# Patient Record
Sex: Female | Born: 1960 | Race: Asian | Hispanic: No | Smoking: Never smoker
Health system: Southern US, Community
[De-identification: ages and names within clinical notes are randomized; demographics above are authoritative.]

## PROBLEM LIST (undated history)

## (undated) DIAGNOSIS — IMO0002 Reserved for concepts with insufficient information to code with codable children: Secondary | ICD-10-CM

## (undated) DIAGNOSIS — I10 Essential (primary) hypertension: Secondary | ICD-10-CM

## (undated) DIAGNOSIS — D649 Anemia, unspecified: Secondary | ICD-10-CM

## (undated) DIAGNOSIS — G43909 Migraine, unspecified, not intractable, without status migrainosus: Secondary | ICD-10-CM

## (undated) DIAGNOSIS — Z973 Presence of spectacles and contact lenses: Secondary | ICD-10-CM

## (undated) DIAGNOSIS — M797 Fibromyalgia: Secondary | ICD-10-CM

## (undated) HISTORY — DX: Reserved for concepts with insufficient information to code with codable children: IMO0002

## (undated) HISTORY — PX: OTHER SURGICAL HISTORY: SHX169

## (undated) HISTORY — PX: COLONOSCOPY W/ POLYPECTOMY: SHX1380

## (undated) HISTORY — DX: Anemia, unspecified: D64.9

## (undated) HISTORY — DX: Presence of spectacles and contact lenses: Z97.3

## (undated) HISTORY — PX: APPENDECTOMY: SHX54

## (undated) HISTORY — DX: Migraine, unspecified, not intractable, without status migrainosus: G43.909

## (undated) HISTORY — PX: TUBAL LIGATION: SHX77

## (undated) HISTORY — DX: Fibromyalgia: M79.7

---

## 1999-04-08 ENCOUNTER — Ambulatory Visit (HOSPITAL_COMMUNITY): Admission: RE | Admit: 1999-04-08 | Discharge: 1999-04-08 | Payer: Self-pay | Admitting: Gastroenterology

## 2000-03-24 ENCOUNTER — Encounter: Admission: RE | Admit: 2000-03-24 | Discharge: 2000-03-24 | Payer: Self-pay | Admitting: Family Medicine

## 2000-03-24 ENCOUNTER — Encounter: Payer: Self-pay | Admitting: Family Medicine

## 2000-03-31 ENCOUNTER — Encounter: Payer: Self-pay | Admitting: Family Medicine

## 2000-03-31 ENCOUNTER — Encounter (INDEPENDENT_AMBULATORY_CARE_PROVIDER_SITE_OTHER): Payer: Self-pay | Admitting: Specialist

## 2000-03-31 ENCOUNTER — Encounter: Admission: RE | Admit: 2000-03-31 | Discharge: 2000-03-31 | Payer: Self-pay | Admitting: Family Medicine

## 2000-03-31 ENCOUNTER — Other Ambulatory Visit: Admission: RE | Admit: 2000-03-31 | Discharge: 2000-03-31 | Payer: Self-pay | Admitting: Family Medicine

## 2000-04-19 ENCOUNTER — Encounter: Payer: Self-pay | Admitting: Family Medicine

## 2000-04-19 ENCOUNTER — Encounter: Admission: RE | Admit: 2000-04-19 | Discharge: 2000-04-19 | Payer: Self-pay | Admitting: Family Medicine

## 2000-08-10 ENCOUNTER — Encounter: Payer: Self-pay | Admitting: Family Medicine

## 2000-08-10 ENCOUNTER — Encounter: Admission: RE | Admit: 2000-08-10 | Discharge: 2000-08-10 | Payer: Self-pay | Admitting: Family Medicine

## 2001-09-06 ENCOUNTER — Ambulatory Visit (HOSPITAL_COMMUNITY): Admission: RE | Admit: 2001-09-06 | Discharge: 2001-09-06 | Payer: Self-pay | Admitting: Gastroenterology

## 2002-03-22 ENCOUNTER — Encounter: Admission: RE | Admit: 2002-03-22 | Discharge: 2002-03-22 | Payer: Self-pay | Admitting: Family Medicine

## 2002-03-22 ENCOUNTER — Encounter: Payer: Self-pay | Admitting: Family Medicine

## 2004-02-23 ENCOUNTER — Ambulatory Visit (HOSPITAL_COMMUNITY): Admission: RE | Admit: 2004-02-23 | Discharge: 2004-02-23 | Payer: Self-pay | Admitting: Family Medicine

## 2004-03-19 ENCOUNTER — Ambulatory Visit (HOSPITAL_COMMUNITY): Admission: RE | Admit: 2004-03-19 | Discharge: 2004-03-19 | Payer: Self-pay | Admitting: Neurology

## 2004-04-07 ENCOUNTER — Encounter: Admission: RE | Admit: 2004-04-07 | Discharge: 2004-07-05 | Payer: Self-pay | Admitting: Neurology

## 2006-04-12 ENCOUNTER — Ambulatory Visit (HOSPITAL_COMMUNITY): Admission: RE | Admit: 2006-04-12 | Discharge: 2006-04-12 | Payer: Self-pay | Admitting: Family Medicine

## 2006-04-28 ENCOUNTER — Encounter: Admission: RE | Admit: 2006-04-28 | Discharge: 2006-04-28 | Payer: Self-pay | Admitting: Neurology

## 2007-11-08 ENCOUNTER — Encounter: Admission: RE | Admit: 2007-11-08 | Discharge: 2007-11-08 | Payer: Self-pay | Admitting: Family Medicine

## 2007-11-09 ENCOUNTER — Encounter: Admission: RE | Admit: 2007-11-09 | Discharge: 2007-11-09 | Payer: Self-pay | Admitting: Family Medicine

## 2007-11-09 ENCOUNTER — Encounter (INDEPENDENT_AMBULATORY_CARE_PROVIDER_SITE_OTHER): Payer: Self-pay | Admitting: Diagnostic Radiology

## 2007-11-14 ENCOUNTER — Other Ambulatory Visit: Admission: RE | Admit: 2007-11-14 | Discharge: 2007-11-14 | Payer: Self-pay | Admitting: Family Medicine

## 2009-12-01 ENCOUNTER — Ambulatory Visit: Payer: Self-pay | Admitting: Family Medicine

## 2009-12-01 DIAGNOSIS — R51 Headache: Secondary | ICD-10-CM

## 2009-12-01 DIAGNOSIS — E119 Type 2 diabetes mellitus without complications: Secondary | ICD-10-CM | POA: Insufficient documentation

## 2009-12-01 DIAGNOSIS — G43909 Migraine, unspecified, not intractable, without status migrainosus: Secondary | ICD-10-CM | POA: Insufficient documentation

## 2009-12-01 DIAGNOSIS — R519 Headache, unspecified: Secondary | ICD-10-CM | POA: Insufficient documentation

## 2010-07-27 NOTE — Assessment & Plan Note (Signed)
Summary: L eye pain, red, irritated x 1 dy rm 3   Vital Signs:  Patient Profile:   50 Years Old Female CC:      Eyes - red, irritated, painful Height:     62 inches Weight:      182 pounds O2 Sat:      100 % O2 treatment:    Room Air Temp:     97.3 degrees F oral Pulse rate:   87 / minute Pulse rhythm:   regular Resp:     16 per minute BP sitting:   135 / 87  (right arm) Cuff size:   regular  Vitals Entered By: Areta Haber CMA (December 01, 2009 11:53 AM)                  Current Allergies: No known allergies History of Present Illness Chief Complaint: Eyes - red, irritated, painful History of Present Illness: 50yo female c/o red and mild-mod irritated left eye, started yesterday.  She doesn't recall what could have caused the problem.  No h/o flying debris or other trauma.  She wears glass, but not contacts.  No exposure to children or other sick individuals.  The left eye was crusty, red, and irritated this morning and now the right eye is starting to feel the same way.  No other URI symptoms or other illnesses at this point.  No OTC treatment.  Current Problems: CONJUNCTIVITIS (ICD-372.30) FAMILY HISTORY DIABETES 1ST DEGREE RELATIVE (ICD-V18.0) HEADACHE (ICD-784.0) DIABETES MELLITUS, TYPE II (ICD-250.00)   Current Meds TOPAMAX 25 MG TABS (TOPIRAMATE) 1 tab by mouth two times a day * LODINE MG as needed CILOXAN 0.3 % SOLN (CIPROFLOXACIN HCL) 1-2 gtt every 3-6 hours for 10 days in both eyes  REVIEW OF SYSTEMS Constitutional Symptoms      Denies fever, chills, night sweats, weight loss, weight gain, and fatigue.  Eyes       Complains of eye pain and eye drainage.      Denies change in vision, glasses, contact lenses, and eye surgery.      Comments: L eye x 1 dy Ear/Nose/Throat/Mouth       Denies hearing loss/aids, change in hearing, ear pain, ear discharge, dizziness, frequent runny nose, frequent nose bleeds, sinus problems, sore throat, hoarseness, and tooth pain  or bleeding.  Respiratory       Denies dry cough, productive cough, wheezing, shortness of breath, asthma, bronchitis, and emphysema/COPD.  Cardiovascular       Denies murmurs, chest pain, and tires easily with exhertion.    Gastrointestinal       Denies stomach pain, nausea/vomiting, diarrhea, constipation, blood in bowel movements, and indigestion. Genitourniary       Denies painful urination, kidney stones, and loss of urinary control. Neurological       Denies paralysis, seizures, and fainting/blackouts. Musculoskeletal       Denies muscle pain, joint pain, joint stiffness, decreased range of motion, redness, swelling, muscle weakness, and gout.  Skin       Denies bruising, unusual mles/lumps or sores, and hair/skin or nail changes.  Psych       Denies mood changes, temper/anger issues, anxiety/stress, speech problems, depression, and sleep problems.  Past History:  Past Medical History: Diabetes mellitus, type II Headache Fibromyalgia  Past Surgical History: Denies surgical history  Family History: Family History Diabetes 1st degree relative Family History Other cancer Family History of Cardiovascular disorder  Social History: Widow/Widower Never Smoked Alcohol use-no Drug use-no Regular  exercise-yes Smoking Status:  never Drug Use:  no Does Patient Exercise:  yes Physical Exam General appearance: well developed, well nourished, no acute distress Head: normocephalic, atraumatic Eyes: Right eye normal.  Left eye with injected conjunctiva and partial scleral erythema, PERRLA, EOMI, no foreign bodies Chest/Lungs: no rales, wheezes, or rhonchi bilateral, breath sounds equal without effort Heart: regular rate and  rhythm, no murmur Assessment New Problems: CONJUNCTIVITIS (ICD-372.30) FAMILY HISTORY DIABETES 1ST DEGREE RELATIVE (ICD-V18.0) HEADACHE (ICD-784.0) DIABETES MELLITUS, TYPE II (ICD-250.00)   Plan New Orders: New Patient Level III [99203] Planning  Comments:   Avoid rubbing eyes, throw away eye makeup, wash hands frequently as it is very contagious, warm compresses  Follow Up: Follow up in 2-3 days if no improvement Work Excuse: no work today  The patient and/or caregiver has been counseled thoroughly with regard to medications prescribed including dosage, schedule, interactions, rationale for use, and possible side effects and they verbalize understanding.  Diagnoses and expected course of recovery discussed and will return if not improved as expected or if the condition worsens. Patient and/or caregiver verbalized understanding.   Orders Added: 1)  New Patient Level III [91478]

## 2010-11-12 NOTE — Procedures (Signed)
[place]  Patient:    Samantha Price, Samantha Price Visit Number: [id number] MRN: [id number]          Service Type: END Location: ENDO Attending Physician:  Orland Mustard Dictated by:   Llana Aliment. Randa Evens, M.D. Proc. Date: 09/06/01 Admit Date:  09/06/2001   CC:         Consuella Lose C. Valentina Lucks, M.D.   Procedure Report  DATE OF BIRTH:  [birth year]  PROCEDURE:  Colonoscopy.  SURGEON:  James L. Edwards, M.D.  MEDICATIONS:  Fentanyl 75 mcg and Versed 7 mg IV.  SCOPE:  Pediatric Olympus colonoscope.  INDICATIONS:  Previous history of multiple colon polyps while living in [place].  DESCRIPTION OF PROCEDURE:  The procedure had been explained to the patient and consent obtained.  With the patient in the left lateral decubitus position, the Olympus colonoscope was inserted and advanced under direct visualization. The scope was withdrawn and the cecum, ascending colon, hepatic flexure, transverse colon, splenic flexure, descending, and sigmoid colon were seen well upon removal.  No polyps were seen.  The patient was tolerated the procedure well and was maintained on low flow oxygen and pulse oximeter throughout the procedure.  ASSESSMENT:  No evidence of further polyps.  PLAN:  Recommend repeating in three years given her previous history of polyps. Dictated by:   Llana Aliment. Randa Evens, M.D. Attending Physician:  Orland Mustard DD:  09/06/01 TD:  09/07/01 Job: [id number]

## 2010-11-15 ENCOUNTER — Other Ambulatory Visit: Payer: Self-pay | Admitting: Emergency Medicine

## 2010-11-15 ENCOUNTER — Encounter: Payer: Self-pay | Admitting: Emergency Medicine

## 2010-11-15 ENCOUNTER — Telehealth (INDEPENDENT_AMBULATORY_CARE_PROVIDER_SITE_OTHER): Payer: Self-pay | Admitting: *Deleted

## 2010-11-15 ENCOUNTER — Inpatient Hospital Stay (INDEPENDENT_AMBULATORY_CARE_PROVIDER_SITE_OTHER)
Admission: RE | Admit: 2010-11-15 | Discharge: 2010-11-15 | Disposition: A | Discharge status: Home / Self Care | Payer: BC Managed Care – PPO | Source: Ambulatory Visit | Attending: Emergency Medicine | Admitting: Emergency Medicine

## 2010-11-15 ENCOUNTER — Ambulatory Visit
Admission: RE | Admit: 2010-11-15 | Discharge: 2010-11-15 | Disposition: A | Discharge status: Home / Self Care | Payer: BC Managed Care – PPO | Source: Ambulatory Visit | Attending: Emergency Medicine | Admitting: Emergency Medicine

## 2010-11-15 DIAGNOSIS — D492 Neoplasm of unspecified behavior of bone, soft tissue, and skin: Secondary | ICD-10-CM | POA: Insufficient documentation

## 2010-11-19 ENCOUNTER — Other Ambulatory Visit (INDEPENDENT_AMBULATORY_CARE_PROVIDER_SITE_OTHER): Payer: Self-pay | Admitting: Surgery

## 2010-11-19 DIAGNOSIS — IMO0002 Reserved for concepts with insufficient information to code with codable children: Secondary | ICD-10-CM

## 2010-11-20 ENCOUNTER — Ambulatory Visit
Admission: RE | Admit: 2010-11-20 | Discharge: 2010-11-20 | Disposition: A | Discharge status: Home / Self Care | Payer: BC Managed Care – PPO | Source: Ambulatory Visit | Attending: Surgery | Admitting: Surgery

## 2010-11-20 DIAGNOSIS — IMO0002 Reserved for concepts with insufficient information to code with codable children: Secondary | ICD-10-CM

## 2010-11-22 ENCOUNTER — Encounter: Payer: Self-pay | Admitting: Family Medicine

## 2010-11-24 ENCOUNTER — Other Ambulatory Visit (INDEPENDENT_AMBULATORY_CARE_PROVIDER_SITE_OTHER): Payer: Self-pay | Admitting: Surgery

## 2010-11-24 DIAGNOSIS — IMO0002 Reserved for concepts with insufficient information to code with codable children: Secondary | ICD-10-CM

## 2010-11-26 ENCOUNTER — Ambulatory Visit (INDEPENDENT_AMBULATORY_CARE_PROVIDER_SITE_OTHER): Payer: BC Managed Care – PPO | Admitting: Family Medicine

## 2010-11-26 ENCOUNTER — Encounter: Payer: Self-pay | Admitting: Family Medicine

## 2010-11-26 DIAGNOSIS — Z1231 Encounter for screening mammogram for malignant neoplasm of breast: Secondary | ICD-10-CM

## 2010-11-26 DIAGNOSIS — G43909 Migraine, unspecified, not intractable, without status migrainosus: Secondary | ICD-10-CM

## 2010-11-26 DIAGNOSIS — I10 Essential (primary) hypertension: Secondary | ICD-10-CM

## 2010-11-26 DIAGNOSIS — E119 Type 2 diabetes mellitus without complications: Secondary | ICD-10-CM

## 2010-11-26 DIAGNOSIS — Z23 Encounter for immunization: Secondary | ICD-10-CM

## 2010-11-26 DIAGNOSIS — Z1211 Encounter for screening for malignant neoplasm of colon: Secondary | ICD-10-CM

## 2010-11-26 MED ORDER — TOPIRAMATE 25 MG PO CPSP: 25.0000 mg | ORAL_CAPSULE | Freq: Two times a day (BID) | ORAL | Status: DC

## 2010-11-26 MED ORDER — LISINOPRIL-HYDROCHLOROTHIAZIDE 10-12.5 MG PO TABS: 1.0000 | ORAL_TABLET | Freq: Every day | ORAL | Status: DC

## 2010-11-26 NOTE — Assessment & Plan Note (Addendum)
New dx. Will start medication.Discussed potential SE of the medication. Start lisinopril HCT. Can start with half tab for the first week if nervous about it.  Follow up in 1 months. Discussed the DASH diet. Work on weight loss and regular exercise. Due for screening labs and check TSH to make sure no hypothyroid.

## 2010-11-26 NOTE — Progress Notes (Signed)
Subjective:    Patient ID: Samantha Price, female    DOB: 07/19/1960, 50 y.o.   MRN: [id number]  HPI  Samantha Price to UC for some pain and for a lump on her right upper thigh, that has been getting larger. She is getting an MRI per the surgeon for this but they noted that her BP was high at the UC.  Has been under a lot stress for the last 3 years.  Father died of cancer and mom died in accident.  Husband died in 11/16/2008 from suicide. Did do some counseling for awhile but recently started back to work.  She has gained about 40 -50 lbs in the labs in the last few years.   Hx of diet controlled diabetes.  She is getting labs drawn tomorrow to have her MRI done.  Her last mammo was in Nov 17, 2007, last colonoscopy was in Nov 16, 2005 and has hx of polyps so was supposed to repeat in 5 years. Used to see Dr. Randa Evens but would like to find someone locally.    She needs a refill on her topomax for he rmigraines.   Review of Systems  Constitutional: Negative for fever, diaphoresis and unexpected weight change.  HENT: Negative for hearing loss, rhinorrhea and tinnitus.   Eyes: Negative for visual disturbance.  Respiratory: Negative for cough and wheezing.   Cardiovascular: Negative for chest pain and palpitations.  Gastrointestinal: Negative for nausea, vomiting, diarrhea and blood in stool.  Genitourinary: Negative for vaginal bleeding, vaginal discharge and difficulty urinating.  Musculoskeletal: Negative for myalgias and arthralgias.  Skin: Negative for rash.  Neurological: Positive for headaches.  Hematological: Negative for adenopathy. Bruises/bleeds easily.       [Unexplained lump on the right upper thigh Psychiatric/Behavioral: Negative for sleep disturbance and dysphoric mood. The patient is not nervous/anxious.    BP 156/88  Pulse 74  Ht 5' 1.5" (1.562 m)  Wt 188 lb (85.276 kg)  BMI 34.95 kg/m2    Not on File  Past Medical History  Diagnosis Date  . Diabetes mellitus     type 2  . Migraines     . Fibromyalgia   . Cyst     drained from Rt breast    Past Surgical History  Procedure Date  . Appendectomy   . Breast cyst excison     Left,   . Tubal ligation   . Colonoscopy w/ polypectomy     2005/11/16, Dr. Randa Evens.      History   Social History  . Marital Status: Widowed    Spouse Name: Jeannett Senior     Number of Children: 3  . 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 Smoking status: Never Smoker   . Smokeless tobacco: Not on file  . Alcohol Use: Yes  . Drug Use: Yes  . Sexually Active: Not on file     widow, regularly exercises   Other Topics Concern  . Not on file   Social History Narrative   Her youngest daughter lives at home. Has 3 daughters.  Her husband committed suicide in 2008/11/16 from PTSD, and mother died 3 months prior to that.  Mom died in a tragik accident.     Family History  Problem Relation Age of Onset  . Cancer Father     stomach  . Diabetes Mother   . Colon cancer Other     aunt, cousin  .  Heart disease Other     family hisatory  . Leukemia      1st cousin  . Breast cancer      aunt  . Stroke      Grandparent    Current outpatient prescriptions:lisinopril-hydrochlorothiazide (PRINZIDE,ZESTORETIC) 10-12.5 MG per tablet, Take 1 tablet by mouth daily., Disp: 30 tablet, Rfl: 1;  topiramate (TOPAMAX) 25 MG capsule, Take 1 capsule (25 mg total) by mouth 2 (two) times daily., Disp: 60 capsule, Rfl: 6;  DISCONTD: topiramate (TOPAMAX) 25 MG capsule, Take 25 mg by mouth 2 (two) times daily.  , Disp: , Rfl:      Objective:   Physical Exam  Constitutional: She is oriented to person, place, and time. She appears well-developed and well-nourished.  HENT:  Head: Normocephalic and atraumatic.  Eyes: Pupils are equal, round, and reactive to light.  Neck: Neck supple. No thyromegaly present.  Cardiovascular: Normal rate, regular rhythm and normal heart sounds.         No carotid or abdominal bruits.   Pulmonary/Chest: Effort normal and breath sounds normal.  Musculoskeletal: She exhibits no edema.  Lymphadenopathy:    She has no cervical adenopathy.  Neurological: She is alert and oriented to person, place, and time.  Skin: Skin is warm and dry.  Psychiatric: She has a normal mood and affect.          Assessment & Plan:  She is  Very behind in her health maintenance so will refer for mammo, colonosocpy, and given Tdap today.  She also needs screening labs.

## 2010-11-26 NOTE — Assessment & Plan Note (Signed)
Will refill her topamax.

## 2010-11-26 NOTE — Assessment & Plan Note (Addendum)
Due to recheck. Hasn't been checked in a long time. I suspect it is worse since has gained almost 40 lbs.

## 2010-11-26 NOTE — Patient Instructions (Addendum)
Google DASH diet (.nih/gov) Work on regular exercise and low salt and low fat diet We will call you to schedule your mammogram and colonoscopy.

## 2010-11-27 ENCOUNTER — Other Ambulatory Visit: Payer: BC Managed Care – PPO

## 2010-11-27 ENCOUNTER — Ambulatory Visit
Admission: RE | Admit: 2010-11-27 | Discharge: 2010-11-27 | Disposition: A | Discharge status: Home / Self Care | Payer: BC Managed Care – PPO | Source: Ambulatory Visit | Attending: Surgery | Admitting: Surgery

## 2010-11-27 DIAGNOSIS — IMO0002 Reserved for concepts with insufficient information to code with codable children: Secondary | ICD-10-CM

## 2010-11-27 MED ORDER — GADOBENATE DIMEGLUMINE 529 MG/ML IV SOLN
17.0000 mL | Freq: Once | INTRAVENOUS | Status: AC | PRN
  Administered 2010-11-27: 17 mL via INTRAVENOUS

## 2010-11-29 ENCOUNTER — Telehealth: Payer: Self-pay | Admitting: Family Medicine

## 2010-11-29 DIAGNOSIS — Z1231 Encounter for screening mammogram for malignant neoplasm of breast: Secondary | ICD-10-CM

## 2010-11-29 DIAGNOSIS — N6310 Unspecified lump in the right breast, unspecified quadrant: Secondary | ICD-10-CM

## 2010-11-29 NOTE — Telephone Encounter (Signed)
Samantha Price with the [facility] called and pt order placed in "EPIC" entered as screen mammo needs to be changed to Diagnostic bilateral mammo since pt is to be follow up from 2009. Plan:  Routed this message to provider Dr. Marlyne Beards, LPN [facility]

## 2010-11-29 NOTE — Telephone Encounter (Signed)
Ok will change 

## 2010-11-29 NOTE — Telephone Encounter (Signed)
Noted that provider will change the order in the system to the correct test. Jarvis Newcomer, LPN [facility]

## 2010-11-30 ENCOUNTER — Other Ambulatory Visit (INDEPENDENT_AMBULATORY_CARE_PROVIDER_SITE_OTHER): Payer: Self-pay | Admitting: Surgery

## 2010-11-30 ENCOUNTER — Other Ambulatory Visit: Payer: Self-pay | Admitting: Family Medicine

## 2010-11-30 DIAGNOSIS — N6310 Unspecified lump in the right breast, unspecified quadrant: Secondary | ICD-10-CM

## 2010-12-03 ENCOUNTER — Encounter (INDEPENDENT_AMBULATORY_CARE_PROVIDER_SITE_OTHER): Payer: Self-pay | Admitting: Surgery

## 2010-12-08 ENCOUNTER — Ambulatory Visit
Admission: RE | Admit: 2010-12-08 | Discharge: 2010-12-08 | Disposition: A | Discharge status: Home / Self Care | Payer: BC Managed Care – PPO | Source: Ambulatory Visit | Attending: Family Medicine | Admitting: Family Medicine

## 2010-12-08 DIAGNOSIS — N6310 Unspecified lump in the right breast, unspecified quadrant: Secondary | ICD-10-CM

## 2010-12-24 ENCOUNTER — Ambulatory Visit (HOSPITAL_COMMUNITY)
Admission: RE | Admit: 2010-12-24 | Discharge: 2010-12-24 | Disposition: A | Discharge status: Home / Self Care | Payer: BC Managed Care – PPO | Source: Ambulatory Visit | Attending: Orthopaedic Surgery | Admitting: Orthopaedic Surgery

## 2010-12-24 ENCOUNTER — Encounter (HOSPITAL_COMMUNITY)
Admission: RE | Admit: 2010-12-24 | Discharge: 2010-12-24 | Disposition: A | Discharge status: Home / Self Care | Payer: BC Managed Care – PPO | Source: Ambulatory Visit | Attending: Orthopaedic Surgery | Admitting: Orthopaedic Surgery

## 2010-12-24 ENCOUNTER — Other Ambulatory Visit (HOSPITAL_COMMUNITY): Payer: Self-pay | Admitting: Orthopaedic Surgery

## 2010-12-24 DIAGNOSIS — Z01810 Encounter for preprocedural cardiovascular examination: Secondary | ICD-10-CM | POA: Insufficient documentation

## 2010-12-24 DIAGNOSIS — R2240 Localized swelling, mass and lump, unspecified lower limb: Secondary | ICD-10-CM

## 2010-12-24 DIAGNOSIS — Z01818 Encounter for other preprocedural examination: Secondary | ICD-10-CM | POA: Insufficient documentation

## 2010-12-24 DIAGNOSIS — Z01812 Encounter for preprocedural laboratory examination: Secondary | ICD-10-CM | POA: Insufficient documentation

## 2010-12-24 DIAGNOSIS — R229 Localized swelling, mass and lump, unspecified: Secondary | ICD-10-CM | POA: Insufficient documentation

## 2010-12-24 LAB — CBC
HCT: 39.6 % (ref 36.0–46.0)
Hemoglobin: 13.5 g/dL (ref 12.0–15.0)
MCH: 30.8 pg (ref 26.0–34.0)
MCHC: 34.1 g/dL (ref 30.0–36.0)
MCV: 90.4 fL (ref 78.0–100.0)

## 2010-12-24 LAB — BASIC METABOLIC PANEL
BUN: 7 mg/dL (ref 6–23)
Calcium: 9 mg/dL (ref 8.4–10.5)
Creatinine, Ser: 0.61 mg/dL (ref 0.50–1.10)
GFR calc non Af Amer: >60 mL/min (ref >60)
Glucose, Bld: 132 mg/dL — ABNORMAL HIGH (ref 70–99)
Sodium: 140 mEq/L (ref 135–145)

## 2010-12-27 ENCOUNTER — Ambulatory Visit: Payer: BC Managed Care – PPO | Admitting: Family Medicine

## 2010-12-28 ENCOUNTER — Other Ambulatory Visit: Payer: Self-pay | Admitting: Orthopaedic Surgery

## 2010-12-28 ENCOUNTER — Ambulatory Visit (HOSPITAL_COMMUNITY)
Admission: RE | Admit: 2010-12-28 | Discharge: 2010-12-29 | Disposition: A | Discharge status: Home / Self Care | Payer: BC Managed Care – PPO | Source: Ambulatory Visit | Attending: Orthopaedic Surgery | Admitting: Orthopaedic Surgery

## 2010-12-28 DIAGNOSIS — G4733 Obstructive sleep apnea (adult) (pediatric): Secondary | ICD-10-CM | POA: Insufficient documentation

## 2010-12-28 DIAGNOSIS — I10 Essential (primary) hypertension: Secondary | ICD-10-CM | POA: Insufficient documentation

## 2010-12-28 DIAGNOSIS — Z01812 Encounter for preprocedural laboratory examination: Secondary | ICD-10-CM | POA: Insufficient documentation

## 2010-12-28 DIAGNOSIS — D1779 Benign lipomatous neoplasm of other sites: Secondary | ICD-10-CM | POA: Insufficient documentation

## 2010-12-28 DIAGNOSIS — Z01818 Encounter for other preprocedural examination: Secondary | ICD-10-CM | POA: Insufficient documentation

## 2010-12-28 DIAGNOSIS — Z01810 Encounter for preprocedural cardiovascular examination: Secondary | ICD-10-CM | POA: Insufficient documentation

## 2010-12-28 DIAGNOSIS — E119 Type 2 diabetes mellitus without complications: Secondary | ICD-10-CM | POA: Insufficient documentation

## 2010-12-28 LAB — GLUCOSE, CAPILLARY

## 2010-12-29 LAB — GLUCOSE, CAPILLARY
Glucose-Capillary: 138 mg/dL — ABNORMAL HIGH (ref 70–99)
Glucose-Capillary: 120 mg/dL — ABNORMAL HIGH (ref 70–99)

## 2010-12-31 NOTE — Op Note (Signed)
  [name], CAST           ACCOUNT NO.:  [id number]  MEDICAL RECORD NO.:  [id number]  LOCATION:  5013                         FACILITY:  [facility]  PHYSICIAN:  Vanita Panda. Magnus Ivan, M.D.DATE OF BIRTH:  09/28/1960  DATE OF PROCEDURE:  12/28/2010 DATE OF DISCHARGE:                              OPERATIVE REPORT   PREOPERATIVE DIAGNOSIS:  Large right thigh intramuscular mass.  POSTOPERATIVE DIAGNOSIS:  Large right thigh intramuscular mass.  PROCEDURE:  Excision of large and deep right thigh intramuscular mass.  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  SURGEON:  Vanita Panda. Magnus Ivan, MD  ANESTHESIA: 1. General. 2. Local 0.25% plain Sensorcaine.  ANTIBIOTICS:  2 g IV Ancef.  ESTIMATED BLOOD LOSS:  50 mL.  COMPLICATIONS:  None.  INDICATIONS:  [name] is a very pleasant 50 year old female with a slowly growing mass deep in her right thigh.  She eventually had an MRI obtained, which showed findings consistent with a intramuscular lipoma that was rather large.  This was slowly growing and this was becoming painful as well.  She wished to have this removed.  The risks and benefits of surgery were explained to her in detail and she did wish to proceed with surgery.  PROCEDURE DESCRIPTION:  After informed consent was obtained, appropriate right thigh was marked.  She was brought to the operating room placed supine on the operating table.  General anesthesia was then obtained, we then prepped her right thigh from the lower abdomen groin area all the way down to the ankle with DuraPrep and sterile drapes.  A sterile stockinette was placed as well. A time-out was called to identify the correct patient and the correct right thigh.  I then made an incision. The anterior medial aspect of the thigh midway down centered over where I felt the lipoma resided.  I dissected down to the intramuscular plain. I was able to identify the  vastus medialis as well as the sartorius and as I got in to the subfascial plains.  I found a large lipomas appearing mass.  We were able to meticulously dissect around this mass in its entirety and remove it with leaving no gross lipomas appearing tissue remaining.  This did appear to be consistent with benign lipoma.  We did sent for frozen section and the pathologist stated that it was difficult to freeze and the gross findings consistent with lipoma.  The permanent specimen was then sent to follow.  I then copiously irrigated the tissue.  Hemostasis was obtained with electrocautery.  I closed the deep tissue with interrupted 0 Vicryl  suture followed by 2-0 Vicryl  and subcutaneous tissue and a 4-0 running Monocryl subcuticular stitch.  The incision was then infiltrated with 0.25% plain Sensorcaine.  I then placed a Steri- Strips and well-padded sterile dressing.  The patient was awakened, extubated, and taken to recovery room in stable condition.  All final counts were correct and there were no complications noted.     Vanita Panda. Magnus Ivan, M.D.     CYB/MEDQ  D:  12/28/2010  T:  12/29/2010  Job:  [id number]  Electronically Signed by Doneen Poisson M.D. on 12/31/2010 06:30:37 PM

## 2010-12-31 NOTE — H&P (Signed)
  [name], Samantha Price           ACCOUNT NO.:  [id number]  MEDICAL RECORD NO.:  [id number]  LOCATION:  5013                         FACILITY:  [facility]  PHYSICIAN:  Vanita Panda. Magnus Ivan, M.D.DATE OF BIRTH:  [birth year]  DATE OF ADMISSION:  12/28/2010 DATE OF DISCHARGE:  12/24/2010                             HISTORY & PHYSICAL   CHIEF COMPLAINT:  Swelling and expanding right thigh mass.  HISTORY AND PHYSICAL:  Ms. Samantha Price is a very pleasant 50 year old female with a right thigh mass.  She was referred from Dr. Manus Rudd of [facility] who evaluated this and MRI was consistent with what appear to be a benign large lipoma.  She states she feels like it is getting bigger.  She says it does hurt as well.  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  PAST MEDICAL HISTORY:  Diabetes that is well controlled and high blood pressure.  ALLERGIES:  No known drug allergies.  CURRENT MEDICATIONS:  Topamax, multivitamin, and aspirin.  SOCIAL HISTORY:  She is widowed as well.  She has a daughter who is a Holiday representative at [employer] and plays soccer.  REVIEW OF SYSTEMS:  Negative for chest pain, shortness of breath, fevers, chills, nausea, or vomiting.  PHYSICAL EXAMINATION:  VITAL SIGNS:  She is afebrile.  Stable vital signs. GENERAL:  She is alert and oriented x3, in no acute distress,  but in obvious discomfort. HEENT:  Normocephalic and atraumatic.  Pupils equal, round, and reactive to light.  Extraocular muscles intact. NECK:  Supple. LUNGS:  Clear to auscultation bilaterally. HEART:  Regular rate and rhythm. ABDOMEN:  Benign. EXTREMITIES:  Right leg does shows some differentiation in spasm comparing Samantha right and left side.  There is no warmth.  I can easily palpate this large mass in Samantha anterior compartment of Samantha thigh just medial to Samantha midline.  She is neurovascularly intact.  MRIs reviewed and  is reviewed thoroughly and it shows there is a large expansile mass of Samantha anterior compartment looks to be associated with vastus medialis muscle around Samantha vastus intermedius.  It is consistent with surrounding fat tissue and is homogenous signal throughout consistent with a benign process such as a lipoma.  It is close to Samantha neurovascular bundle as well.  ASSESSMENT:  Large right benign appearing thigh mass.  PLAN:  We are going to proceed with surgery today to excise this.  We will take our time doing this to hopefully get __________ good margins. We will send for frozen section and then a permanent section as well. She understands Samantha risks and benefits of this and does wish to proceed with surgery.     Vanita Panda. Magnus Ivan, M.D.     CYB/MEDQ  D:  12/28/2010  T:  12/28/2010  Job:  [id number]  Electronically Signed by Doneen Poisson M.D. on 12/31/2010 06:30:30 PM

## 2011-01-05 ENCOUNTER — Other Ambulatory Visit: Payer: Self-pay | Admitting: Orthopaedic Surgery

## 2011-01-05 ENCOUNTER — Ambulatory Visit
Admission: RE | Admit: 2011-01-05 | Discharge: 2011-01-05 | Disposition: A | Discharge status: Home / Self Care | Payer: BC Managed Care – PPO | Source: Ambulatory Visit | Attending: Orthopaedic Surgery | Admitting: Orthopaedic Surgery

## 2011-01-05 DIAGNOSIS — R609 Edema, unspecified: Secondary | ICD-10-CM

## 2011-02-08 ENCOUNTER — Ambulatory Visit: Payer: BC Managed Care – PPO | Attending: Orthopaedic Surgery | Admitting: Physical Therapy

## 2011-02-08 DIAGNOSIS — M2550 Pain in unspecified joint: Secondary | ICD-10-CM | POA: Insufficient documentation

## 2011-02-08 DIAGNOSIS — M6281 Muscle weakness (generalized): Secondary | ICD-10-CM | POA: Insufficient documentation

## 2011-02-08 DIAGNOSIS — IMO0001 Reserved for inherently not codable concepts without codable children: Secondary | ICD-10-CM | POA: Insufficient documentation

## 2011-02-08 DIAGNOSIS — M2560 Stiffness of unspecified joint, not elsewhere classified: Secondary | ICD-10-CM | POA: Insufficient documentation

## 2011-02-08 DIAGNOSIS — R262 Difficulty in walking, not elsewhere classified: Secondary | ICD-10-CM | POA: Insufficient documentation

## 2011-02-09 ENCOUNTER — Ambulatory Visit: Payer: BC Managed Care – PPO | Admitting: Physical Therapy

## 2011-02-10 ENCOUNTER — Ambulatory Visit: Payer: BC Managed Care – PPO | Admitting: Physical Therapy

## 2011-02-14 ENCOUNTER — Ambulatory Visit: Payer: BC Managed Care – PPO | Admitting: Physical Therapy

## 2011-02-16 ENCOUNTER — Ambulatory Visit: Payer: BC Managed Care – PPO | Admitting: Physical Therapy

## 2011-02-18 ENCOUNTER — Ambulatory Visit: Payer: BC Managed Care – PPO | Admitting: Physical Therapy

## 2011-02-21 ENCOUNTER — Ambulatory Visit: Payer: BC Managed Care – PPO | Admitting: Physical Therapy

## 2011-02-23 ENCOUNTER — Encounter: Payer: BC Managed Care – PPO | Admitting: Physical Therapy

## 2011-02-24 ENCOUNTER — Encounter: Payer: BC Managed Care – PPO | Admitting: Physical Therapy

## 2011-02-25 ENCOUNTER — Encounter: Payer: BC Managed Care – PPO | Admitting: Physical Therapy

## 2011-03-02 ENCOUNTER — Ambulatory Visit: Payer: BC Managed Care – PPO | Attending: Orthopaedic Surgery | Admitting: Physical Therapy

## 2011-03-02 DIAGNOSIS — R262 Difficulty in walking, not elsewhere classified: Secondary | ICD-10-CM | POA: Insufficient documentation

## 2011-03-02 DIAGNOSIS — IMO0001 Reserved for inherently not codable concepts without codable children: Secondary | ICD-10-CM | POA: Insufficient documentation

## 2011-03-02 DIAGNOSIS — M6281 Muscle weakness (generalized): Secondary | ICD-10-CM | POA: Insufficient documentation

## 2011-03-02 DIAGNOSIS — M2560 Stiffness of unspecified joint, not elsewhere classified: Secondary | ICD-10-CM | POA: Insufficient documentation

## 2011-03-02 DIAGNOSIS — M2550 Pain in unspecified joint: Secondary | ICD-10-CM | POA: Insufficient documentation

## 2011-03-03 ENCOUNTER — Encounter: Payer: BC Managed Care – PPO | Admitting: Physical Therapy

## 2011-03-09 ENCOUNTER — Encounter: Payer: BC Managed Care – PPO | Admitting: Physical Therapy

## 2011-03-10 ENCOUNTER — Ambulatory Visit: Payer: BC Managed Care – PPO | Admitting: Physical Therapy

## 2011-03-14 ENCOUNTER — Ambulatory Visit: Payer: BC Managed Care – PPO | Admitting: Physical Therapy

## 2011-03-17 ENCOUNTER — Encounter: Payer: BC Managed Care – PPO | Admitting: Physical Therapy

## 2011-03-24 ENCOUNTER — Encounter: Payer: BC Managed Care – PPO | Admitting: Physical Therapy

## 2011-04-08 ENCOUNTER — Ambulatory Visit: Payer: BC Managed Care – PPO | Attending: Orthopaedic Surgery | Admitting: Physical Therapy

## 2011-04-08 DIAGNOSIS — M2560 Stiffness of unspecified joint, not elsewhere classified: Secondary | ICD-10-CM | POA: Insufficient documentation

## 2011-04-08 DIAGNOSIS — R262 Difficulty in walking, not elsewhere classified: Secondary | ICD-10-CM | POA: Insufficient documentation

## 2011-04-08 DIAGNOSIS — IMO0001 Reserved for inherently not codable concepts without codable children: Secondary | ICD-10-CM | POA: Insufficient documentation

## 2011-04-08 DIAGNOSIS — M6281 Muscle weakness (generalized): Secondary | ICD-10-CM | POA: Insufficient documentation

## 2011-04-08 DIAGNOSIS — M2550 Pain in unspecified joint: Secondary | ICD-10-CM | POA: Insufficient documentation

## 2011-04-13 ENCOUNTER — Ambulatory Visit: Payer: BC Managed Care – PPO | Admitting: Physical Therapy

## 2011-04-15 ENCOUNTER — Ambulatory Visit: Payer: BC Managed Care – PPO | Admitting: Physical Therapy

## 2011-04-20 ENCOUNTER — Ambulatory Visit: Payer: BC Managed Care – PPO | Admitting: Physical Therapy

## 2011-04-25 ENCOUNTER — Ambulatory Visit: Payer: BC Managed Care – PPO | Admitting: Physical Therapy

## 2011-04-28 ENCOUNTER — Ambulatory Visit: Payer: BC Managed Care – PPO | Attending: Orthopaedic Surgery | Admitting: Physical Therapy

## 2011-04-28 DIAGNOSIS — M2560 Stiffness of unspecified joint, not elsewhere classified: Secondary | ICD-10-CM | POA: Insufficient documentation

## 2011-04-28 DIAGNOSIS — M6281 Muscle weakness (generalized): Secondary | ICD-10-CM | POA: Insufficient documentation

## 2011-04-28 DIAGNOSIS — R262 Difficulty in walking, not elsewhere classified: Secondary | ICD-10-CM | POA: Insufficient documentation

## 2011-04-28 DIAGNOSIS — IMO0001 Reserved for inherently not codable concepts without codable children: Secondary | ICD-10-CM | POA: Insufficient documentation

## 2011-04-28 DIAGNOSIS — M2550 Pain in unspecified joint: Secondary | ICD-10-CM | POA: Insufficient documentation

## 2011-05-30 NOTE — Telephone Encounter (Signed)
  Phone Note Outgoing Call   Call placed by: Lajean Saver RN,  Nov 15, 2010 11:23 AM Call placed to: [facility] Action Taken: Appt scheduled Summary of Call: Scheduled to see Dr. Corliss Skains with [facility] on 11/19/10 @ 10am in [facility] office. Patient notifed of appointment.

## 2011-05-30 NOTE — Progress Notes (Signed)
Summary: LUMP ON RIGHT THIGH?   Vital Signs:  Patient Profile:   50 Years Old Female CC:      Lump to right thigh x 3 months Height:     62 inches Weight:      185 pounds O2 Sat:      96 % O2 treatment:    Room Air Temp:     98.8 degrees F oral Pulse rate:   67 / minute Resp:     16 per minute BP sitting:   171 / 92  (left arm) Cuff size:   large  Pt. in pain?   yes    Location:   right thigh    Type:       sore  Vitals Entered By: Lajean Saver RN (Nov 15, 2010 10:02 AM)                   Updated Prior Medication List: TOPAMAX 25 MG TABS (TOPIRAMATE) 1 tab by mouth two times a day  Current Allergies: No known allergies History of Present Illness History from: patient Chief Complaint: Lump to right thigh x 3 months History of Present Illness: She has had a lump on her R thigh for 3-4 months.  Started small and has seemed to enlarge over time.  It wasn't bothering her, but now she is noticing it to be sore when standing a long time or with activity.  No F/C/N/V.  Not using any heat or cold.  She does have history of multiple breast masses that were biopsied, all were benign but one of which was "indeterminate".    REVIEW OF SYSTEMS Constitutional Symptoms      Denies fever, chills, night sweats, weight loss, weight gain, and fatigue.  Eyes       Denies change in vision, eye pain, eye discharge, glasses, contact lenses, and eye surgery. Ear/Nose/Throat/Mouth       Denies hearing loss/aids, change in hearing, ear pain, ear discharge, dizziness, frequent runny nose, frequent nose bleeds, sinus problems, sore throat, hoarseness, and tooth pain or bleeding.  Respiratory       Denies dry cough, productive cough, wheezing, shortness of breath, asthma, bronchitis, and emphysema/COPD.  Cardiovascular       Denies murmurs, chest pain, and tires easily with exhertion.    Gastrointestinal       Denies stomach pain, nausea/vomiting, diarrhea, constipation, blood in bowel  movements, and indigestion. Genitourniary       Denies painful urination, kidney stones, and loss of urinary control. Neurological       Denies paralysis, seizures, and fainting/blackouts. Musculoskeletal       Denies muscle pain, joint pain, joint stiffness, decreased range of motion, redness, swelling, muscle weakness, and gout.  Skin       Complains of unusual moles/lumps or sores.      Denies bruising and hair/skin or nail changes.      Comments: right upper thigh Psych       Denies mood changes, temper/anger issues, anxiety/stress, speech problems, depression, and sleep problems. Other Comments: Patient c/o lump on right thigh that started early in the year. It is growing in size and increasing in pain. There is no discoloration. She has no seen PCP, wants to find a new doctor. Patient given a card for [facility].   Past History:  Past Medical History: Diabetes mellitus, type II Migraines Fibromyalgia Cyst drained from right breast  Past Surgical History:  Appendectomy  Family History: Family History Diabetes  1st degree relative Family History Other cancer Family History of Cardiovascular disorder Father- stomach CA First cousin with Leukemia  Social History: Widow Never Smoked Alcohol use-no Drug use-no Regular exercise-yes Physical Exam General appearance: well developed, well nourished, no acute distress MSE: oriented to time, place, and person R leg/femur: FROM of hip and knee.  Mildly visible enlargement medially about mid-thigh.  Soft rubbery-feeling mass approx 4cm in diamter, mildly tender to palpation.  No pulsations, no warmth, no signs of infection. Assessment New Problems: NEOPLASMS UNSPEC NATURE BONE SOFT TISSUE&SKIN (ICD-239.2)   Plan New Orders: Est. Patient Level IV [16109] T-DG Femur*R* [73550] Planning Comments:   Obtained an Xray to see if we can see anything like phleboliths or bony involvement.  I believe this is likely a lipoma.   The Xray is read by radiology as normal other than mild knee effusion.  Nevertheless, with her history, should likely be worked up further by a Development worker, international aid (excision or biopsy) and perhaps ultrasound to see it better.  Will set the patient up with [facility] this week.   Pt to establish with [place] to discuss her HTN and to schedule a f/u for a mammogram.  ER precautions for CP, SOB, visual changes, dizziness, etc.   The patient and/or caregiver has been counseled thoroughly with regard to medications prescribed including dosage, schedule, interactions, rationale for use, and possible side effects and they verbalize understanding.  Diagnoses and expected course of recovery discussed and will return if not improved as expected or if the condition worsens. Patient and/or caregiver verbalized understanding.   Orders Added: 1)  Est. Patient Level IV [60454] 2)  T-DG Femur*R* [73550]

## 2011-08-29 ENCOUNTER — Other Ambulatory Visit: Payer: Self-pay | Admitting: *Deleted

## 2011-08-29 MED ORDER — LISINOPRIL-HYDROCHLOROTHIAZIDE 10-12.5 MG PO TABS: 1.0000 | ORAL_TABLET | Freq: Every day | ORAL | Status: DC

## 2011-10-10 ENCOUNTER — Ambulatory Visit (INDEPENDENT_AMBULATORY_CARE_PROVIDER_SITE_OTHER): Payer: BC Managed Care – PPO | Admitting: Physician Assistant

## 2011-10-10 ENCOUNTER — Encounter: Payer: Self-pay | Admitting: Physician Assistant

## 2011-10-10 ENCOUNTER — Telehealth: Payer: Self-pay | Admitting: *Deleted

## 2011-10-10 VITALS — BP 165/92 | HR 68 | Temp 98.5°F | Ht 61.5 in | Wt 192.0 lb

## 2011-10-10 DIAGNOSIS — H1010 Acute atopic conjunctivitis, unspecified eye: Secondary | ICD-10-CM

## 2011-10-10 DIAGNOSIS — I10 Essential (primary) hypertension: Secondary | ICD-10-CM

## 2011-10-10 MED ORDER — OLOPATADINE HCL 0.2 % OP SOLN: 1.0000 [drp] | Freq: Once | OPHTHALMIC | Status: DC

## 2011-10-10 MED ORDER — FLUTICASONE PROPIONATE 50 MCG/ACT NA SUSP: 2.0000 | Freq: Every day | NASAL | Status: DC

## 2011-10-10 MED ORDER — MOMETASONE FUROATE 50 MCG/ACT NA SUSP: 2.0000 | Freq: Every day | NASAL | Status: DC

## 2011-10-10 NOTE — Telephone Encounter (Signed)
Called stating pt wants to know if we can call in something else to replace the Nasonex that is cheaper. Please advise.

## 2011-10-10 NOTE — Telephone Encounter (Signed)
Can try flonase. Will send to pharmacy.

## 2011-10-10 NOTE — Progress Notes (Signed)
  Subjective:    Patient ID: Samantha Price, female    DOB: 12-22-1960, 51 y.o.   MRN: [id number]  HPI Patient presents with 1 week of itchy watery eyes and sneezing. She takes claritin every day. Has used Otc decongestants. Congestion is worse in the am. It is hard for her to breathe when her nose is so congested. She tries to take showers and get pollen off along with visine to keep eyes lubricated.   Review of Systems     Objective:   Physical Exam  Constitutional: She is oriented to person, place, and time. She appears well-developed and well-nourished.  HENT:  Head: Normocephalic and atraumatic.  Right Ear: External ear normal.  Left Ear: External ear normal.       TM's clear with mild retraction, cone of light visible. Bilateral nares edematous and erythematous with rhinorrhea present. Oropharynx erythematous with clear drainage.  Eyes:       Bilateral conjunctiva red and injected bilaterally with watery discharge.   Neck: Normal range of motion. Neck supple.  Cardiovascular: Normal rate, regular rhythm and normal heart sounds.   Pulmonary/Chest: Effort normal and breath sounds normal. She has no wheezes.  Lymphadenopathy:    She has no cervical adenopathy.  Neurological: She is alert and oriented to person, place, and time.  Skin: Skin is warm and dry.  Psychiatric: She has a normal mood and affect. Her behavior is normal.          Assessment & Plan:  Allergic conjunctivitis and rhinitis- Start using Pataday drops every day 1 drop each eye. Continue with claritin daily and start using nasonex 2 sprays each nose daily. Make sure to keep hands clean and away from eyes.   HTN- rechecked 155/89. Better but still need to increase bp meds. Do not use decongestants because they increase bp. When come in for yearly CPE if elevated bp then we will need to increase meds.

## 2011-10-10 NOTE — Patient Instructions (Addendum)
Start using Pataday drops every day 1 drop each eye. Continue with claritin daily and start using nasonex 2 sprays each nose daily. Make sure to keep hands clean and away from eyes. Discussed need to increase bp meds if not decreased by next visit need to change to get bp at goal. Needs pap smear/CPE. Do not take any decongestants due to increase of bp.

## 2011-10-11 NOTE — Telephone Encounter (Signed)
Pt informed

## 2012-05-21 ENCOUNTER — Other Ambulatory Visit: Payer: Self-pay | Admitting: *Deleted

## 2012-05-21 MED ORDER — LISINOPRIL-HYDROCHLOROTHIAZIDE 10-12.5 MG PO TABS: 1.0000 | ORAL_TABLET | Freq: Every day | ORAL | Status: DC

## 2012-07-14 ENCOUNTER — Other Ambulatory Visit: Payer: Self-pay | Admitting: Family Medicine

## 2012-08-17 ENCOUNTER — Other Ambulatory Visit: Payer: Self-pay | Admitting: Family Medicine

## 2012-09-27 ENCOUNTER — Encounter: Payer: BC Managed Care – PPO | Admitting: Family Medicine

## 2012-11-17 ENCOUNTER — Other Ambulatory Visit: Payer: Self-pay | Admitting: Family Medicine

## 2012-11-20 NOTE — Telephone Encounter (Signed)
Must make appointment 

## 2013-01-09 ENCOUNTER — Other Ambulatory Visit: Payer: Self-pay | Admitting: Family Medicine

## 2013-02-02 ENCOUNTER — Other Ambulatory Visit: Payer: Self-pay | Admitting: Family Medicine

## 2013-02-09 ENCOUNTER — Other Ambulatory Visit: Payer: Self-pay | Admitting: Family Medicine

## 2013-02-13 ENCOUNTER — Other Ambulatory Visit: Payer: Self-pay | Admitting: Family Medicine

## 2013-02-18 ENCOUNTER — Ambulatory Visit: Payer: BC Managed Care – PPO | Admitting: Family Medicine

## 2013-02-20 ENCOUNTER — Ambulatory Visit (INDEPENDENT_AMBULATORY_CARE_PROVIDER_SITE_OTHER): Payer: BC Managed Care – PPO | Admitting: Family Medicine

## 2013-02-20 ENCOUNTER — Ambulatory Visit: Payer: BC Managed Care – PPO | Admitting: Family Medicine

## 2013-02-20 ENCOUNTER — Encounter: Payer: Self-pay | Admitting: Family Medicine

## 2013-02-20 VITALS — BP 177/85 | HR 69 | Ht 62.0 in | Wt 185.0 lb

## 2013-02-20 DIAGNOSIS — Z1211 Encounter for screening for malignant neoplasm of colon: Secondary | ICD-10-CM

## 2013-02-20 DIAGNOSIS — Z1231 Encounter for screening mammogram for malignant neoplasm of breast: Secondary | ICD-10-CM

## 2013-02-20 DIAGNOSIS — I10 Essential (primary) hypertension: Secondary | ICD-10-CM

## 2013-02-20 DIAGNOSIS — E119 Type 2 diabetes mellitus without complications: Secondary | ICD-10-CM

## 2013-02-20 MED ORDER — LISINOPRIL-HYDROCHLOROTHIAZIDE 20-25 MG PO TABS: 1.0000 | ORAL_TABLET | Freq: Every day | ORAL | Status: DC

## 2013-02-20 MED ORDER — AMBULATORY NON FORMULARY MEDICATION: Status: AC

## 2013-02-20 MED ORDER — SITAGLIP PHOS-METFORMIN HCL ER 50-1000 MG PO TB24: 1.0000 | ORAL_TABLET | Freq: Every day | ORAL | Status: DC

## 2013-02-20 NOTE — Patient Instructions (Addendum)
2 Gram Low Sodium Diet A 2 gram sodium diet restricts the amount of sodium in the diet to no more than 2 g or 2000 mg daily. Limiting the amount of sodium is often used to help lower blood pressure. It is important if you have heart, liver, or kidney problems. Many foods contain sodium for flavor and sometimes as a preservative. When the amount of sodium in a diet needs to be low, it is important to know what to look for when choosing foods and drinks. The following includes some information and guidelines to help make it easier for you to adapt to a low sodium diet. QUICK TIPS  Do not add salt to food.  Avoid convenience items and fast food.  Choose unsalted snack foods.  Buy lower sodium products, often labeled as "lower sodium" or "no salt added."  Check food labels to learn how much sodium is in 1 serving.  When eating at a restaurant, ask that your food be prepared with less salt or none, if possible. READING FOOD LABELS FOR SODIUM INFORMATION The nutrition facts label is a good place to find how much sodium is in foods. Look for products with no more than 500 to 600 mg of sodium per meal and no more than 150 mg per serving. Remember that 2 g = 2000 mg. The food label may also list foods as:  Sodium-free: Less than 5 mg in a serving.  Very low sodium: 35 mg or less in a serving.  Low-sodium: 140 mg or less in a serving.  Light in sodium: 50% less sodium in a serving. For example, if a food that usually has 300 mg of sodium is changed to become light in sodium, it will have 150 mg of sodium.  Reduced sodium: 25% less sodium in a serving. For example, if a food that usually has 400 mg of sodium is changed to reduced sodium, it will have 300 mg of sodium. CHOOSING FOODS Grains  Avoid: Salted crackers and snack items. Some cereals, including instant hot cereals. Bread stuffing and biscuit mixes. Seasoned rice or pasta mixes.  Choose: Unsalted snack items. Low-sodium cereals, oats,  puffed wheat and rice, shredded wheat. English muffins and bread. Pasta. Meats  Avoid: Salted, canned, smoked, spiced, pickled meats, including fish and poultry. Bacon, ham, sausage, cold cuts, hot dogs, anchovies.  Choose: Low-sodium canned tuna and salmon. Fresh or frozen meat, poultry, and fish. Dairy  Avoid: Processed cheese and spreads. Cottage cheese. Buttermilk and condensed milk. Regular cheese.  Choose: Milk. Low-sodium cottage cheese. Yogurt. Sour cream. Low-sodium cheese. Fruits and Vegetables  Avoid: Regular canned vegetables. Regular canned tomato sauce and paste. Frozen vegetables in sauces. Olives. Pickles. Relishes. Sauerkraut.  Choose: Low-sodium canned vegetables. Low-sodium tomato sauce and paste. Frozen or fresh vegetables. Fresh and frozen fruit. Condiments  Avoid: Canned and packaged gravies. Worcestershire sauce. Tartar sauce. Barbecue sauce. Soy sauce. Steak sauce. Ketchup. Onion, garlic, and table salt. Meat flavorings and tenderizers.  Choose: Fresh and dried herbs and spices. Low-sodium varieties of mustard and ketchup. Lemon juice. Tabasco sauce. Horseradish. SAMPLE 2 GRAM SODIUM MEAL PLAN Breakfast / Sodium (mg)  1 cup low-fat milk / 143 mg  2 slices whole-wheat toast / 270 mg  1 tbs heart-healthy margarine / 153 mg  1 hard-boiled egg / 139 mg  1 small orange / 0 mg Lunch / Sodium (mg)  1 cup raw carrots / 76 mg   cup hummus / 298 mg  1 cup low-fat milk /   143 mg   cup red grapes / 2 mg  1 whole-wheat pita bread / 356 mg Dinner / Sodium (mg)  1 cup whole-wheat pasta / 2 mg  1 cup low-sodium tomato sauce / 73 mg  3 oz lean ground beef / 57 mg  1 small side salad (1 cup raw spinach leaves,  cup cucumber,  cup yellow bell pepper) with 1 tsp olive oil and 1 tsp red wine vinegar / 25 mg Snack / Sodium (mg)  1 container low-fat vanilla yogurt / 107 mg  3 graham cracker squares / 127 mg Nutrient Analysis  Calories: 2033  Protein:  77 g  Carbohydrate: 282 g  Fat: 72 g  Sodium: 1971 mg Document Released: 06/13/2005 Document Revised: 09/05/2011 Document Reviewed: 09/14/2009 [employer] Patient Information 2014 [employer].  

## 2013-02-20 NOTE — Addendum Note (Signed)
Addended by: Deno Etienne on: 02/20/2013 04:42 PM   Modules accepted: Orders

## 2013-02-20 NOTE — Progress Notes (Signed)
Subjective:    Patient ID: Samantha Price, female    DOB: Oct 30, 1960, 52 y.o.   MRN: [id number]  HPI HTN- out of BP meds x 3 days.   Pt denies chest pain, SOB, dizziness, or heart palpitations.  Taking meds as directed w/o problems.  Denies medication side effects.    Abnormal glucose at work. No inc thirst or urination. She says the last time this was checked was quite some time ago. I did have diabetes and her list but did not have any up-to-date blood work on her. She's not currently taking any medication for abnormal glucose. She does not have a glucometer at home.  Note, she is leaving for [place] tomorrow and will be gone for a week.  Review of Systems     BP 177/85  Pulse 69  Ht 5\' 2"  (1.575 m)  Wt 185 lb (83.915 kg)  BMI 33.83 kg/m2    No Known Allergies  Past Medical History  Diagnosis Date  . Diabetes mellitus     type 2  . Migraines   . Fibromyalgia   . Cyst     drained from Rt breast  . Herniated disc   . Wears glasses   . Anemia     Past Surgical History  Procedure Laterality Date  . Appendectomy    . Breast cyst excison      Left,   . Tubal ligation    . Colonoscopy w/ polypectomy      11-21-2005, Dr. Randa Evens.      History   Social History  . Marital Status: Widowed    Spouse Name: Jeannett Senior     Number of Children: 3  . 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 Smoking status: Never Smoker   . Smokeless tobacco: Not on file  . Alcohol Use: Yes  . Drug Use: Yes  . Sexual Activity: Not on file     Comment: widow, regularly exercises   Other Topics Concern  . Not on file   Social History Narrative   Her youngest daughter lives at home. Has 3 daughters.  Her husband committed suicide in 21-Nov-2008 from PTSD, and mother died 3 months prior to that.  Mom died in a tragik accident.     Family History  Problem Relation Age of Onset  . Cancer Father    stomach  . Diabetes Mother   . Colon cancer Other     aunt, cousin  . Heart disease Other     family hisatory  . Leukemia      1st cousin  . Breast cancer      aunt  . Stroke      Grandparent    Outpatient Encounter Prescriptions as of 02/20/2013  Medication Sig Dispense Refill  . aspirin 81 MG tablet Take 81 mg by mouth daily.        . Ginkgo Biloba 40 MG TABS Take 40 mg by mouth daily.        . Multiple Vitamin (MULTIVITAMIN PO) Take by mouth daily.        . Olopatadine HCl 0.2 % SOLN Apply 1 drop to eye once.  1 Bottle  0  . topiramate (TOPAMAX) 25 MG capsule Take 1 capsule (25 mg total) by mouth 2 (two) times daily.  60 capsule  6  . [DISCONTINUED] lisinopril-hydrochlorothiazide (PRINZIDE,ZESTORETIC) 10-12.5 MG per tablet TAKE 1  TABLET BY MOUTH DAILY.  15 tablet  0  . fluticasone (FLONASE) 50 MCG/ACT nasal spray Place 2 sprays into the nose daily.  1 g  6  . lisinopril-hydrochlorothiazide (PRINZIDE,ZESTORETIC) 20-25 MG per tablet Take 1 tablet by mouth daily.  30 tablet  2   No facility-administered encounter medications on file as of 02/20/2013.       Objective:   Physical Exam  Constitutional: She is oriented to person, place, and time. She appears well-developed and well-nourished.  HENT:  Head: Normocephalic and atraumatic.  Cardiovascular: Normal rate, regular rhythm and normal heart sounds.   Pulmonary/Chest: Effort normal and breath sounds normal.  Neurological: She is alert and oriented to person, place, and time.  Skin: Skin is warm and dry.  Psychiatric: She has a normal mood and affect. Her behavior is normal.          Assessment & Plan:  HTN - uncontrolle.d Will inc med. F/U in 6 weeks. Due for CMP and lipid panel. Reviewed the importance of low-salt diet, regular exercise, weight control, avoiding decongestants and NSAIDs. All of these things can increase blood pressures not well controlled.  DM- new diagnosis. Discussed the importance of getting  diabetic nutrition counseling. She says she'll call when she gets back in town so that we can arrange this. We'll also give her prescription for glucometer with strips and lancets so she can start to check her sugar to 3 times per week before breakfast. I will also start her on Janumet. Warned about potential for loose stools or diarrhea because of the metformin component of the medication. Call if she has any palms otherwise I will see her back in 6 weeks to make sure that she's tolerating it well and to adjust her medication at time. Anchors her to bring in her glucometer with her so that we can look to the levels and make adjustments as needed. Lab Results  Component Value Date   HGBA1C 8.4 02/20/2013   Overdue for mammogram. Orders placed.  Flu shot declined today. She says she's getting ready to travel to take her daughter to [place] for collagen she wants to wait she gets back to have this done.  Overdue for screen colonoscopy. Her last one was in 2007 she was supposed to have one 5 years later. [facility] She's okay with going back there so we will place a referral today. They should contact her soon to schedule.

## 2013-03-12 ENCOUNTER — Ambulatory Visit (INDEPENDENT_AMBULATORY_CARE_PROVIDER_SITE_OTHER): Payer: 59

## 2013-03-12 DIAGNOSIS — Z1231 Encounter for screening mammogram for malignant neoplasm of breast: Secondary | ICD-10-CM

## 2013-05-15 ENCOUNTER — Other Ambulatory Visit: Payer: Self-pay | Admitting: Family Medicine

## 2013-05-28 ENCOUNTER — Telehealth: Payer: Self-pay | Admitting: Family Medicine

## 2013-05-28 NOTE — Telephone Encounter (Signed)
Given to dr.[email or web address] for change.Samantha Price

## 2013-05-28 NOTE — Telephone Encounter (Signed)
Pt called.  She was informed by pharmcay that her Insurance no longer covers Janumet and will need a replacement.  Please call patient once this is done.  Thanks.

## 2013-05-29 ENCOUNTER — Other Ambulatory Visit: Payer: Self-pay | Admitting: Family Medicine

## 2013-05-29 MED ORDER — SAXAGLIPTIN-METFORMIN ER 2.5-1000 MG PO TB24: 1.0000 | ORAL_TABLET | Freq: Every day | ORAL | Status: DC

## 2013-05-29 NOTE — Telephone Encounter (Signed)
Pt called and informed of change via VM.Samantha Price [place]

## 2013-05-29 NOTE — Telephone Encounter (Signed)
Sent over rx for [employer]

## 2013-07-15 ENCOUNTER — Telehealth: Payer: Self-pay

## 2013-07-15 NOTE — Telephone Encounter (Signed)
Changed pharmacy

## 2013-07-16 ENCOUNTER — Other Ambulatory Visit: Payer: Self-pay | Admitting: *Deleted

## 2013-07-17 NOTE — Telephone Encounter (Signed)
Pt

## 2013-07-22 ENCOUNTER — Other Ambulatory Visit: Payer: Self-pay | Admitting: *Deleted

## 2013-07-22 MED ORDER — SAXAGLIPTIN-METFORMIN ER 2.5-1000 MG PO TB24
1.0000 | ORAL_TABLET | Freq: Every day | ORAL | Status: DC
Start: 2013-07-22 — End: 2013-07-24

## 2013-07-24 ENCOUNTER — Telehealth: Payer: Self-pay | Admitting: Family Medicine

## 2013-07-24 ENCOUNTER — Other Ambulatory Visit: Payer: Self-pay | Admitting: Family Medicine

## 2013-07-24 MED ORDER — LINAGLIPTIN-METFORMIN HCL 2.5-1000 MG PO TABS: 1.0000 | ORAL_TABLET | Freq: Two times a day (BID) | ORAL | Status: DC

## 2013-07-24 NOTE — Telephone Encounter (Signed)
Pt informed pt of change .Samantha Price

## 2013-07-24 NOTE — Telephone Encounter (Signed)
Call patient: Her insurance will no longer cover [facility]. I will switch her to [facility], which is in the same class of drugs and shouldn't be fairly equivalent. This one is considered preferred with her insurance.

## 2013-07-25 ENCOUNTER — Other Ambulatory Visit: Payer: Self-pay | Admitting: Family Medicine

## 2013-07-26 ENCOUNTER — Encounter: Payer: Self-pay | Admitting: Family Medicine

## 2013-07-26 ENCOUNTER — Ambulatory Visit (INDEPENDENT_AMBULATORY_CARE_PROVIDER_SITE_OTHER): Payer: BC Managed Care – PPO | Admitting: Family Medicine

## 2013-07-26 VITALS — BP 148/86 | HR 69 | Temp 97.9°F | Ht 62.0 in | Wt 187.0 lb

## 2013-07-26 DIAGNOSIS — Z23 Encounter for immunization: Secondary | ICD-10-CM

## 2013-07-26 DIAGNOSIS — Z1211 Encounter for screening for malignant neoplasm of colon: Secondary | ICD-10-CM

## 2013-07-26 DIAGNOSIS — E119 Type 2 diabetes mellitus without complications: Secondary | ICD-10-CM

## 2013-07-26 DIAGNOSIS — I10 Essential (primary) hypertension: Secondary | ICD-10-CM

## 2013-07-26 LAB — POCT GLYCOSYLATED HEMOGLOBIN (HGB A1C): Hemoglobin A1C: 8

## 2013-07-26 NOTE — Progress Notes (Signed)
Subjective:    Patient ID: Samantha Price, female    DOB: March 31, 1961, 53 y.o.   MRN: [id number]  HPI Diabetes - no hypoglycemic events. No wounds or sores that are not healing well. No increased thirst or urination. Checking glucose at home. Taking medications as prescribed without any side effects.   Hypertension- Pt denies chest pain, SOB, dizziness, or heart palpitations.  Taking meds as directed w/o problems.  Denies medication side effects.  Says was stressed trying to get her today so thinks that is why BP up today.    Review of Systems  BP 148/86  Pulse 69  Temp(Src) 97.9 F (36.6 C)  Ht 5\' 2"  (1.575 m)  Wt 187 lb (84.823 kg)  BMI 34.19 kg/m2  SpO2 97%    No Known Allergies  Past Medical History  Diagnosis Date  . Diabetes mellitus     type 2  . Migraines   . Fibromyalgia   . Cyst     drained from Rt breast  . Herniated disc   . Wears glasses   . Anemia     Past Surgical History  Procedure Laterality Date  . Appendectomy    . Breast cyst excison      Left,   . Tubal ligation    . Colonoscopy w/ polypectomy      10/26/05, Dr. Oletta Lamas.      History   Social History  . Marital Status: Widowed    Spouse Name: Annie Main     Number of Children: 3  . Years of Education: BA   Occupational History  . mortgage Production designer, theatre/television/film   Social History Main Topics  . Smoking status: Never Smoker   . Smokeless tobacco: Not on file  . Alcohol Use: Yes  . Drug Use: Yes  . Sexual Activity: Not on file     Comment: widow, regularly exercises   Other Topics Concern  . Not on file   Social History Narrative   Her youngest daughter lives at home. Has 3 daughters.  Her husband committed suicide in Oct 26, 2008 from PTSD, and mother died 9 months prior to that.  Mom died in a tragik accident.     Family History  Problem Relation Age of Onset  . Cancer Father     stomach  . Diabetes Mother   . Colon cancer Other     aunt, cousin  . Heart disease  Other     family hisatory  . Leukemia      1st cousin  . Breast cancer      aunt  . Stroke      Grandparent    Outpatient Encounter Prescriptions as of 07/26/2013  Medication Sig  . AMBULATORY NON FORMULARY MEDICATION Medication Name: Glucometer with strips and lancets to test 2-3 time per week.  Marland Kitchen aspirin 81 MG tablet Take 81 mg by mouth daily.    . Ginkgo Biloba 40 MG TABS Take 40 mg by mouth daily.    . Linagliptin-Metformin HCl (JENTADUETO) 2.10-998 MG TABS Take 1 tablet by mouth 2 (two) times daily.  Marland Kitchen lisinopril-hydrochlorothiazide (PRINZIDE,ZESTORETIC) 20-25 MG per tablet TAKE 1 TABLET BY MOUTH DAILY.  . Multiple Vitamin (MULTIVITAMIN PO) Take by mouth daily.    . Olopatadine HCl 0.2 % SOLN Apply 1 drop to eye once.  . topiramate (TOPAMAX) 25 MG capsule Take 1 capsule (25 mg total) by mouth 2 (two) times daily.  . fluticasone (FLONASE) 50 MCG/ACT nasal  spray Place 2 sprays into the nose daily.          Objective:   Physical Exam  Constitutional: She is oriented to person, place, and time. She appears well-developed and well-nourished.  HENT:  Head: Normocephalic and atraumatic.  Cardiovascular: Normal rate, regular rhythm and normal heart sounds.   Pulmonary/Chest: Effort normal and breath sounds normal.  Neurological: She is alert and oriented to person, place, and time.  Skin: Skin is warm and dry.  Psychiatric: She has a normal mood and affect. Her behavior is normal.          Assessment & Plan:  DM -  Uncontrolled.  A1C is 8.0. F/U in 3 months.  We can just changed medication to [facility] for cost/insurance. Discussed importance of working on diet and exercise. She does not have a regular exercise he retained but can make a very big difference in her toes. Her A1c is improved today but still elevated. Followup in 3 months. Discussed with her that if her A1c is not under 7 at her followup we will need to add a second medication to control her blood sugars.  Recommend yearly eye exam. I recommend Dr. Hoyle Sauer here and [facility]. She's also due for foot exam and urine microalbumin. We'll update her Prevnar today.  HTN- Uncontrolled. She felt she was reallly stressed today.  Repeat at f/u. Will need to adjust medication if not at goal at f/u in 3 mo.   Reminded her to please schedule a physical with Pap smear as she is overdue.  Also due for repeat colonoscopy. She was supposed to go in 2012 we actually made a referral. She did not show up for her appointment so they canceled her procedure. I reminded her again that she is due and will place another referral. Asked to be on the lookout for phone call and stressed her the importance of keeping the appointment.

## 2013-07-26 NOTE — Patient Instructions (Signed)
Recommend Dr. Hoyle Sauer for an eye exam.  [facility]).  Need year eye exam.  Please schedule a physical with pap smear at anytime.

## 2013-07-27 ENCOUNTER — Encounter: Payer: Self-pay | Admitting: Family Medicine

## 2013-07-31 ENCOUNTER — Encounter: Payer: Self-pay | Admitting: Physician Assistant

## 2013-07-31 ENCOUNTER — Ambulatory Visit (INDEPENDENT_AMBULATORY_CARE_PROVIDER_SITE_OTHER): Payer: BC Managed Care – PPO | Admitting: Physician Assistant

## 2013-07-31 VITALS — BP 167/79 | HR 73 | Wt 187.0 lb

## 2013-07-31 DIAGNOSIS — I10 Essential (primary) hypertension: Secondary | ICD-10-CM

## 2013-07-31 DIAGNOSIS — L259 Unspecified contact dermatitis, unspecified cause: Secondary | ICD-10-CM

## 2013-07-31 DIAGNOSIS — L239 Allergic contact dermatitis, unspecified cause: Secondary | ICD-10-CM

## 2013-07-31 DIAGNOSIS — L509 Urticaria, unspecified: Secondary | ICD-10-CM

## 2013-07-31 MED ORDER — METHYLPREDNISOLONE ACETATE 40 MG/ML IJ SUSP
40.0000 mg | Freq: Once | INTRAMUSCULAR | Status: AC
  Administered 2013-07-31: 40 mg via INTRAMUSCULAR

## 2013-07-31 MED ORDER — HYDROXYZINE HCL 25 MG PO TABS: 25.0000 mg | ORAL_TABLET | Freq: Three times a day (TID) | ORAL | Status: DC | PRN

## 2013-07-31 MED ORDER — PREDNISONE 20 MG PO TABS: ORAL_TABLET | ORAL | Status: DC

## 2013-07-31 NOTE — Progress Notes (Signed)
   Subjective:    Patient ID: Samantha Price, female    DOB: 1961-04-21, 53 y.o.   MRN: [id number]  HPI    .RASH  Location: All over her body. Mostly arms, legs, buttocks.  Onset: This morning approximately 20 minutes after finishing shower. She noticed large welts forming.  Course: Rash this point tenths to be stable with no new formations. Self-treated with: She has taken Benadryl          Improvement with treatment: With minimal relief  History Pruritis: yes  Tenderness: no  New medications/antibiotics: no  Tick/insect/pet exposure: no  Recent travel: no  New detergent, new clothing, or other topical exposure: yes, she washed with green of body wash instead of her regular white Dove body wash.    Red Flags Feeling ill: no  Fever: no  Mouth lesions: no  Facial/tongue swelling/difficulty breathing:  no  Diabetic or immunocompromised: yes     Review of Systems     Objective:   Physical Exam  Constitutional: She is oriented to person, place, and time. She appears well-developed and well-nourished.  HENT:  Head: Normocephalic and atraumatic.  Right Ear: External ear normal.  Left Ear: External ear normal.  Cardiovascular: Normal rate, regular rhythm and normal heart sounds.   Pulmonary/Chest: Effort normal and breath sounds normal.  Neurological: She is alert and oriented to person, place, and time.  Skin:     Psychiatric: She has a normal mood and affect. Her behavior is normal.          Assessment & Plan:  Rash/hives- most probable cause is her change of body wash that she use for the first time this morning. Patient hurt she stopped using body wash. Gave shot of 40 mg of Depo-Medrol as well as a prednisone taper. History was given for it which to use as needed. Call if symptoms worsening or not improving. Patient warned of dangerous red flag signs of allergic reaction with dysphagia and swelling of the tongue or mouth. If this happens take 50 mg of  Benadryl and call health care services.  Elevated blood pressure reading/hypertension-patient did not take medication this morning. Urged patient to take medication regularly recheck blood pressure in 4 weeks.

## 2013-07-31 NOTE — Patient Instructions (Signed)

## 2013-09-08 ENCOUNTER — Other Ambulatory Visit: Payer: Self-pay | Admitting: Family Medicine

## 2013-09-09 ENCOUNTER — Other Ambulatory Visit: Payer: Self-pay | Admitting: *Deleted

## 2013-09-09 MED ORDER — LISINOPRIL-HYDROCHLOROTHIAZIDE 20-25 MG PO TABS: ORAL_TABLET | ORAL | Status: DC

## 2014-01-15 ENCOUNTER — Other Ambulatory Visit: Payer: Self-pay | Admitting: Family Medicine

## 2014-01-20 ENCOUNTER — Ambulatory Visit (INDEPENDENT_AMBULATORY_CARE_PROVIDER_SITE_OTHER): Payer: BC Managed Care – PPO | Admitting: Family Medicine

## 2014-01-20 ENCOUNTER — Encounter: Payer: Self-pay | Admitting: Family Medicine

## 2014-01-20 VITALS — BP 146/78 | HR 69 | Ht 62.0 in | Wt 170.4 lb

## 2014-01-20 DIAGNOSIS — E119 Type 2 diabetes mellitus without complications: Secondary | ICD-10-CM

## 2014-01-20 DIAGNOSIS — E785 Hyperlipidemia, unspecified: Secondary | ICD-10-CM

## 2014-01-20 DIAGNOSIS — I10 Essential (primary) hypertension: Secondary | ICD-10-CM

## 2014-01-20 LAB — POCT GLYCOSYLATED HEMOGLOBIN (HGB A1C): HEMOGLOBIN A1C: 6

## 2014-01-20 MED ORDER — LISINOPRIL-HYDROCHLOROTHIAZIDE 10-12.5 MG PO TABS: 1.0000 | ORAL_TABLET | Freq: Every day | ORAL | Status: DC

## 2014-01-20 MED ORDER — LISINOPRIL-HYDROCHLOROTHIAZIDE 20-12.5 MG PO TABS: 1.0000 | ORAL_TABLET | Freq: Two times a day (BID) | ORAL | Status: DC

## 2014-01-20 NOTE — Progress Notes (Signed)
Subjective:    Patient ID: Samantha Price, female    DOB: 09-05-1960, 53 y.o.   MRN: [id number]  HPI Diabetes - no hypoglycemic events. No wounds or sores that are not healing well. No increased thirst or urination. Checking glucose at home. Taking medications as prescribed without any side effects. She cut out soda and rice. She has been eating a lot of veggies.    Hypertension- Pt denies chest pain, SOB, dizziness, or heart palpitations.  Taking meds as directed w/o problems.  Denies medication side effects.    Hyperlipidemia - Not on a statin.   No results found for this basename: CHOL, HDL, LDLCALC, LDLDIRECT, TRIG, CHOLHDL      Review of Systems BP 146/78  Pulse 69  Ht 5\' 2"  (1.575 m)  Wt 170 lb 6.4 oz (77.293 kg)  BMI 31.16 kg/m2    No Known Allergies  Past Medical History  Diagnosis Date  . Diabetes mellitus     type 2  . Migraines   . Fibromyalgia   . Cyst     drained from Rt breast  . Herniated disc   . Wears glasses   . Anemia     Past Surgical History  Procedure Laterality Date  . Appendectomy    . Breast cyst excison      Left,   . Tubal ligation    . Colonoscopy w/ polypectomy      10/17/05, Dr. Oletta Lamas.      History   Social History  . Marital Status: Widowed    Spouse Name: Annie Main     Number of Children: 3  . Years of Education: BA   Occupational History  . mortgage Production designer, theatre/television/film   Social History Main Topics  . Smoking status: Never Smoker   . Smokeless tobacco: Not on file  . Alcohol Use: Yes  . Drug Use: Yes  . Sexual Activity: Not on file     Comment: widow, regularly exercises   Other Topics Concern  . Not on file   Social History Narrative   Her youngest daughter lives at home. Has 3 daughters.  Her husband committed suicide in 10/17/08 from PTSD, and mother died 96 months prior to that.  Mom died in a tragik accident.     Family History  Problem Relation Age of Onset  . Cancer Father     stomach   . Diabetes Mother   . Colon cancer Other     aunt, cousin  . Heart disease Other     family hisatory  . Leukemia      1st cousin  . Breast cancer      aunt  . Stroke      Grandparent    Outpatient Encounter Prescriptions as of 01/20/2014  Medication Sig  . AMBULATORY NON FORMULARY MEDICATION Medication Name: Glucometer with strips and lancets to test 2-3 time per week.  Marland Kitchen aspirin 81 MG tablet Take 81 mg by mouth daily.    . Ginkgo Biloba 40 MG TABS Take 40 mg by mouth daily.    . hydrOXYzine (ATARAX/VISTARIL) 25 MG tablet Take 1 tablet (25 mg total) by mouth 3 (three) times daily as needed for itching.  . Linagliptin-Metformin HCl (JENTADUETO) 2.10-998 MG TABS Take 1 tablet by mouth 2 (two) times daily.  . Multiple Vitamin (MULTIVITAMIN PO) Take by mouth daily.    . Olopatadine HCl 0.2 % SOLN Apply 1 drop to eye once.  Marland Kitchen  predniSONE (DELTASONE) 20 MG tablet Take 2 tablets for 3 days, take 1 tablet for 3 days, take 1/2 tab for 3 days.  Marland Kitchen topiramate (TOPAMAX) 25 MG capsule Take 1 capsule (25 mg total) by mouth 2 (two) times daily.  . [DISCONTINUED] lisinopril-hydrochlorothiazide (PRINZIDE,ZESTORETIC) 20-25 MG per tablet TAKE ONE TABLET BY MOUTH ONCE DAILY  . [DISCONTINUED] lisinopril-hydrochlorothiazide (PRINZIDE,ZESTORETIC) 20-25 MG per tablet TAKE ONE TABLET BY MOUTH ONCE DAILY  . fluticasone (FLONASE) 50 MCG/ACT nasal spray Place 2 sprays into the nose daily.  Marland Kitchen lisinopril-hydrochlorothiazide (ZESTORETIC) 20-12.5 MG per tablet Take 1 tablet by mouth 2 (two) times daily.  . [DISCONTINUED] lisinopril-hydrochlorothiazide (PRINZIDE,ZESTORETIC) 10-12.5 MG per tablet Take 1 tablet by mouth daily.          Objective:   Physical Exam  Constitutional: She is oriented to person, place, and time. She appears well-developed and well-nourished.  HENT:  Head: Normocephalic and atraumatic.  Cardiovascular: Normal rate, regular rhythm and normal heart sounds.   Pulmonary/Chest: Effort  normal and breath sounds normal.  Neurological: She is alert and oriented to person, place, and time.  Skin: Skin is warm and dry.  Psychiatric: She has a normal mood and affect. Her behavior is normal.          Assessment & Plan:  DM- she has lost 17 lbs.  Well controlled. A1C is 6.0.  She is in absolutely fantastic. We'll try to collect a urine microalbumin today there she's not sure if she's able to give a urine sample. Followup in 3 months. Also discussed the need to add a statin. She takes a baby aspirin daily and is on ACE inhibitor.  HTN - improved but not maximally controlled. Will increase lisinopril to 40 mg daily. Followup in 3 months. Continue work on diet and weight loss. She is on fantastic so far.  Hyperlipidemia - really needs to be on a statin to reduce her risk. Due to recheck lipids.

## 2014-01-20 NOTE — Patient Instructions (Signed)
I have adjusted your blood pressure medication.  You will take it twice a day

## 2014-01-22 ENCOUNTER — Telehealth: Payer: Self-pay

## 2014-01-22 NOTE — Telephone Encounter (Signed)
Patient wants to confirm her new dosing for her lisinopril. The prescription reads; Lisinopril/HCTZ 20-12.5mg  Take 1 tablet by mouth 2 (two) times daily. Please advise.

## 2014-01-23 NOTE — Telephone Encounter (Signed)
Yes, that is correct so taking total of 40mg  of lisinopril daily and 25mg  of hctz daily.

## 2014-01-24 NOTE — Telephone Encounter (Signed)
Patient advised.

## 2014-03-07 LAB — COMPLETE METABOLIC PANEL WITH GFR
ALT: 15 U/L (ref 0–35)
AST: 19 U/L (ref 0–37)
Albumin: 4.2 g/dL (ref 3.5–5.2)
Alkaline Phosphatase: 48 U/L (ref 39–117)
BUN: 13 mg/dL (ref 6–23)
CO2: 29 mEq/L (ref 19–32)
CREATININE: 0.68 mg/dL (ref 0.50–1.10)
Calcium: 9.5 mg/dL (ref 8.4–10.5)
Chloride: 103 mEq/L (ref 96–112)
GFR, Est Non African American: >89 mL/min
Glucose, Bld: 114 mg/dL — ABNORMAL HIGH (ref 70–99)
Potassium: 4.2 mEq/L (ref 3.5–5.3)
Sodium: 141 mEq/L (ref 135–145)
Total Bilirubin: 0.6 mg/dL (ref 0.2–1.2)
Total Protein: 7 g/dL (ref 6.0–8.3)

## 2014-03-07 LAB — LIPID PANEL
Cholesterol: 180 mg/dL (ref 0–200)
HDL: 67 mg/dL (ref >39)
LDL Cholesterol: 94 mg/dL (ref 0–99)
TRIGLYCERIDES: 97 mg/dL (ref <150)
Total CHOL/HDL Ratio: 2.7 Ratio
VLDL: 19 mg/dL (ref 0–40)

## 2014-03-07 NOTE — Progress Notes (Signed)
Quick Note:  All labs are normal. ______ 

## 2014-07-20 ENCOUNTER — Encounter: Payer: Self-pay | Admitting: Emergency Medicine

## 2014-07-20 ENCOUNTER — Emergency Department
Admission: EM | Admit: 2014-07-20 | Discharge: 2014-07-20 | Disposition: A | Discharge status: Home / Self Care | Payer: BLUE CROSS/BLUE SHIELD | Source: Home / Self Care | Attending: Emergency Medicine | Admitting: Emergency Medicine

## 2014-07-20 DIAGNOSIS — W57XXXA Bitten or stung by nonvenomous insect and other nonvenomous arthropods, initial encounter: Principal | ICD-10-CM

## 2014-07-20 DIAGNOSIS — S80861A Insect bite (nonvenomous), right lower leg, initial encounter: Secondary | ICD-10-CM

## 2014-07-20 DIAGNOSIS — S90862A Insect bite (nonvenomous), left foot, initial encounter: Secondary | ICD-10-CM | POA: Diagnosis not present

## 2014-07-20 MED ORDER — MUPIROCIN CALCIUM 2 % EX CREA: 1.0000 "application " | TOPICAL_CREAM | Freq: Two times a day (BID) | CUTANEOUS | Status: DC

## 2014-07-20 NOTE — Discharge Instructions (Signed)
Insect Bite Mosquitoes, flies, fleas, bedbugs, and many other insects can bite. Insect bites are different from insect stings. A sting is when venom is injected into the skin. Some insect bites can transmit infectious diseases. SYMPTOMS  Insect bites usually turn red, swell, and itch for 2 to 4 days. They often go away on their own. TREATMENT  Your caregiver may prescribe antibiotic medicines if a bacterial infection develops in the bite. HOME CARE INSTRUCTIONS  Do not scratch the bite area.  Keep the bite area clean and dry. Wash the bite area thoroughly with soap and water.  Put ice or cool compresses on the bite area.  Put ice in a plastic bag.  Place a towel between your skin and the bag.  Leave the ice on for 20 minutes, 4 times a day for the first 2 to 3 days, or as directed.  You may apply a baking soda paste, cortisone cream, or calamine lotion to the bite area as directed by your caregiver. This can help reduce itching and swelling.  Only take over-the-counter or prescription medicines as directed by your caregiver.  If you are given antibiotics, take them as directed. Finish them even if you start to feel better. You may need a tetanus shot if:  You cannot remember when you had your last tetanus shot.  You have never had a tetanus shot.  The injury broke your skin. If you get a tetanus shot, your arm may swell, get red, and feel warm to the touch. This is common and not a problem. If you need a tetanus shot and you choose not to have one, there is a rare chance of getting tetanus. Sickness from tetanus can be serious. SEEK IMMEDIATE MEDICAL CARE IF:   You have increased pain, redness, or swelling in the bite area.  You see a red line on the skin coming from the bite.  You have a fever.  You have joint pain.  You have a headache or neck pain.  You have unusual weakness.  You have a rash.  You have chest pain or shortness of breath.  You have abdominal pain,  nausea, or vomiting.  You feel unusually tired or sleepy. MAKE SURE YOU:   Understand these instructions.  Will watch your condition.  Will get help right away if you are not doing well or get worse. Document Released: 07/21/2004 Document Revised: 09/05/2011 Document Reviewed: 01/12/2011 [facility] Patient Information 2015 [place], [place]. This information is not intended to replace advice given to you by your health care provider. Make sure you discuss any questions you have with your health care provider.  Apply hydrocortisone cream topically twice a day

## 2014-07-20 NOTE — ED Provider Notes (Signed)
CSN: [id number]     Arrival date & time 07/20/14  1150 History   First MD Initiated Contact with Patient 07/20/14 1233     Chief Complaint  Patient presents with  . Insect Bite   (Consider location/radiation/quality/duration/timing/severity/associated sxs/prior Treatment) Patient is a 54 y.o. female presenting with animal bite. The history is provided by the patient. No language interpreter was used.  Animal Bite Contact animal:  Insect Time since incident:  3 weeks Pain details:    Quality:  Aching   Severity:  Mild   Progression:  Worsening Tetanus status:  Up to date Worsened by:  Nothing tried Ineffective treatments:  None tried Pt reports she was bitten by an insect 3 weeks ago.  Pt reports swelling will not go down and wound is not healing  Past Medical History  Diagnosis Date  . Diabetes mellitus     type 2  . Migraines   . Fibromyalgia   . Cyst     drained from Rt breast  . Herniated disc   . Wears glasses   . Anemia    Past Surgical History  Procedure Laterality Date  . Appendectomy    . Breast cyst excison      Left,   . Tubal ligation    . Colonoscopy w/ polypectomy      2007, Dr. Oletta Lamas.     Family History  Problem Relation Age of Onset  . Cancer Father     stomach  . Diabetes Mother   . Colon cancer Other     aunt, cousin  . Heart disease Other     family hisatory  . Leukemia      1st cousin  . Breast cancer      aunt  . Stroke      Grandparent   History  Substance Use Topics  . Smoking status: Never Smoker   . Smokeless tobacco: Not on file  . Alcohol Use: Yes   OB History    No data available     Review of Systems  Musculoskeletal: Positive for myalgias.  All other systems reviewed and are negative.   Allergies  Review of patient's allergies indicates no known allergies.  Home Medications   Prior to Admission medications   Medication Sig Start Date End Date Taking? Authorizing Provider  AMBULATORY NON FORMULARY MEDICATION  Medication Name: Glucometer with strips and lancets to test 2-3 time per week. 02/20/13   Hali Marry, MD  aspirin 81 MG tablet Take 81 mg by mouth daily.      Historical Provider, MD  fluticasone (FLONASE) 50 MCG/ACT nasal spray Place 2 sprays into the nose daily. 10/10/11 10/09/12  Jade L Breeback, PA-C  Ginkgo Biloba 40 MG TABS Take 40 mg by mouth daily.      Historical Provider, MD  hydrOXYzine (ATARAX/VISTARIL) 25 MG tablet Take 1 tablet (25 mg total) by mouth 3 (three) times daily as needed for itching. 07/31/13   Jade L Breeback, PA-C  Linagliptin-Metformin HCl (JENTADUETO) 2.10-998 MG TABS Take 1 tablet by mouth 2 (two) times daily. 07/24/13   Hali Marry, MD  lisinopril-hydrochlorothiazide (ZESTORETIC) 20-12.5 MG per tablet Take 1 tablet by mouth 2 (two) times daily. 01/20/14   Hali Marry, MD  Multiple Vitamin (MULTIVITAMIN PO) Take by mouth daily.      Historical Provider, MD  mupirocin cream (BACTROBAN) 2 % Apply 1 application topically 2 (two) times daily. 07/20/14   Fransico Meadow, PA-C  Olopatadine HCl 0.2 %  SOLN Apply 1 drop to eye once. 10/10/11   Jade L Breeback, PA-C  predniSONE (DELTASONE) 20 MG tablet Take 2 tablets for 3 days, take 1 tablet for 3 days, take 1/2 tab for 3 days. 07/31/13   Jade L Breeback, PA-C  topiramate (TOPAMAX) 25 MG capsule Take 1 capsule (25 mg total) by mouth 2 (two) times daily. 11/26/10   Hali Marry, MD   BP 138/82 mmHg  Pulse 69  Temp(Src) 98.8 F (37.1 C) (Oral)  Resp 16  SpO2 97% Physical Exam  Constitutional: She appears well-developed and well-nourished.  HENT:  Head: Normocephalic.  Musculoskeletal: She exhibits tenderness.  35mm area of swelling.  Erythema   Skin: Skin is warm.  Psychiatric: She has a normal mood and affect.  Nursing note and vitals reviewed.   ED Course  Procedures (including critical care time) Labs Review Labs Reviewed - No data to display  Imaging Review No results found.   MDM    1. Insect bite of right leg, initial encounter    Bactroban Topical hydrocortisone AVS Follow up with Dr. Madilyn Fireman for evaluation    Fransico Meadow, PA-C 07/20/14 [phone]

## 2014-07-20 NOTE — ED Notes (Signed)
Reports noticing what appeared to be bug bite on right lateral calf 3 weeks ago; has already tried peroxide and bacitracin. Still itching and excoriated.

## 2014-07-22 ENCOUNTER — Telehealth: Payer: Self-pay | Admitting: Emergency Medicine

## 2014-07-28 ENCOUNTER — Ambulatory Visit (INDEPENDENT_AMBULATORY_CARE_PROVIDER_SITE_OTHER): Payer: BLUE CROSS/BLUE SHIELD | Admitting: Family Medicine

## 2014-07-28 ENCOUNTER — Other Ambulatory Visit: Payer: Self-pay | Admitting: Family Medicine

## 2014-07-28 ENCOUNTER — Encounter: Payer: Self-pay | Admitting: Family Medicine

## 2014-07-28 VITALS — BP 135/87 | HR 100 | Temp 98.5°F | Wt 176.0 lb

## 2014-07-28 DIAGNOSIS — J00 Acute nasopharyngitis [common cold]: Secondary | ICD-10-CM

## 2014-07-28 DIAGNOSIS — J208 Acute bronchitis due to other specified organisms: Secondary | ICD-10-CM

## 2014-07-28 DIAGNOSIS — L98491 Non-pressure chronic ulcer of skin of other sites limited to breakdown of skin: Secondary | ICD-10-CM

## 2014-07-28 DIAGNOSIS — B9689 Other specified bacterial agents as the cause of diseases classified elsewhere: Secondary | ICD-10-CM

## 2014-07-28 MED ORDER — DOXYCYCLINE HYCLATE 100 MG PO TABS: ORAL_TABLET | ORAL | Status: AC

## 2014-07-28 MED ORDER — HYDROCODONE-HOMATROPINE 5-1.5 MG/5ML PO SYRP: 5.0000 mL | ORAL_SOLUTION | Freq: Three times a day (TID) | ORAL | Status: DC | PRN

## 2014-07-28 NOTE — Progress Notes (Signed)
CC: Samantha Price is a 54 y.o. female is here for Nasal Congestion and check bug bite   Subjective: HPI:  Thick nasal congestion and pressure behind the eyes that has been present for the past week. both of which are mild to moderate in severity. Accompanied by drainage sensation down the back of the throat which is causing a cough that is nonproductive. Symptoms have not benefited from vitamin C supplementation, no other interventions as of yet. accompanied by fatigue and subjective chills. No fevers, wheezing, shortness of breath, chest pain.  She also wants me to take a look at a lesion on the lateral right calf. It's been present for about 2 weeks. Minimal improvement from Bactroban,over-the-counter triple and biotic therapy. It is tender to the touch. It came on after she believes she was bit by an insect. She denies skin changes elsewhere  Review Of Systems Outlined In HPI  Past Medical History  Diagnosis Date  . Diabetes mellitus     type 2  . Migraines   . Fibromyalgia   . Cyst     drained from Rt breast  . Herniated disc   . Wears glasses   . Anemia     Past Surgical History  Procedure Laterality Date  . Appendectomy    . Breast cyst excison      Left,   . Tubal ligation    . Colonoscopy w/ polypectomy      2005-10-12, Dr. Oletta Lamas.     Family History  Problem Relation Age of Onset  . Cancer Father     stomach  . Diabetes Mother   . Colon cancer Other     aunt, cousin  . Heart disease Other     family hisatory  . Leukemia      1st cousin  . Breast cancer      aunt  . Stroke      Grandparent    History   Social History  . Marital Status: Widowed    Spouse Name: Samantha Price     Number of Children: 3  . Years of Education: BA   Occupational History  . mortgage Production designer, theatre/television/film   Social History Price Topics  . Smoking status: Never Smoker   . Smokeless tobacco: Not on file  . Alcohol Use: Yes  . Drug Use: Yes  . Sexual Activity: Not  on file     Comment: widow, regularly exercises   Other Topics Concern  . Not on file   Social History Narrative   Her youngest daughter lives at home. Has 3 daughters.  Her husband committed suicide in 2008-10-12 from PTSD, and mother died 107 months prior to that.  Mom died in a tragik accident.      Objective: BP 135/87 mmHg  Pulse 100  Temp(Src) 98.5 F (36.9 C) (Oral)  Wt 176 lb (79.833 kg)  General: Alert and Oriented, No Acute Distress HEENT: Pupils equal, round, reactive to light. Conjunctivae clear.  External ears unremarkable, canals clear with intact TMs with appropriate landmarks.  Middle ear appears open without effusion. Pink inferior turbinates with mild mucoid discharge.  Moist mucous membranes, pharynx without lesions however moderate cobblestoning and postnasal drip.  Neck supple without palpable lymphadenopathy nor abnormal masses. Lungs: Clear to auscultation bilaterally, no wheezing/ronchi/rales.  Comfortable work of breathing. Good air movement. Cardiac: Regular rate and rhythm. Normal S1/S2.  No murmurs, rubs, nor gallops.   Extremities: No peripheral edema.  Strong peripheral  pulses.  Mental Status: No depression, anxiety, nor agitation. Skin: Warm and dry. 3 mm scab without any surrounding induration or signs of infection located on the lateral right calf, surrounding this lesion in a Band-Aid distribution is a mild contact dermatitis  Assessment & Plan: [name] was seen today for nasal congestion and check bug bite.  Diagnoses and associated orders for this visit:  Skin ulcer, limited to breakdown of skin - doxycycline (VIBRA-TABS) 100 MG tablet; One by mouth twice a day for ten days.  Acute bacterial bronchitis - doxycycline (VIBRA-TABS) 100 MG tablet; One by mouth twice a day for ten days. - HYDROcodone-homatropine (HYCODAN) 5-1.5 MG/5ML syrup; Take 5 mLs by mouth every 8 (eight) hours as needed for cough.    Skin ulceration: No signs of infection,  encouraged her to keep the area covered and moisturize using Bactroban. Change dressing daily. Use tape and gauze since signs of Band-Aid dermatitis. Fortunately there is any staff involvement of this it should improve with doxycycline for the below condition. Bacterial bronchitis with component of bacterial sinusitis: Start doxycycline, symptoms are interfering with sleep to a degree where she would prefer to have prescription and cough medicine   Return if symptoms worsen or fail to improve.

## 2014-07-30 ENCOUNTER — Encounter: Payer: Self-pay | Admitting: Emergency Medicine

## 2014-08-29 ENCOUNTER — Ambulatory Visit (INDEPENDENT_AMBULATORY_CARE_PROVIDER_SITE_OTHER): Payer: BLUE CROSS/BLUE SHIELD | Admitting: Family Medicine

## 2014-08-29 ENCOUNTER — Encounter: Payer: Self-pay | Admitting: Family Medicine

## 2014-08-29 VITALS — BP 128/78 | HR 75 | Wt 180.0 lb

## 2014-08-29 DIAGNOSIS — E119 Type 2 diabetes mellitus without complications: Secondary | ICD-10-CM

## 2014-08-29 DIAGNOSIS — I10 Essential (primary) hypertension: Secondary | ICD-10-CM

## 2014-08-29 DIAGNOSIS — K625 Hemorrhage of anus and rectum: Secondary | ICD-10-CM

## 2014-08-29 DIAGNOSIS — R21 Rash and other nonspecific skin eruption: Secondary | ICD-10-CM | POA: Diagnosis not present

## 2014-08-29 DIAGNOSIS — S161XXA Strain of muscle, fascia and tendon at neck level, initial encounter: Secondary | ICD-10-CM

## 2014-08-29 DIAGNOSIS — G4489 Other headache syndrome: Secondary | ICD-10-CM

## 2014-08-29 MED ORDER — LISINOPRIL-HYDROCHLOROTHIAZIDE 20-12.5 MG PO TABS: 1.0000 | ORAL_TABLET | Freq: Two times a day (BID) | ORAL | Status: DC

## 2014-08-29 MED ORDER — TOPIRAMATE 50 MG PO TABS: 50.0000 mg | ORAL_TABLET | Freq: Two times a day (BID) | ORAL | Status: DC

## 2014-08-29 MED ORDER — LINAGLIPTIN-METFORMIN HCL 2.5-1000 MG PO TABS: 1.0000 | ORAL_TABLET | Freq: Two times a day (BID) | ORAL | Status: DC

## 2014-08-29 NOTE — Progress Notes (Signed)
Subjective:    Patient ID: Samantha Price, female    DOB: 13-Jun-1961, 54 y.o.   MRN: [id number]  HPI Diabetes - no hypoglycemic events. No wounds or sores that are not healing well. No increased thirst or urination. Checking glucose at home. Taking medications as prescribed without any side effects.  Hypertension- Pt denies chest pain, SOB, dizziness, or heart palpitations.  Taking meds as directed w/o problems.  Denies medication side effects.    Lesion on the right calf - Says had a bug bite intially and was put on oral antibiotic and mupiricin ointment. Was healing initially but then started to look red. Occ itches.  She has been mupricin for a month.    Had problems with headaches when lived in [place]. Even had an MRI.  Now they have recurred and are more on her left side.  Happening almost every day.  She is havgin a lot of shoulder and neck pain on the left.  She wasn't sure she's been formally diagnosed with migraine headaches but it is listed on her problem list this I suspect that she is.  She's also noticed a little bit of blood when she wipes. It typically occurs after bowel movement and she it's bright red. She denies any pain with the bowel movement. She had a colonoscopy done about a year ago that did show some polyps and she is due to repeat the test in 09/28/16.  Review of Systems  BP 128/78 mmHg  Pulse 75  Wt 180 lb (81.647 kg)  SpO2 95%    No Known Allergies  Past Medical History  Diagnosis Date  . Diabetes mellitus     type 2  . Migraines   . Fibromyalgia   . Cyst     drained from Rt breast  . Herniated disc   . Wears glasses   . Anemia     Past Surgical History  Procedure Laterality Date  . Appendectomy    . Breast cyst excison      Left,   . Tubal ligation    . Colonoscopy w/ polypectomy      09-28-05, Dr. Oletta Lamas.      History   Social History  . Marital Status: Widowed    Spouse Name: Annie Main   . Number of Children: 3  . Years of Education: BA    Occupational History  . mortgage Production designer, theatre/television/film   Social History Main Topics  . Smoking status: Never Smoker   . Smokeless tobacco: Not on file  . Alcohol Use: Yes  . Drug Use: Yes  . Sexual Activity: Not on file     Comment: widow, regularly exercises   Other Topics Concern  . Not on file   Social History Narrative   Her youngest daughter lives at home. Has 3 daughters.  Her husband committed suicide in Sep 28, 2008 from PTSD, and mother died 39 months prior to that.  Mom died in a tragik accident.     Family History  Problem Relation Age of Onset  . Cancer Father     stomach  . Diabetes Mother   . Colon cancer Other     aunt, cousin  . Heart disease Other     family hisatory  . Leukemia      1st cousin  . Breast cancer      aunt  . Stroke      Grandparent    Outpatient Encounter Prescriptions as of 08/29/2014  Medication Sig  . AMBULATORY NON FORMULARY MEDICATION Medication Name: Glucometer with strips and lancets to test 2-3 time per week.  Marland Kitchen aspirin 81 MG tablet Take 81 mg by mouth daily.    . Ginkgo Biloba 40 MG TABS Take 40 mg by mouth daily.    . hydrOXYzine (ATARAX/VISTARIL) 25 MG tablet Take 1 tablet (25 mg total) by mouth 3 (three) times daily as needed for itching.  . Linagliptin-Metformin HCl (JENTADUETO) 2.10-998 MG TABS Take 1 tablet by mouth 2 (two) times daily.  Marland Kitchen lisinopril-hydrochlorothiazide (ZESTORETIC) 20-12.5 MG per tablet Take 1 tablet by mouth 2 (two) times daily.  . Multiple Vitamin (MULTIVITAMIN PO) Take by mouth daily.    . mupirocin cream (BACTROBAN) 2 % Apply 1 application topically 2 (two) times daily.  . Olopatadine HCl 0.2 % SOLN Apply 1 drop to eye once.  . [DISCONTINUED] JENTADUETO 2.10-998 MG TABS TAKE ONE TABLET BY MOUTH TWICE DAILY  . [DISCONTINUED] lisinopril-hydrochlorothiazide (ZESTORETIC) 20-12.5 MG per tablet Take 1 tablet by mouth 2 (two) times daily.  . [DISCONTINUED] topiramate (TOPAMAX) 25 MG capsule Take 1  capsule (25 mg total) by mouth 2 (two) times daily.  . fluticasone (FLONASE) 50 MCG/ACT nasal spray Place 2 sprays into the nose daily.  Marland Kitchen topiramate (TOPAMAX) 50 MG tablet Take 1 tablet (50 mg total) by mouth 2 (two) times daily.  . [DISCONTINUED] HYDROcodone-homatropine (HYCODAN) 5-1.5 MG/5ML syrup Take 5 mLs by mouth every 8 (eight) hours as needed for cough.          Objective:   Physical Exam  Constitutional: She is oriented to person, place, and time. She appears well-developed and well-nourished.  HENT:  Head: Normocephalic and atraumatic.  Right Ear: External ear normal.  Left Ear: External ear normal.  Eyes: Conjunctivae are normal.  Neck: Neck supple. No thyromegaly present.  Cardiovascular: Normal rate, regular rhythm and normal heart sounds.   Pulmonary/Chest: Effort normal and breath sounds normal.  Musculoskeletal:  Neck with normal flexion and extension. Some pain and tenderness over the left trapezius. Decreased flexion to the left compared to the right. Normal rotation right and left. Shoulders with normal range of motion.  Lymphadenopathy:    She has no cervical adenopathy.  Neurological: She is alert and oriented to person, place, and time.  Skin: Skin is warm and dry.  Psychiatric: She has a normal mood and affect. Her behavior is normal.    She has  3 cm area of small 2-3 mm macules clustered in that area.  No papules or pustules.        Assessment & Plan:  HTN - Well controlled.  Continue current regimen.Marland Kitchen   DM-Will check A1C to the lab since we do not have a point-of-care testing available today. We'll call her with the results and make any adjustments needed. Hopefully it will look fantastic.  Contact dermatitis- Quit using the mupiricin ointment. I suspect at this point she has a contact dermatitis from the ointment. If is not looking significantly better in 5-7 days then please let me know.   Rectal bleeding - Work on softening the stools. Suspect  that the bleeding is either from an internal hemorrhoid or tear. Work on softening the stools with a stool softener. If not improving then please let me know or refer back to GI for further evaluation.   Left cervical strain- will try home exercises.  Can try muscle relaxer in the future if needed. I think this can make a big difference in  her frequency of headaches that she's been having.  HA- will increase the topamax to 50 mg twice a day since she is Re: On a low dose.

## 2014-08-30 LAB — BASIC METABOLIC PANEL WITH GFR
BUN: 9 mg/dL (ref 6–23)
CALCIUM: 9.9 mg/dL (ref 8.4–10.5)
CHLORIDE: 100 meq/L (ref 96–112)
CO2: 32 meq/L (ref 19–32)
CREATININE: 0.74 mg/dL (ref 0.50–1.10)
GFR, Est African American: >89 mL/min
Glucose, Bld: 173 mg/dL — ABNORMAL HIGH (ref 70–99)
POTASSIUM: 4 meq/L (ref 3.5–5.3)
Sodium: 141 mEq/L (ref 135–145)

## 2014-08-30 LAB — HEMOGLOBIN A1C
HEMOGLOBIN A1C: 6.7 % — AB (ref <5.7)
MEAN PLASMA GLUCOSE: 146 mg/dL — AB (ref <117)

## 2014-11-07 ENCOUNTER — Ambulatory Visit (INDEPENDENT_AMBULATORY_CARE_PROVIDER_SITE_OTHER): Payer: 59 | Admitting: Family Medicine

## 2014-11-07 ENCOUNTER — Encounter: Payer: Self-pay | Admitting: Family Medicine

## 2014-11-07 VITALS — BP 185/88 | HR 74 | Wt 180.0 lb

## 2014-11-07 DIAGNOSIS — S46811A Strain of other muscles, fascia and tendons at shoulder and upper arm level, right arm, initial encounter: Secondary | ICD-10-CM | POA: Diagnosis not present

## 2014-11-07 MED ORDER — MELOXICAM 7.5 MG PO TABS: 7.5000 mg | ORAL_TABLET | Freq: Every day | ORAL | Status: DC | PRN

## 2014-11-07 MED ORDER — CYCLOBENZAPRINE HCL 10 MG PO TABS: 10.0000 mg | ORAL_TABLET | Freq: Three times a day (TID) | ORAL | Status: DC | PRN

## 2014-11-07 NOTE — Progress Notes (Signed)
   Subjective:    Patient ID: Samantha Price, female    DOB: 04-14-1961, 54 y.o.   MRN: [id number]  HPI Right handed 54 yo female who c/o of right shouldr pain. INitially noticed pain initially when reaching to the side for her printer at work. Pain starts near base of the neck and down to the shoulder towards her elbow. Feels some pins and needle over her forearm.  Aleve doesn't seem to touch the pain.  No old injuries.  No real alleviating factors.     Review of Systems     Objective:   Physical Exam  Constitutional: She appears well-developed and well-nourished.  HENT:  Head: Normocephalic and atraumatic.  Musculoskeletal:  Neck with normal flexion and extension. The she did have pain with extension over the right trapezius area. Normal range of motion with rotation right and left. Normal side bending. Shoulders with normal range of motion but she did have pain in her upper back between the spine and the scapula with full extension of the arm. Strength at the shoulder, elbow and wrist is 5 out of 5 bilaterally. Negative empty can test. Good strength with external/internal rotation of the shoulder. Unable to reach onto her low back secondary to pain.  Skin: Skin is warm and dry.  Psychiatric: She has a normal mood and affect. Her behavior is normal.          Assessment & Plan:  Right trapezius strain-recommend start a different anti-inflammatory since the Aleve is not really helping. Use her heating pad for 15-20 meds to 3 times a day. Handout given on some gentle stretches to do on her own at home. We'll go ahead place referral to physical therapy. Also trial of muscle relaxer over the weekend. One about potential side effects including sedation.

## 2014-11-14 ENCOUNTER — Emergency Department
Admission: EM | Admit: 2014-11-14 | Discharge: 2014-11-14 | Disposition: A | Discharge status: Home / Self Care | Payer: 59 | Source: Home / Self Care | Attending: Emergency Medicine | Admitting: Emergency Medicine

## 2014-11-14 ENCOUNTER — Telehealth: Payer: Self-pay | Admitting: *Deleted

## 2014-11-14 ENCOUNTER — Encounter: Payer: Self-pay | Admitting: Emergency Medicine

## 2014-11-14 DIAGNOSIS — M62838 Other muscle spasm: Secondary | ICD-10-CM | POA: Diagnosis not present

## 2014-11-14 DIAGNOSIS — S80861D Insect bite (nonvenomous), right lower leg, subsequent encounter: Secondary | ICD-10-CM

## 2014-11-14 DIAGNOSIS — L03115 Cellulitis of right lower limb: Secondary | ICD-10-CM | POA: Diagnosis not present

## 2014-11-14 DIAGNOSIS — W57XXXD Bitten or stung by nonvenomous insect and other nonvenomous arthropods, subsequent encounter: Secondary | ICD-10-CM

## 2014-11-14 MED ORDER — BETAMETHASONE DIPROPIONATE 0.05 % EX CREA: TOPICAL_CREAM | Freq: Two times a day (BID) | CUTANEOUS | Status: DC

## 2014-11-14 MED ORDER — DOXYCYCLINE HYCLATE 100 MG PO CAPS: 100.0000 mg | ORAL_CAPSULE | Freq: Two times a day (BID) | ORAL | Status: DC

## 2014-11-14 NOTE — ED Notes (Signed)
Scapula pain x 1 week Dr Joellyn Quails is treating her for, still bothering her Rash on lower right leg, itching

## 2014-11-14 NOTE — Telephone Encounter (Signed)
Pt left vm stating that the rash she had before is back.  She said it's red and has some discharge coming from the center of it.  Would you advise that she go to urgent care and possibly have them do a culture? Please advise.

## 2014-11-14 NOTE — ED Provider Notes (Signed)
CSN: [id number]     Arrival date & time 11/14/14  1810 History   None   [facility].  Friday 6:28 PM Chief Complaint  Patient presents with  . Back Pain    Scapula pain  . Rash    Lower right leg x 3 weeks    HPI 2 chief complaints. First chief complaint: Persistent rash right lateral leg. Initially started a month or 2 ago with an insect bite and was treated with topical Bactroban and hydrocortisone cream which helped a little. She scratched it more and it became more red and swollen over the past 3 weeks. She states in the center was a tiny clear blister. That has dried up. Never had any pustule or pus drainage. She complains that this skin lesion itches severely. Minimally pain. Denies fever or chills or nausea or vomiting.  Second chief complaint Right parascapular muscle pain. Reviewed Dr. Gardiner Ramus recent notes.-Patient has tried Flexeril, but she feels that's made her drowsy. Also has tried meloxicam 7.5 mg daily and that helped somewhat. She admits that she lives sedentary lifestyle, works sitting at a desk most of the day. No radiation of the scapular pain. Denies numbness or tingling or focal weakness. Denies anterior chest pain, shortness of breath, or head or jaw pain Past Medical History  Diagnosis Date  . Diabetes mellitus     type 2  . Migraines   . Fibromyalgia   . Cyst     drained from Rt breast  . Herniated disc   . Wears glasses   . Anemia    Past Surgical History  Procedure Laterality Date  . Appendectomy    . Breast cyst excison      Left,   . Tubal ligation    . Colonoscopy w/ polypectomy      2007, Dr. Oletta Lamas.     Family History  Problem Relation Age of Onset  . Cancer Father     stomach  . Diabetes Mother   . Colon cancer Other     aunt, cousin  . Heart disease Other     family hisatory  . Leukemia      1st cousin  . Breast cancer      aunt  . Stroke      Grandparent   History  Substance Use Topics  . Smoking status:  Never Smoker   . Smokeless tobacco: Not on file  . Alcohol Use: Yes   OB History    No data available     Review of Systems Remainder of Review of Systems negative for acute change except as noted in the HPI.  Allergies  Review of patient's allergies indicates not on file. No known drug allergies  Home Medications   Prior to Admission medications   Medication Sig Start Date End Date Taking? Authorizing Provider  AMBULATORY NON FORMULARY MEDICATION Medication Name: Glucometer with strips and lancets to test 2-3 time per week. 02/20/13   Hali Marry, MD  aspirin 81 MG tablet Take 81 mg by mouth daily.      Historical Provider, MD  betamethasone dipropionate (DIPROLENE) 0.05 % cream Apply topically 2 (two) times daily. To affected area right leg 11/14/14   Jacqulyn Cane, MD  cyclobenzaprine (FLEXERIL) 10 MG tablet Take 1 tablet (10 mg total) by mouth 3 (three) times daily as needed for muscle spasms. 11/07/14   Hali Marry, MD  doxycycline (VIBRAMYCIN) 100 MG capsule Take 1 capsule (100 mg total) by mouth 2 (  two) times daily. For 10 days (antibiotic) 11/14/14   Jacqulyn Cane, MD  fluticasone [facility]) 50 MCG/ACT nasal spray Place 2 sprays into the nose daily. 10/10/11 10/09/12  Jade L Breeback, PA-C  Ginkgo Biloba 40 MG TABS Take 40 mg by mouth daily.      Historical Provider, MD  hydrOXYzine (ATARAX/VISTARIL) 25 MG tablet Take 1 tablet (25 mg total) by mouth 3 (three) times daily as needed for itching. 07/31/13   Jade L Breeback, PA-C  Linagliptin-Metformin HCl (JENTADUETO) 2.10-998 MG TABS Take 1 tablet by mouth 2 (two) times daily. 08/29/14   Hali Marry, MD  lisinopril-hydrochlorothiazide (ZESTORETIC) 20-12.5 MG per tablet Take 1 tablet by mouth 2 (two) times daily. 08/29/14   Hali Marry, MD  meloxicam (MOBIC) 7.5 MG tablet Take 1 tablet (7.5 mg total) by mouth daily as needed for pain. 11/07/14   Hali Marry, MD  Multiple Vitamin (MULTIVITAMIN PO) Take  by mouth daily.      Historical Provider, MD  mupirocin cream (BACTROBAN) 2 % Apply 1 application topically 2 (two) times daily. 07/20/14   Fransico Meadow, PA-C  Olopatadine HCl 0.2 % SOLN Apply 1 drop to eye once. 10/10/11   Jade L Breeback, PA-C  topiramate (TOPAMAX) 50 MG tablet Take 1 tablet (50 mg total) by mouth 2 (two) times daily. 08/29/14   Hali Marry, MD   BP 173/104 mmHg  Pulse 71  Temp(Src) 97.5 F (36.4 C) (Oral)  Ht 5\' 2"  (1.575 m)  Wt 181 lb (82.101 kg)  BMI 33.10 kg/m2  SpO2 98% Physical Exam  Constitutional: She is oriented to person, place, and time. She appears well-developed and well-nourished. No distress.  HENT:  Head: Normocephalic and atraumatic.  Eyes: Conjunctivae and EOM are normal. Pupils are equal, round, and reactive to light. No scleral icterus.  Neck: Normal range of motion.  Cardiovascular: Normal rate.   Pulmonary/Chest: Effort normal.  Abdominal: She exhibits no distension.  Musculoskeletal: Normal range of motion.       Arms: Tender, spasm, right parascapular muscle. No bony tenderness. Range of motion intact  Neurological: She is alert and oriented to person, place, and time.  Skin: Skin is warm.     3 x 3 cm of blanching erythema with a dried pustule in the center. Mild induration. No fluctuance. No red streaks.  Psychiatric: She has a normal mood and affect.  Nursing note and vitals reviewed.   ED Course  Procedures (including critical care time) Labs Review Labs Reviewed - No data to display  Imaging Review No results found.   MDM   1. Cellulitis of leg, right   2. Insect bite of right leg, subsequent encounter   3. Muscle spasm of right shoulder    The skin lesion right leg is likely inflammatory, but there could be an element of cellulitis.  Treatment options discussed, as well as risks, benefits, alternatives. Patient voiced understanding and agreement with the following plans: Discharge Medication List as of  11/14/2014  7:02 PM    START taking these medications   Details  betamethasone dipropionate (DIPROLENE) 0.05 % cream Apply topically 2 (two) times daily. To affected area right leg, Starting 11/14/2014, Until Discontinued, Print    doxycycline (VIBRAMYCIN) 100 MG capsule Take 1 capsule (100 mg total) by mouth 2 (two) times daily. For 10 days (antibiotic), Starting 11/14/2014, Until Discontinued, Print       advised to follow-up with a dermatologist if the skin lesion not improving  in 7 days. For the right parascapular muscle spasm and tenderness, we discussed options. Advised to take the Flexeril only at bedtime. Increase meloxicam to 7.5 mg twice a day. I explained to her that stretching and strengthening and range of motion exercises of right shoulder may be beneficial. Advised general stretching. Advised to start an exercise program under the guidance of her PCP. Follow-up with your primary care doctor in 5-7 days if not improving, or sooner if symptoms become worse. Also advised to have BP rechecked by PCP Precautions discussed. Red flags discussed. Questions invited and answered. Patient voiced understanding and agreement.     Jacqulyn Cane, MD 11/14/14 2056

## 2014-11-14 NOTE — Telephone Encounter (Signed)
Let's do UC since draining.

## 2014-11-14 NOTE — Telephone Encounter (Signed)
Pt advised to visit urgent care.

## 2014-12-12 ENCOUNTER — Ambulatory Visit: Payer: BLUE CROSS/BLUE SHIELD | Admitting: Family Medicine

## 2015-01-26 ENCOUNTER — Ambulatory Visit (INDEPENDENT_AMBULATORY_CARE_PROVIDER_SITE_OTHER): Payer: 59 | Admitting: Family Medicine

## 2015-01-26 ENCOUNTER — Encounter: Payer: Self-pay | Admitting: Family Medicine

## 2015-01-26 VITALS — BP 138/82 | HR 70 | Ht 62.0 in | Wt 180.0 lb

## 2015-01-26 DIAGNOSIS — M25511 Pain in right shoulder: Secondary | ICD-10-CM | POA: Diagnosis not present

## 2015-01-26 DIAGNOSIS — E119 Type 2 diabetes mellitus without complications: Secondary | ICD-10-CM | POA: Diagnosis not present

## 2015-01-26 DIAGNOSIS — I10 Essential (primary) hypertension: Secondary | ICD-10-CM

## 2015-01-26 DIAGNOSIS — G43909 Migraine, unspecified, not intractable, without status migrainosus: Secondary | ICD-10-CM | POA: Diagnosis not present

## 2015-01-26 LAB — POCT UA - MICROALBUMIN
CREATININE, POC: 50 mg/dL
MICROALBUMIN (UR) POC: 10 mg/L

## 2015-01-26 LAB — POCT GLYCOSYLATED HEMOGLOBIN (HGB A1C): Hemoglobin A1C: 6.8

## 2015-01-26 NOTE — Progress Notes (Signed)
   Subjective:    Patient ID: Samantha Price, female    DOB: [birth year], 54 y.o.   MRN: [id number]  HPI Hypertension- Pt denies chest pain, SOB, dizziness, or heart palpitations.  Taking meds as directed w/o problems.  Denies medication side effects.    Diabetes - no hypoglycemic events. No wounds or sores that are not healing well. No increased thirst or urination. Checking glucose at home. Taking medications as prescribed without any side effects.  Right arm pain - painful to reach for something high. Hard to carry something  In that hand. Pain is motly posterior shoulder. Says can't sleep on that shoulder bc painful. No trauma or injury.  Occ taking Aleve. Helps some.    Migraine HA - we increased her medication to 50mg  on her topamax. She says she really only takes it PRN, about 4 times per week.    Review of Systems     Objective:   Physical Exam  Constitutional: She is oriented to person, place, and time. She appears well-developed and well-nourished.  HENT:  Head: Normocephalic and atraumatic.  Cardiovascular: Normal rate, regular rhythm and normal heart sounds.   Pulmonary/Chest: Effort normal and breath sounds normal.  Musculoskeletal:  Right shoulder with normal extension.  Decrease internal rotation.  Unable to reach her mid back.  Tender over teh [facility] joint and over the posterior shoulder over the trapezius.  + weakness with empty can test on right.    Neurological: She is alert and oriented to person, place, and time.  Skin: Skin is warm and dry.  Psychiatric: She has a normal mood and affect. Her behavior is normal.          Assessment & Plan:  DM- well controlled. A1C is 6.8 today. Continue current regimen. Follow-up in 3 months.    HTN - well controlled. Repeat blood pressure looks fantastic. Continue current regimen.  Migraine - We discussed the Topamax is really a prophylactic medication area encouraged her to take it daily. Explained her the idea is that  she might only get a couple of headaches per week instead of 4 per week which is what she is currently experiencing.  Right shoulder pain/RC tear vs bursitis-based on exam today right sure pain-most consistent with rotator cuff injury. Particularly the supraspinatous muscle. Given handout on exercises and stretches to do on her own. She is Re: Taking an anti-inflammatory. Recommend avoiding heavy lifting and overuse of the shoulder. If not improving over the next 3-4 weeks and consider further evaluation with imaging. With no trauma or fall did not order an x-ray today.

## 2015-03-31 ENCOUNTER — Telehealth: Payer: Self-pay | Admitting: *Deleted

## 2015-03-31 DIAGNOSIS — M25511 Pain in right shoulder: Secondary | ICD-10-CM

## 2015-03-31 NOTE — Telephone Encounter (Signed)
Pt called and reports that her shoulder pain has not gotten any better and would like to proceed with having imaging done. Order placed.Audelia Hives [place]

## 2015-04-02 ENCOUNTER — Encounter: Payer: Self-pay | Admitting: Family Medicine

## 2015-04-02 ENCOUNTER — Ambulatory Visit (INDEPENDENT_AMBULATORY_CARE_PROVIDER_SITE_OTHER): Payer: 59

## 2015-04-02 ENCOUNTER — Telehealth: Payer: Self-pay | Admitting: *Deleted

## 2015-04-02 ENCOUNTER — Ambulatory Visit (INDEPENDENT_AMBULATORY_CARE_PROVIDER_SITE_OTHER): Payer: 59 | Admitting: Family Medicine

## 2015-04-02 VITALS — BP 166/81 | HR 64 | Wt 175.0 lb

## 2015-04-02 DIAGNOSIS — M25511 Pain in right shoulder: Secondary | ICD-10-CM | POA: Diagnosis not present

## 2015-04-02 DIAGNOSIS — M6248 Contracture of muscle, other site: Secondary | ICD-10-CM | POA: Diagnosis not present

## 2015-04-02 DIAGNOSIS — M7551 Bursitis of right shoulder: Secondary | ICD-10-CM | POA: Diagnosis not present

## 2015-04-02 DIAGNOSIS — M62838 Other muscle spasm: Secondary | ICD-10-CM

## 2015-04-02 DIAGNOSIS — M7550 Bursitis of unspecified shoulder: Secondary | ICD-10-CM | POA: Insufficient documentation

## 2015-04-02 MED ORDER — TRAMADOL HCL 50 MG PO TABS: 50.0000 mg | ORAL_TABLET | Freq: Three times a day (TID) | ORAL | Status: DC | PRN

## 2015-04-02 NOTE — Patient Instructions (Signed)
Thank you for coming in today. Call or go to the ER if you develop a large red swollen joint with extreme pain or oozing puss.  Return in 2-3 weeks.  Attend physical therapy.   Impingement Syndrome, Rotator Cuff, Bursitis With Rehab Impingement syndrome is a condition that involves inflammation of the tendons of the rotator cuff and the subacromial bursa, that causes pain in the shoulder. The rotator cuff consists of four tendons and muscles that control much of the shoulder and upper arm function. The subacromial bursa is a fluid filled sac that helps reduce friction between the rotator cuff and one of the bones of the shoulder (acromion). Impingement syndrome is usually an overuse injury that causes swelling of the bursa (bursitis), swelling of the tendon (tendonitis), and/or a tear of the tendon (strain). Strains are classified into three categories. Grade 1 strains cause pain, but the tendon is not lengthened. Grade 2 strains include a lengthened ligament, due to the ligament being stretched or partially ruptured. With grade 2 strains there is still function, although the function may be decreased. Grade 3 strains include a complete tear of the tendon or muscle, and function is usually impaired. SYMPTOMS   Pain around the shoulder, often at the outer portion of the upper arm.  Pain that gets worse with shoulder function, especially when reaching overhead or lifting.  Sometimes, aching when not using the arm.  Pain that wakes you up at night.  Sometimes, tenderness, swelling, warmth, or redness over the affected area.  Loss of strength.  Limited motion of the shoulder, especially reaching behind the back (to the back pocket or to unhook bra) or across your body.  Crackling sound (crepitation) when moving the arm.  Biceps tendon pain and inflammation (in the front of the shoulder). Worse when bending the elbow or lifting. CAUSES  Impingement syndrome is often an overuse injury, in which  chronic (repetitive) motions cause the tendons or bursa to become inflamed. A strain occurs when a force is paced on the tendon or muscle that is greater than it can withstand. Common mechanisms of injury include: Stress from sudden increase in duration, frequency, or intensity of training.  Direct hit (trauma) to the shoulder.  Aging, erosion of the tendon with normal use.  Bony bump on shoulder (acromial spur). RISK INCREASES WITH:  Contact sports (football, wrestling, boxing).  Throwing sports (baseball, tennis, volleyball).  Weightlifting and bodybuilding.  Heavy labor.  Previous injury to the rotator cuff, including impingement.  Poor shoulder strength and flexibility.  Failure to warm up properly before activity.  Inadequate protective equipment.  Old age.  Bony bump on shoulder (acromial spur). PREVENTION   Warm up and stretch properly before activity.  Allow for adequate recovery between workouts.  Maintain physical fitness:  Strength, flexibility, and endurance.  Cardiovascular fitness.  Learn and use proper exercise technique. PROGNOSIS  If treated properly, impingement syndrome usually goes away within 6 weeks. Sometimes surgery is required.  RELATED COMPLICATIONS   Longer healing time if not properly treated, or if not given enough time to heal.  Recurring symptoms, that result in a chronic condition.  Shoulder stiffness, frozen shoulder, or loss of motion.  Rotator cuff tendon tear.  Recurring symptoms, especially if activity is resumed too soon, with overuse, with a direct blow, or when using poor technique. TREATMENT  Treatment first involves the use of ice and medicine, to reduce pain and inflammation. The use of strengthening and stretching exercises may help reduce pain with  activity. These exercises may be performed at home or with a therapist. If non-surgical treatment is unsuccessful after more than 6 months, surgery may be advised. After  surgery and rehabilitation, activity is usually possible in 3 months.  MEDICATION  If pain medicine is needed, nonsteroidal anti-inflammatory medicines (aspirin and ibuprofen), or other minor pain relievers (acetaminophen), are often advised.  Do not take pain medicine for 7 days before surgery.  Prescription pain relievers may be given, if your caregiver thinks they are needed. Use only as directed and only as much as you need.  Corticosteroid injections may be given by your caregiver. These injections should be reserved for the most serious cases, because they may only be given a certain number of times. HEAT AND COLD  Cold treatment (icing) should be applied for 10 to 15 minutes every 2 to 3 hours for inflammation and pain, and immediately after activity that aggravates your symptoms. Use ice packs or an ice massage.  Heat treatment may be used before performing stretching and strengthening activities prescribed by your caregiver, physical therapist, or athletic trainer. Use a heat pack or a warm water soak. SEEK MEDICAL CARE IF:   Symptoms get worse or do not improve in 4 to 6 weeks, despite treatment.  New, unexplained symptoms develop. (Drugs used in treatment may produce side effects.) EXERCISES  RANGE OF MOTION (ROM) AND STRETCHING EXERCISES - Impingement Syndrome (Rotator Cuff  Tendinitis, Bursitis) These exercises may help you when beginning to rehabilitate your injury. Your symptoms may go away with or without further involvement from your physician, physical therapist or athletic trainer. While completing these exercises, remember:   Restoring tissue flexibility helps normal motion to return to the joints. This allows healthier, less painful movement and activity.  An effective stretch should be held for at least 30 seconds.  A stretch should never be painful. You should only feel a gentle lengthening or release in the stretched tissue. STRETCH - Flexion, Standing  Stand  with good posture. With an underhand grip on your right / left hand, and an overhand grip on the opposite hand, grasp a broomstick or cane so that your hands are a little more than shoulder width apart.  Keeping your right / left elbow straight and shoulder muscles relaxed, push the stick with your opposite hand, to raise your right / left arm in front of your body and then overhead. Raise your arm until you feel a stretch in your right / left shoulder, but before you have increased shoulder pain.  Try to avoid shrugging your right / left shoulder as your arm rises, by keeping your shoulder blade tucked down and toward your mid-back spine. Hold for __________ seconds.  Slowly return to the starting position. Repeat __________ times. Complete this exercise __________ times per day. STRETCH - Abduction, Supine  Lie on your back. With an underhand grip on your right / left hand and an overhand grip on the opposite hand, grasp a broomstick or cane so that your hands are a little more than shoulder width apart.  Keeping your right / left elbow straight and your shoulder muscles relaxed, push the stick with your opposite hand, to raise your right / left arm out to the side of your body and then overhead. Raise your arm until you feel a stretch in your right / left shoulder, but before you have increased shoulder pain.  Try to avoid shrugging your right / left shoulder as your arm rises, by keeping your shoulder  blade tucked down and toward your mid-back spine. Hold for __________ seconds.  Slowly return to the starting position. Repeat __________ times. Complete this exercise __________ times per day. ROM - Flexion, Active-Assisted  Lie on your back. You may bend your knees for comfort.  Grasp a broomstick or cane so your hands are about shoulder width apart. Your right / left hand should grip the end of the stick, so that your hand is positioned "thumbs-up," as if you were about to shake  hands.  Using your healthy arm to lead, raise your right / left arm overhead, until you feel a gentle stretch in your shoulder. Hold for __________ seconds.  Use the stick to assist in returning your right / left arm to its starting position. Repeat __________ times. Complete this exercise __________ times per day.  ROM - Internal Rotation, Supine   Lie on your back on a firm surface. Place your right / left elbow about 60 degrees away from your side. Elevate your elbow with a folded towel, so that the elbow and shoulder are the same height.  Using a broomstick or cane and your strong arm, pull your right / left hand toward your body until you feel a gentle stretch, but no increase in your shoulder pain. Keep your shoulder and elbow in place throughout the exercise.  Hold for __________ seconds. Slowly return to the starting position. Repeat __________ times. Complete this exercise __________ times per day. STRETCH - Internal Rotation  Place your right / left hand behind your back, palm up.  Throw a towel or belt over your opposite shoulder. Grasp the towel with your right / left hand.  While keeping an upright posture, gently pull up on the towel, until you feel a stretch in the front of your right / left shoulder.  Avoid shrugging your right / left shoulder as your arm rises, by keeping your shoulder blade tucked down and toward your mid-back spine.  Hold for __________ seconds. Release the stretch, by lowering your healthy hand. Repeat __________ times. Complete this exercise __________ times per day. ROM - Internal Rotation   Using an underhand grip, grasp a stick behind your back with both hands.  While standing upright with good posture, slide the stick up your back until you feel a mild stretch in the front of your shoulder.  Hold for __________ seconds. Slowly return to your starting position. Repeat __________ times. Complete this exercise __________ times per day.  STRETCH  - Posterior Shoulder Capsule   Stand or sit with good posture. Grasp your right / left elbow and draw it across your chest, keeping it at the same height as your shoulder.  Pull your elbow, so your upper arm comes in closer to your chest. Pull until you feel a gentle stretch in the back of your shoulder.  Hold for __________ seconds. Repeat __________ times. Complete this exercise __________ times per day. STRENGTHENING EXERCISES - Impingement Syndrome (Rotator Cuff Tendinitis, Bursitis) These exercises may help you when beginning to rehabilitate your injury. They may resolve your symptoms with or without further involvement from your physician, physical therapist or athletic trainer. While completing these exercises, remember:  Muscles can gain both the endurance and the strength needed for everyday activities through controlled exercises.  Complete these exercises as instructed by your physician, physical therapist or athletic trainer. Increase the resistance and repetitions only as guided.  You may experience muscle soreness or fatigue, but the pain or discomfort you are trying to  eliminate should never worsen during these exercises. If this pain does get worse, stop and make sure you are following the directions exactly. If the pain is still present after adjustments, discontinue the exercise until you can discuss the trouble with your clinician.  During your recovery, avoid activity or exercises which involve actions that place your injured hand or elbow above your head or behind your back or head. These positions stress the tissues which you are trying to heal. STRENGTH - Scapular Depression and Adduction   With good posture, sit on a firm chair. Support your arms in front of you, with pillows, arm rests, or on a table top. Have your elbows in line with the sides of your body.  Gently draw your shoulder blades down and toward your mid-back spine. Gradually increase the tension, without  tensing the muscles along the top of your shoulders and the back of your neck.  Hold for __________ seconds. Slowly release the tension and relax your muscles completely before starting the next repetition.  After you have practiced this exercise, remove the arm support and complete the exercise in standing as well as sitting position. Repeat __________ times. Complete this exercise __________ times per day.  STRENGTH - Shoulder Abductors, Isometric  With good posture, stand or sit about 4-6 inches from a wall, with your right / left side facing the wall.  Bend your right / left elbow. Gently press your right / left elbow into the wall. Increase the pressure gradually, until you are pressing as hard as you can, without shrugging your shoulder or increasing any shoulder discomfort.  Hold for __________ seconds.  Release the tension slowly. Relax your shoulder muscles completely before you begin the next repetition. Repeat __________ times. Complete this exercise __________ times per day.  STRENGTH - External Rotators, Isometric  Keep your right / left elbow at your side and bend it 90 degrees.  Step into a door frame so that the outside of your right / left wrist can press against the door frame without your upper arm leaving your side.  Gently press your right / left wrist into the door frame, as if you were trying to swing the back of your hand away from your stomach. Gradually increase the tension, until you are pressing as hard as you can, without shrugging your shoulder or increasing any shoulder discomfort.  Hold for __________ seconds.  Release the tension slowly. Relax your shoulder muscles completely before you begin the next repetition. Repeat __________ times. Complete this exercise __________ times per day.  STRENGTH - Supraspinatus   Stand or sit with good posture. Grasp a __________ weight, or an exercise band or tubing, so that your hand is "thumbs-up," like you are  shaking hands.  Slowly lift your right / left arm in a "V" away from your thigh, diagonally into the space between your side and straight ahead. Lift your hand to shoulder height or as far as you can, without increasing any shoulder pain. At first, many people do not lift their hands above shoulder height.  Avoid shrugging your right / left shoulder as your arm rises, by keeping your shoulder blade tucked down and toward your mid-back spine.  Hold for __________ seconds. Control the descent of your hand, as you slowly return to your starting position. Repeat __________ times. Complete this exercise __________ times per day.  STRENGTH - External Rotators  Secure a rubber exercise band or tubing to a fixed object (table, pole) so that it  is at the same height as your right / left elbow when you are standing or sitting on a firm surface.  Stand or sit so that the secured exercise band is at your uninjured side.  Bend your right / left elbow 90 degrees. Place a folded towel or small pillow under your right / left arm, so that your elbow is a few inches away from your side.  Keeping the tension on the exercise band, pull it away from your body, as if pivoting on your elbow. Be sure to keep your body steady, so that the movement is coming only from your rotating shoulder.  Hold for __________ seconds. Release the tension in a controlled manner, as you return to the starting position. Repeat __________ times. Complete this exercise __________ times per day.  STRENGTH - Internal Rotators   Secure a rubber exercise band or tubing to a fixed object (table, pole) so that it is at the same height as your right / left elbow when you are standing or sitting on a firm surface.  Stand or sit so that the secured exercise band is at your right / left side.  Bend your elbow 90 degrees. Place a folded towel or small pillow under your right / left arm so that your elbow is a few inches away from your  side.  Keeping the tension on the exercise band, pull it across your body, toward your stomach. Be sure to keep your body steady, so that the movement is coming only from your rotating shoulder.  Hold for __________ seconds. Release the tension in a controlled manner, as you return to the starting position. Repeat __________ times. Complete this exercise __________ times per day.  STRENGTH - Scapular Protractors, Standing   Stand arms length away from a wall. Place your hands on the wall, keeping your elbows straight.  Begin by dropping your shoulder blades down and toward your mid-back spine.  To strengthen your protractors, keep your shoulder blades down, but slide them forward on your rib cage. It will feel as if you are lifting the back of your rib cage away from the wall. This is a subtle motion and can be challenging to complete. Ask your caregiver for further instruction, if you are not sure you are doing the exercise correctly.  Hold for __________ seconds. Slowly return to the starting position, resting the muscles completely before starting the next repetition. Repeat __________ times. Complete this exercise __________ times per day. STRENGTH - Scapular Protractors, Supine  Lie on your back on a firm surface. Extend your right / left arm straight into the air while holding a __________ weight in your hand.  Keeping your head and back in place, lift your shoulder off the floor.  Hold for __________ seconds. Slowly return to the starting position, and allow your muscles to relax completely before starting the next repetition. Repeat __________ times. Complete this exercise __________ times per day. STRENGTH - Scapular Protractors, Quadruped  Get onto your hands and knees, with your shoulders directly over your hands (or as close as you can be, comfortably).  Keeping your elbows locked, lift the back of your rib cage up into your shoulder blades, so your mid-back rounds out. Keep  your neck muscles relaxed.  Hold this position for __________ seconds. Slowly return to the starting position and allow your muscles to relax completely before starting the next repetition. Repeat __________ times. Complete this exercise __________ times per day.  STRENGTH - Scapular Retractors  Secure a rubber exercise band or tubing to a fixed object (table, pole), so that it is at the height of your shoulders when you are either standing, or sitting on a firm armless chair.  With a palm down grip, grasp an end of the band in each hand. Straighten your elbows and lift your hands straight in front of you, at shoulder height. Step back, away from the secured end of the band, until it becomes tense.  Squeezing your shoulder blades together, draw your elbows back toward your sides, as you bend them. Keep your upper arms lifted away from your body throughout the exercise.  Hold for __________ seconds. Slowly ease the tension on the band, as you reverse the directions and return to the starting position. Repeat __________ times. Complete this exercise __________ times per day. STRENGTH - Shoulder Extensors   Secure a rubber exercise band or tubing to a fixed object (table, pole) so that it is at the height of your shoulders when you are either standing, or sitting on a firm armless chair.  With a thumbs-up grip, grasp an end of the band in each hand. Straighten your elbows and lift your hands straight in front of you, at shoulder height. Step back, away from the secured end of the band, until it becomes tense.  Squeezing your shoulder blades together, pull your hands down to the sides of your thighs. Do not allow your hands to go behind you.  Hold for __________ seconds. Slowly ease the tension on the band, as you reverse the directions and return to the starting position. Repeat __________ times. Complete this exercise __________ times per day.  STRENGTH - Scapular Retractors and External  Rotators   Secure a rubber exercise band or tubing to a fixed object (table, pole) so that it is at the height as your shoulders, when you are either standing, or sitting on a firm armless chair.  With a palm down grip, grasp an end of the band in each hand. Bend your elbows 90 degrees and lift your elbows to shoulder height, at your sides. Step back, away from the secured end of the band, until it becomes tense.  Squeezing your shoulder blades together, rotate your shoulders so that your upper arms and elbows remain stationary, but your fists travel upward to head height.  Hold for __________ seconds. Slowly ease the tension on the band, as you reverse the directions and return to the starting position. Repeat __________ times. Complete this exercise __________ times per day.  STRENGTH - Scapular Retractors and External Rotators, Rowing   Secure a rubber exercise band or tubing to a fixed object (table, pole) so that it is at the height of your shoulders, when you are either standing, or sitting on a firm armless chair.  With a palm down grip, grasp an end of the band in each hand. Straighten your elbows and lift your hands straight in front of you, at shoulder height. Step back, away from the secured end of the band, until it becomes tense.  Step 1: Squeeze your shoulder blades together. Bending your elbows, draw your hands to your chest, as if you are rowing a boat. At the end of this motion, your hands and elbow should be at shoulder height and your elbows should be out to your sides.  Step 2: Rotate your shoulders, to raise your hands above your head. Your forearms should be vertical and your upper arms should be horizontal.  Hold for __________ seconds.  Slowly ease the tension on the band, as you reverse the directions and return to the starting position. Repeat __________ times. Complete this exercise __________ times per day.  STRENGTH - Scapular Depressors  Find a sturdy chair  without wheels, such as a dining room chair.  Keeping your feet on the floor, and your hands on the chair arms, lift your bottom up from the seat, and lock your elbows.  Keeping your elbows straight, allow gravity to pull your body weight down. Your shoulders will rise toward your ears.  Raise your body against gravity by drawing your shoulder blades down your back, shortening the distance between your shoulders and ears. Although your feet should always maintain contact with the floor, your feet should progressively support less body weight, as you get stronger.  Hold for __________ seconds. In a controlled and slow manner, lower your body weight to begin the next repetition. Repeat __________ times. Complete this exercise __________ times per day.    This information is not intended to replace advice given to you by your health care provider. Make sure you discuss any questions you have with your health care provider.   Document Released: 06/13/2005 Document Revised: 07/04/2014 Document Reviewed: 09/25/2008 [employer] Interactive Patient Education [employer].

## 2015-04-02 NOTE — Telephone Encounter (Signed)
Pt still having arm pain. appt made w/Dr. Georgina Snell.[facility]

## 2015-04-03 DIAGNOSIS — M62838 Other muscle spasm: Secondary | ICD-10-CM | POA: Insufficient documentation

## 2015-04-03 NOTE — Progress Notes (Signed)
Subjective:    I'm seeing this patient as a consultation for:  Dr. Madilyn Fireman  CC: Right shoulder pain.   HPI: Patient has ongoing right shoulder pain present for around 2 months. She's had a trial of home exercise program oral anti-inflammatory disease and relative rest. She denies any specific injury. She denies any radiating pain weakness or numbness. She notes her pain is predominantly in the lateral upper arm and worse with overhead motion reaching back. However she does note some pain in the right trapezius and right periscapular area. She notes also pain with shoulder shrug. She denies any significant neck pain and pain radiating below the elbow weakness or numbness. She denies any fevers chills nausea vomiting or diarrhea.  Past medical history, Surgical history, Family history not pertinant except as noted below, Social history, Allergies, and medications have been entered into the medical record, reviewed, and no changes needed.   Review of Systems: No headache, visual changes, nausea, vomiting, diarrhea, constipation, dizziness, abdominal pain, skin rash, fevers, chills, night sweats, weight loss, swollen lymph nodes, body aches, joint swelling, muscle aches, chest pain, shortness of breath, mood changes, visual or auditory hallucinations.   Objective:    Filed Vitals:   04/02/15 1508  BP: 166/81  Pulse: 64   General: Well Developed, well nourished, and in no acute distress.  Neuro/Psych: Alert and oriented x3, extra-ocular muscles intact, able to move all 4 extremities, sensation grossly intact. Skin: Warm and dry, no rashes noted.  Respiratory: Not using accessory muscles, speaking in full sentences, trachea midline.  Cardiovascular: Pulses palpable, no extremity edema. Abdomen: Does not appear distended. MSK: Neck: Nontender to midline. Mildly tender to palpation right trapezius. Neck range of motion is normal with a negative Spurling's test. Shoulder: Tender to palpation  in the trapezius and rhomboid of the right. Full passive range of motion however patient has pain beyond 120 abduction. She has pain with active motion beyond 100 abduction. She has normal external and internal motion of her pain with external rotation beyond 60 the of the neutral position. She has positive Hawkins and Neer's impingement testing. Her strength is intact but slightly diminished 4+/5 abduction on the right. 5/5 external and internal rotation strength. Pulses capillary refill sensation intact distally.  Procedure: Real-time Ultrasound Guided Injection of right subacromial bursa  Device: GE Logiq E  Images permanently stored and available for review in the ultrasound unit. Verbal informed consent obtained. Discussed risks and benefits of procedure. Warned about infection bleeding damage to structures skin hypopigmentation and fat atrophy among others. Patient expresses understanding and agreement Time-out conducted.  Noted no overlying erythema, induration, or other signs of local infection.  Skin prepped in a sterile fashion.  Local anesthesia: Topical Ethyl chloride.  With sterile technique and under real time ultrasound guidance: 40 mg of Kenalog and 4 mL of Marcaine injected easily.  Completed without difficulty  Pain improved after injection suggesting accurate placement of the medication.  Advised to call if fevers/chills, erythema, induration, drainage, or persistent bleeding.  Images permanently stored and available for review in the ultrasound unit.  Impression: Technically successful ultrasound guided injection.    No results found for this or any previous visit (from the past 24 hour(s)). Dg Shoulder Right  04/02/2015   CLINICAL DATA:  Right shoulder pain for several months  EXAM: RIGHT SHOULDER - 2+ VIEW  COMPARISON:  None.  FINDINGS: Right humeral head is in normal position and the glenohumeral joint space is unremarkable. The right [facility]  joint is  normally aligned. No bony abnormality is seen.  IMPRESSION: Negative.   Electronically Signed   By: Ivar Drape M.D.   On: 04/02/2015 16:56    Impression and Recommendations:   This case required medical decision making of moderate complexity.

## 2015-04-03 NOTE — Progress Notes (Signed)
Quick Note:  Xray normal. ______ 

## 2015-04-03 NOTE — Assessment & Plan Note (Signed)
Shoulder pain is likely due to subacromial bursitis and trapezius spasm. Injection today. Additionally treat with tramadol and referral to physical therapy. Recheck in 3 weeks.

## 2015-04-07 ENCOUNTER — Telehealth: Payer: Self-pay | Admitting: *Deleted

## 2015-04-07 NOTE — Telephone Encounter (Signed)
Pt called today and stated that she had the xray done and had an injection done by Dr. Georgina Snell on 04/02/15 she is not getting any relief. She is unable to work and it is affecting her handwriting. She was given some additional pain medication which helps her to sleep however she cannot take this during the day because it causes her to sleep. She has been using cold and warm compresses, and doing the stretches. She gets relief when she is in the shower and the warm water runs on it.she wonders if she will need to have an MRI done? She really wants to haveShe wants to know what she should do now. Will fwd to pcp for f/u.Audelia Hives [place]

## 2015-04-08 NOTE — Telephone Encounter (Signed)
Follow up with me soon. This will need further eval.

## 2015-04-08 NOTE — Telephone Encounter (Signed)
Will forward to Dr Corey 

## 2015-04-09 NOTE — Telephone Encounter (Signed)
Pt informed appt made for 04/13/2015 @ 845.Audelia Hives [place]

## 2015-04-13 ENCOUNTER — Ambulatory Visit (INDEPENDENT_AMBULATORY_CARE_PROVIDER_SITE_OTHER): Payer: 59

## 2015-04-13 ENCOUNTER — Encounter: Payer: Self-pay | Admitting: Family Medicine

## 2015-04-13 ENCOUNTER — Ambulatory Visit (INDEPENDENT_AMBULATORY_CARE_PROVIDER_SITE_OTHER): Payer: 59 | Admitting: Family Medicine

## 2015-04-13 VITALS — BP 163/93 | HR 64 | Wt 174.0 lb

## 2015-04-13 DIAGNOSIS — M6248 Contracture of muscle, other site: Secondary | ICD-10-CM | POA: Diagnosis not present

## 2015-04-13 DIAGNOSIS — M7711 Lateral epicondylitis, right elbow: Secondary | ICD-10-CM | POA: Diagnosis not present

## 2015-04-13 DIAGNOSIS — M62838 Other muscle spasm: Secondary | ICD-10-CM

## 2015-04-13 DIAGNOSIS — M542 Cervicalgia: Secondary | ICD-10-CM

## 2015-04-13 DIAGNOSIS — M7710 Lateral epicondylitis, unspecified elbow: Secondary | ICD-10-CM | POA: Insufficient documentation

## 2015-04-13 MED ORDER — METHOCARBAMOL 500 MG PO TABS: 500.0000 mg | ORAL_TABLET | Freq: Three times a day (TID) | ORAL | Status: DC

## 2015-04-13 MED ORDER — TRAMADOL HCL 50 MG PO TABS: 50.0000 mg | ORAL_TABLET | Freq: Three times a day (TID) | ORAL | Status: DC | PRN

## 2015-04-13 NOTE — Progress Notes (Signed)
Quick Note:  Xray shows arthritis like we thought. ______ 

## 2015-04-13 NOTE — Patient Instructions (Signed)
Thank you for coming in today. Return in 2 weeks X-ray and attend physical therapy  Lateral Epicondylitis With Rehab Lateral epicondylitis involves inflammation and pain around the outer portion of the elbow. The pain is caused by inflammation of the tendons in the forearm that bring back (extend) the wrist. Lateral epicondylitis is also called tennis elbow, because it is very common in tennis players. However, it may occur in any individual who extends the wrist repetitively. If lateral epicondylitis is left untreated, it may become a chronic problem. SYMPTOMS   Pain, tenderness, and inflammation on the outer (lateral) side of the elbow.  Pain or weakness with gripping activities.  Pain that increases with wrist-twisting motions (playing tennis, using a screwdriver, opening a door or a jar).  Pain with lifting objects, including a coffee cup. CAUSES  Lateral epicondylitis is caused by inflammation of the tendons that extend the wrist. Causes of injury may include:  Repetitive stress and strain on the muscles and tendons that extend the wrist.  Sudden change in activity level or intensity.  Incorrect grip in racquet sports.  Incorrect grip size of racquet (often too large).  Incorrect hitting position or technique (usually backhand, leading with the elbow).  Using a racket that is too heavy. RISK INCREASES WITH:  Sports or occupations that require repetitive and/or strenuous forearm and wrist movements (tennis, squash, racquetball, carpentry).  Poor wrist and forearm strength and flexibility.  Failure to warm up properly before activity.  Resuming activity before healing, rehabilitation, and conditioning are complete. PREVENTION   Warm up and stretch properly before activity.  Maintain physical fitness:  Strength, flexibility, and endurance.  Cardiovascular fitness.  Wear and use properly fitted equipment.  Learn and use proper technique and have a coach correct  improper technique.  Wear a tennis elbow (counterforce) brace. PROGNOSIS  The course of this condition depends on the degree of the injury. If treated properly, acute cases (symptoms lasting less than 4 weeks) are often resolved in 2 to 6 weeks. Chronic (longer lasting cases) often resolve in 3 to 6 months but may require physical therapy. RELATED COMPLICATIONS   Frequently recurring symptoms, resulting in a chronic problem. Properly treating the problem the first time decreases frequency of recurrence.  Chronic inflammation, scarring tendon degeneration, and partial tendon tear, requiring surgery.  Delayed healing or resolution of symptoms. TREATMENT  Treatment first involves the use of ice and medicine to reduce pain and inflammation. Strengthening and stretching exercises may help reduce discomfort if performed regularly. These exercises may be performed at home if the condition is an acute injury. Chronic cases may require a referral to a physical therapist for evaluation and treatment. Your caregiver may advise a corticosteroid injection to help reduce inflammation. Rarely, surgery is needed. MEDICATION  If pain medicine is needed, nonsteroidal anti-inflammatory medicines (aspirin and ibuprofen), or other minor pain relievers (acetaminophen), are often advised.  Do not take pain medicine for 7 days before surgery.  Prescription pain relievers may be given, if your caregiver thinks they are needed. Use only as directed and only as much as you need.  Corticosteroid injections may be recommended. These injections should be reserved only for the most severe cases, because they can only be given a certain number of times. HEAT AND COLD  Cold treatment (icing) should be applied for 10 to 15 minutes every 2 to 3 hours for inflammation and pain, and immediately after activity that aggravates your symptoms. Use ice packs or an ice massage.  Heat  treatment may be used before performing  stretching and strengthening activities prescribed by your caregiver, physical therapist, or athletic trainer. Use a heat pack or a warm water soak. SEEK MEDICAL CARE IF: Symptoms get worse or do not improve in 2 weeks, despite treatment. EXERCISES  RANGE OF MOTION (ROM) AND STRETCHING EXERCISES - Epicondylitis, Lateral (Tennis Elbow) These exercises may help you when beginning to rehabilitate your injury. Your symptoms may go away with or without further involvement from your physician, physical therapist, or athletic trainer. While completing these exercises, remember:   Restoring tissue flexibility helps normal motion to return to the joints. This allows healthier, less painful movement and activity.  An effective stretch should be held for at least 30 seconds.  A stretch should never be painful. You should only feel a gentle lengthening or release in the stretched tissue. RANGE OF MOTION - Wrist Flexion, Active-Assisted  Extend your right / left elbow with your fingers pointing down.*  Gently pull the back of your hand towards you, until you feel a gentle stretch on the top of your forearm.  Hold this position for __________ seconds. Repeat __________ times. Complete this exercise __________ times per day.  *If directed by your physician, physical therapist or athletic trainer, complete this stretch with your elbow bent, rather than extended. RANGE OF MOTION - Wrist Extension, Active-Assisted  Extend your right / left elbow and turn your palm upwards.*  Gently pull your palm and fingertips back, so your wrist extends and your fingers point more toward the ground.  You should feel a gentle stretch on the inside of your forearm.  Hold this position for __________ seconds. Repeat __________ times. Complete this exercise __________ times per day. *If directed by your physician, physical therapist or athletic trainer, complete this stretch with your elbow bent, rather than  extended. STRETCH - Wrist Flexion  Place the back of your right / left hand on a tabletop, leaving your elbow slightly bent. Your fingers should point away from your body.  Gently press the back of your hand down onto the table by straightening your elbow. You should feel a stretch on the top of your forearm.  Hold this position for __________ seconds. Repeat __________ times. Complete this stretch __________ times per day.  STRETCH - Wrist Extension   Place your right / left fingertips on a tabletop, leaving your elbow slightly bent. Your fingers should point backwards.  Gently press your fingers and palm down onto the table by straightening your elbow. You should feel a stretch on the inside of your forearm.  Hold this position for __________ seconds. Repeat __________ times. Complete this stretch __________ times per day.  STRENGTHENING EXERCISES - Epicondylitis, Lateral (Tennis Elbow) These exercises may help you when beginning to rehabilitate your injury. They may resolve your symptoms with or without further involvement from your physician, physical therapist, or athletic trainer. While completing these exercises, remember:   Muscles can gain both the endurance and the strength needed for everyday activities through controlled exercises.  Complete these exercises as instructed by your physician, physical therapist or athletic trainer. Increase the resistance and repetitions only as guided.  You may experience muscle soreness or fatigue, but the pain or discomfort you are trying to eliminate should never worsen during these exercises. If this pain does get worse, stop and make sure you are following the directions exactly. If the pain is still present after adjustments, discontinue the exercise until you can discuss the trouble with your caregiver.  STRENGTH - Wrist Flexors  Sit with your right / left forearm palm-up and fully supported on a table or countertop. Your elbow should be  resting below the height of your shoulder. Allow your wrist to extend over the edge of the surface.  Loosely holding a __________ weight, or a piece of rubber exercise band or tubing, slowly curl your hand up toward your forearm.  Hold this position for __________ seconds. Slowly lower the wrist back to the starting position in a controlled manner. Repeat __________ times. Complete this exercise __________ times per day.  STRENGTH - Wrist Extensors  Sit with your right / left forearm palm-down and fully supported on a table or countertop. Your elbow should be resting below the height of your shoulder. Allow your wrist to extend over the edge of the surface.  Loosely holding a __________ weight, or a piece of rubber exercise band or tubing, slowly curl your hand up toward your forearm.  Hold this position for __________ seconds. Slowly lower the wrist back to the starting position in a controlled manner. Repeat __________ times. Complete this exercise __________ times per day.  STRENGTH - Ulnar Deviators  Stand with a ____________________ weight in your right / left hand, or sit while holding a rubber exercise band or tubing, with your healthy arm supported on a table or countertop.  Move your wrist, so that your pinkie travels toward your forearm and your thumb moves away from your forearm.  Hold this position for __________ seconds and then slowly lower the wrist back to the starting position. Repeat __________ times. Complete this exercise __________ times per day STRENGTH - Radial Deviators  Stand with a ____________________ weight in your right / left hand, or sit while holding a rubber exercise band or tubing, with your injured arm supported on a table or countertop.  Raise your hand upward in front of you or pull up on the rubber tubing.  Hold this position for __________ seconds and then slowly lower the wrist back to the starting position. Repeat __________ times. Complete this  exercise __________ times per day. STRENGTH - Forearm Supinators   Sit with your right / left forearm supported on a table, keeping your elbow below shoulder height. Rest your hand over the edge, palm down.  Gently grip a hammer or a soup ladle.  Without moving your elbow, slowly turn your palm and hand upward to a "thumbs-up" position.  Hold this position for __________ seconds. Slowly return to the starting position. Repeat __________ times. Complete this exercise __________ times per day.  STRENGTH - Forearm Pronators   Sit with your right / left forearm supported on a table, keeping your elbow below shoulder height. Rest your hand over the edge, palm up.  Gently grip a hammer or a soup ladle.  Without moving your elbow, slowly turn your palm and hand upward to a "thumbs-up" position.  Hold this position for __________ seconds. Slowly return to the starting position. Repeat __________ times. Complete this exercise __________ times per day.  STRENGTH - Grip  Grasp a tennis ball, a dense sponge, or a large, rolled sock in your hand.  Squeeze as hard as you can, without increasing any pain.  Hold this position for __________ seconds. Release your grip slowly. Repeat __________ times. Complete this exercise __________ times per day.  STRENGTH - Elbow Extensors, Isometric  Stand or sit upright, on a firm surface. Place your right / left arm so that your palm faces your stomach, and  it is at the height of your waist.  Place your opposite hand on the underside of your forearm. Gently push up as your right / left arm resists. Push as hard as you can with both arms, without causing any pain or movement at your right / left elbow. Hold this stationary position for __________ seconds. Gradually release the tension in both arms. Allow your muscles to relax completely before repeating.   This information is not intended to replace advice given to you by your health care provider. Make sure  you discuss any questions you have with your health care provider.   Document Released: 06/13/2005 Document Revised: 07/04/2014 Document Reviewed: 09/25/2008 Elsevier Interactive Patient Education [employer].

## 2015-04-13 NOTE — Assessment & Plan Note (Signed)
I believe the majority of the pain today is due to trapezius spasm. Cervical x-ray pending. Refer to physical therapy. Continue tramadol. Also start methocarbamol. Recheck 2 weeks.

## 2015-04-13 NOTE — Assessment & Plan Note (Signed)
Patient given a wrist brace. Discussed home exercises. Refer to physical therapy for exercises as well as iontophoresis

## 2015-04-13 NOTE — Progress Notes (Signed)
Samantha Price is a 54 y.o. female who presents to [facility]  today for right shoulder pain. Patient was last seen on October 6 where she received a subacromial injection. This did not help much at all. She notes the pain she was feeling on the lateral shoulder is slightly improved. She notes severe trapezius pain. The pain in her trapezius is severe and interfering with sleep. It is worse with motion of her neck. Additionally she notes pain in her right lateral elbow is been present for sometime as well. This is worse with wrist motion. She denies any fevers chills nausea vomiting or diarrhea. She has tramadol as helps some but it makes her sleepy. She has had trial of NSAIDs which did not help at all.   Past Medical History  Diagnosis Date  . Diabetes mellitus     type 2  . Migraines   . Fibromyalgia   . Cyst     drained from Rt breast  . Herniated disc   . Wears glasses   . Anemia    Past Surgical History  Procedure Laterality Date  . Appendectomy    . Breast cyst excison      Left,   . Tubal ligation    . Colonoscopy w/ polypectomy      2007, Dr. Oletta Lamas.     Social History  Substance Use Topics  . Smoking status: Never Smoker   . Smokeless tobacco: Not on file  . Alcohol Use: Yes   family history includes Breast cancer in an other family member; Cancer in her father; Colon cancer in her other; Diabetes in her mother; Heart disease in her other; Leukemia in an other family member; Stroke in an other family member.  ROS as above Medications: Current Outpatient Prescriptions  Medication Sig Dispense Refill  . AMBULATORY NON FORMULARY MEDICATION Medication Name: Glucometer with strips and lancets to test 2-3 time per week. 1 Units PRN  . aspirin 81 MG tablet Take 81 mg by mouth daily.      . betamethasone dipropionate (DIPROLENE) 0.05 % cream Apply topically 2 (two) times daily. To affected area right leg 45 g 0  . Ginkgo Biloba 40  MG TABS Take 40 mg by mouth daily.      . Linagliptin-Metformin HCl (JENTADUETO) 2.10-998 MG TABS Take 1 tablet by mouth 2 (two) times daily. 60 tablet 6  . lisinopril-hydrochlorothiazide (ZESTORETIC) 20-12.5 MG per tablet Take 1 tablet by mouth 2 (two) times daily. 60 tablet 6  . Multiple Vitamin (MULTIVITAMIN PO) Take by mouth daily.      Marland Kitchen topiramate (TOPAMAX) 50 MG tablet Take 1 tablet (50 mg total) by mouth 2 (two) times daily. 60 tablet 5  . traMADol (ULTRAM) 50 MG tablet Take 1 tablet (50 mg total) by mouth every 8 (eight) hours as needed. 30 tablet 0  . methocarbamol (ROBAXIN) 500 MG tablet Take 1 tablet (500 mg total) by mouth 3 (three) times daily. 90 tablet 0   No current facility-administered medications for this visit.   No Known Allergies   Exam:  BP 163/93 mmHg  Pulse 64  Wt 174 lb (78.926 kg)  LMP 06/28/2011 Gen: Well NAD HEENT: EOMI,  MMM Lungs: Normal work of breathing. CTABL Heart: RRR no MRG Abd: NABS, Soft. Nondistended, Nontender Exts: Brisk capillary refill, warm and well perfused.  Neck: Nontender to midline. Tender palpation right trapezius and periscapular muscles. Motion is normal with flexion and extension. Left lateral flexion  is normal right is reduced. Rotation to the right is also reduced. Left rotation is normal. Pain with shoulder shrug.  Shoulder nontender to motion. Normal range of motion. Minimally positive impingement testing. Strength is intact.  Elbow: Tender to palpation lateral upper condyle. Normal elbow motion. Pain with resisted wrist extension and grip. Wrists nontender normal pulses capillary refill sensation.   X-ray shoulder October 6 reviewed  No results found for this or any previous visit (from the past 24 hour(s)). No results found.   Please see individual assessment and plan sections.

## 2015-04-15 ENCOUNTER — Encounter: Payer: Self-pay | Admitting: Rehabilitative and Restorative Service Providers"

## 2015-04-15 ENCOUNTER — Ambulatory Visit (INDEPENDENT_AMBULATORY_CARE_PROVIDER_SITE_OTHER): Payer: 59 | Admitting: Rehabilitative and Restorative Service Providers"

## 2015-04-15 DIAGNOSIS — M79601 Pain in right arm: Secondary | ICD-10-CM

## 2015-04-15 DIAGNOSIS — Z7409 Other reduced mobility: Secondary | ICD-10-CM

## 2015-04-15 DIAGNOSIS — M623 Immobility syndrome (paraplegic): Secondary | ICD-10-CM

## 2015-04-15 DIAGNOSIS — M542 Cervicalgia: Secondary | ICD-10-CM | POA: Diagnosis not present

## 2015-04-15 DIAGNOSIS — M2560 Stiffness of unspecified joint, not elsewhere classified: Secondary | ICD-10-CM

## 2015-04-15 NOTE — Patient Instructions (Signed)
TENS unit for pain management  Ball release work using 4 inch rubber ball  Lying down 5-10 min each hour during the day when possible  Axial Extension (Chin Tuck)    Pull chin in and lengthen back of neck. Hold __10-15__ seconds while counting out loud. Repeat __5-10__ times. Do several  sessions per day. Do this lying down as well as sitting and standing It is great to do while lying down so your neck muscles are relaxed   Scapular Retraction (Standing)    With arms at sides, pinch shoulder blades down and back. Hold 10 - 15 sec  Repeat __10__ times per set.  Do __several__ sessions per day.   Lying on back; nodding head yes/no 10-20 reps

## 2015-04-15 NOTE — Therapy (Signed)
[place], [place], [address] Phone: [phone]   Fax:  [phone]  Physical Therapy Evaluation  Patient Details  Name: Samantha Price MRN: [id number] Date of Birth: [birth year] Referring Provider: Dr. Lynne Leader  Encounter Date: 04/15/2015      PT End of Session - 04/15/15 1236    Visit Number 1   Number of Visits 18   Date for PT Re-Evaluation 05/27/15   PT Start Time 1107   PT Stop Time 1212   PT Time Calculation (min) 65 min   Activity Tolerance Patient limited by pain      Past Medical History  Diagnosis Date  . Diabetes mellitus     type 2  . Migraines   . Fibromyalgia   . Cyst     drained from Rt breast  . Herniated disc   . Wears glasses   . Anemia     Past Surgical History  Procedure Laterality Date  . Appendectomy    . Breast cyst excison      Left,   . Tubal ligation    . Colonoscopy w/ polypectomy      2007, Dr. Oletta Lamas.      There were no vitals filed for this visit.  Visit Diagnosis:  Cervical pain (neck) - Plan: PT plan of care cert/re-cert  Pain, arm, right - Plan: PT plan of care cert/re-cert  Stiffness due to immobility - Plan: PT plan of care cert/re-cert  Impaired functional mobility and endurance - Plan: PT plan of care cert/re-cert      Subjective Assessment - 04/15/15 1113    Subjective Patient reports onset of neck, upper back and arm pain 05/16. She was seen by MD and was given exercises and medication with some improvement. She was seen by Dr. Georgina Snell and received injection Rt shoulder. Pain continues to increase. She has difficulty moving head/neck; lifting Rt arm; using Rt UE even her handwriting is effected.    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 not at all   How long can you stand comfortably? not at all   How long can you walk comfortably? not at all   Diagnostic  tests xrays   Patient Stated Goals get rid of pain and return to normal life   Currently in Pain? Yes   Pain Score 10-Worst pain ever   Pain Location Shoulder   Pain Orientation Right   Pain Descriptors / Indicators Throbbing;Stabbing   Pain Type Acute pain   Pain Radiating Towards radiating into the Rt shoulder and arm; pain iinto the Rt elbow and forearm    Pain Onset More than a month ago   Pain Frequency Constant   Aggravating Factors  movement; use of Rt UE; lying down; unable to sleep; meds are not working   Pain Relieving Factors nothing             [facility] PT Assessment - 04/15/15 0001    Assessment   Medical Diagnosis Rt trapezius spasms; lateral epidondylitis   Referring Provider Dr. Lynne Leader   Onset Date/Surgical Date 11/10/14   Hand Dominance Right   Next MD Visit 04/27/15   Prior Therapy none for current problems    Precautions   Precautions None   Balance Screen   Has the patient fallen in the past 6 months No   Has the patient had a decrease in activity  level because of a fear of falling?  No   Is the patient reluctant to leave their home because of a fear of falling?  No   Home Environment   Additional Comments no difficulty in/out of home    Prior Function   Level of Independence Independent   Vocation Full time employment   Vocation Requirements working from home at [employer]    Leisure household chores;cooking; laundry; walking dog; gardening   Observation/Other Assessments   Focus on Therapeutic Outcomes (FOTO)  73% limitation    Sensation   Additional Comments c/o numbness and tingling in Rt arm    Posture/Postural Control   Posture Comments head forward; shoulders rounded and elevated; scapulae abducted and rotated along the thoracic spine; incresaed thoracic kyphosis; elevated upper traps/shds; UE's in IR at sides   AROM   Right/Left Shoulder --  Lt WFL's throughout   Right Shoulder Extension 39 Degrees  painful   Right Shoulder Flexion  102 Degrees  painful   Right Shoulder ABduction 78 Degrees  painful   Right Shoulder External Rotation 24 Degrees  in neutral at side - painful   Right/Left Elbow --  Lt WFL's    Right Elbow Flexion 122  painful   Right Elbow Extension --  full/minimal pain   Right/Left Forearm --  WFL's bilat painful on Rt supination/pronation   Right/Left Wrist --  Lt WFL's   Right Wrist Extension 35 Degrees  painful   Right Wrist Flexion 40 Degrees  painful   Cervical Flexion 40   Cervical Extension 42  pain   Cervical - Right Side Bend 24  pain   Cervical - Left Side Bend 31  pain less than Rt lat flexion   Cervical - Right Rotation 45   Cervical - Left Rotation 51   Strength   Overall Strength Comments unable to assess strength due to pain level 10/10 - pt moving bilat UE's against gravity - painful   Palpation   Palpation comment tenderness to light pressure through Rt upper quadrant - ant/lat/post cervical musculature; upper trap; leveator; teres; rhomboids; pecs; extensor forearm                   [facility] Adult PT Treatment/Exercise - 04/15/15 0001    Therapeutic Activites    Therapeutic Activities --  myofacial ball release work for home   Neuro Re-ed    Neuro Re-ed Details  began working on improved posture and alignment   Neck Exercises: Standing   Other Standing Exercises chest lift/scap squeeze 10 sec hold 5 reps    Neck Exercises: Supine   Neck Retraction Limitations chin tuck 10 sec hold 5 reps    Other Supine Exercise nodding yes/no   Moist Heat Therapy   Number Minutes Moist Heat 15 Minutes   Moist Heat Location Cervical  thoracic spine/Rt UE   Electrical Stimulation   Electrical Stimulation Location cervical area/shoulder/ lateral epicondyle   Electrical Stimulation Action IFC   Electrical Stimulation Parameters to tolerance   Electrical Stimulation Goals Pain  muscle relaxation   Iontophoresis   Type of Iontophoresis Dexamethasone   Location Rt  lateral epicondylitis   Dose 40 mAmp   Time extended wear                PT Education - 04/15/15 1156    Education provided Yes   Education Details muscle tightness and pain; need to release the tightness in neck and shoulders; started postural correction; myofacila ball release  work; Production manager) Educated Patient   Methods Explanation;Demonstration;Tactile cues;Verbal cues;Handout   Comprehension Verbalized understanding;Returned demonstration;Verbal cues required;Tactile cues required             PT Long Term Goals - 04/15/15 1242    PT LONG TERM GOAL #1   Title Improve ROM/mobility through cervical spine and Rt shoulder/wrist 05/27/15   Time 6   Period Weeks   Status New   PT LONG TERM GOAL #2   Title Decrease pain by 50% for 70-75% of the day, allowing patient to rest and work with greater ease and less difficulty 05/27/15   Time 6   Period Weeks   Status New   PT LONG TERM GOAL #3   Title Improve posture and alignment with patient demonstrating appropriate upright posture in standing and sitting 05/27/15   Time 6   Period Weeks   Status New   PT LONG TERM GOAL #4   Title Patient to tolerate sitting/standing/walking for 10-15 min with pain of 0/10 to 3/10 07/26/14   Time 6   Period Weeks   Status New   PT LONG TERM GOAL #5   Title Improve FOTO to </= 42% limitation 05/27/15   Time 6   Period Weeks   Status New               Plan - 04/15/15 1237    Clinical Impression Statement Pt presents with severe pain and limitations. She has poor posture and alignment; decresaed ROM and active movement through cervical spine and Rt UE; pain preohibiting strength testing; limited functional ability due to pain. She will benefit form treatment to address problems as identified and improe functional activity level.    Pt will benefit from skilled therapeutic intervention in order to improve on the following deficits Postural dysfunction;Improper body  mechanics;Pain;Decreased range of motion;Decreased mobility;Increased fascial restricitons;Decreased endurance;Decreased activity tolerance   Rehab Potential Good   PT Frequency 3x / week   PT Duration 6 weeks   PT Treatment/Interventions Patient/family education;ADLs/Self Care Home Management;Therapeutic exercise;Therapeutic activities;Manual techniques;Dry needling;Neuromuscular re-education;Iontophoresis 4mg /ml Dexamethasone;Cryotherapy;Electrical Stimulation;Moist Heat;Ultrasound   PT Next Visit Plan continue education re chronic muscular pain and dysfunction; breaking the pain cycle; gentle movement and postural correction to begin stretching. Further assess strength as indicated and necessary.   PT Home Exercise Plan postural correctioin; ergonomics for resting; recommendations for resting head/neck/shoulders during the day; myofacial ball release work; HEP; provided info for purchase of TENS unit   Consulted and Agree with Plan of Care Patient         Problem List Patient Active Problem List   Diagnosis Date Noted  . Lateral epicondylitis 04/13/2015  . Trapezius muscle spasm 04/03/2015  . Hives 07/31/2013  . Essential hypertension, benign 11/26/2010  . NEOPLASMS UNSPEC NATURE BONE SOFT TISSUE&SKIN 11/15/2010  . Diabetes type 2, controlled ([place]) 12/01/2009  . Migraine 12/01/2009  . Headache 12/01/2009    Demarlo Riojas Nilda Simmer PT, MPH 04/15/2015, 12:49 PM  [facility] [place], [place], [address] Phone: [phone]   Fax:  [phone]  Name: ARASELI SHERRY MRN: [id number] Date of Birth: 26-Jun-1961

## 2015-04-17 ENCOUNTER — Ambulatory Visit (INDEPENDENT_AMBULATORY_CARE_PROVIDER_SITE_OTHER): Payer: 59 | Admitting: Rehabilitative and Restorative Service Providers"

## 2015-04-17 ENCOUNTER — Encounter: Payer: Self-pay | Admitting: Rehabilitative and Restorative Service Providers"

## 2015-04-17 DIAGNOSIS — M79601 Pain in right arm: Secondary | ICD-10-CM

## 2015-04-17 DIAGNOSIS — Z7409 Other reduced mobility: Secondary | ICD-10-CM

## 2015-04-17 DIAGNOSIS — M542 Cervicalgia: Secondary | ICD-10-CM

## 2015-04-17 DIAGNOSIS — M623 Immobility syndrome (paraplegic): Secondary | ICD-10-CM | POA: Diagnosis not present

## 2015-04-17 DIAGNOSIS — M2560 Stiffness of unspecified joint, not elsewhere classified: Secondary | ICD-10-CM

## 2015-04-17 NOTE — Therapy (Signed)
[place], [place], [address] Phone: [phone]   Fax:  [phone]  Physical Therapy Treatment  Patient Details  Name: Samantha Price MRN: [id number] Date of Birth: 09-Nov-1960 Referring Provider: Dr. Lynne Leader  Encounter Date: 04/17/2015      PT End of Session - 04/17/15 1628    Visit Number 2   Number of Visits 18   Date for PT Re-Evaluation 05/27/15   PT Start Time 4097   PT Stop Time 3532   PT Time Calculation (min) 48 min   Activity Tolerance Patient limited by pain;Patient tolerated treatment well      Past Medical History  Diagnosis Date  . Diabetes mellitus     type 2  . Migraines   . Fibromyalgia   . Cyst     drained from Rt breast  . Herniated disc   . Wears glasses   . Anemia     Past Surgical History  Procedure Laterality Date  . Appendectomy    . Breast cyst excison      Left,   . Tubal ligation    . Colonoscopy w/ polypectomy      2007, Dr. Oletta Lamas.      There were no vitals filed for this visit.  Visit Diagnosis:  Cervical pain (neck)  Pain, arm, right  Stiffness due to immobility  Impaired functional mobility and endurance      Subjective Assessment - 04/17/15 1545    Subjective Patient reports that she is still in pain. She talked to the MD who told her that we were not miracle workers. She is taking pain medication but continues to have pain. She is working on her posture and HEP   Currently in Pain? Yes   Pain Score 9    Pain Location Shoulder   Pain Orientation Right   Pain Descriptors / Indicators Throbbing;Stabbing   Pain Type Acute pain                         [facility] Adult PT Treatment/Exercise - 04/17/15 0001    Neuro Re-ed    Neuro Re-ed Details  working on improved posture and alignment   Exercises   Exercises --  UE movement standing for relaxation/movement   Neck Exercises: Standing   Neck Retraction 10  reps;10 secs   Other Standing Exercises chest lift/scap squeeze 10 sec hold 10 reps    Neck Exercises: Supine   Neck Retraction Limitations chin tuck 10 sec hold 5 reps    Other Supine Exercise nodding yes/no   Other Supine Exercise rotation Rt/Lt 5 x each side   Moist Heat Therapy   Number Minutes Moist Heat 15 Minutes   Moist Heat Location Cervical   Electrical Stimulation   Electrical Stimulation Location cervical area/shoulder/ lateral epicondyle   Electrical Stimulation Action IFC   Electrical Stimulation Parameters to tolerance   Electrical Stimulation Goals Pain;Other (comment)  muscular tightness   Iontophoresis   Type of Iontophoresis Dexamethasone   Location Rt lateral epicondylitis   Dose 40 mAmp   Time extended wear   Manual Therapy   Joint Mobilization gentle c-spine   Soft tissue mobilization ant/lat/post cervical; pecs; upper trap; periscapular; extensor forearm   Myofascial Release chest; cervical area   Scapular Mobilization Rt   Manual Traction cervical   Neck Exercises: Stretches   Neck Stretch --  sitting w/ chest lift - lat flex 10 sec 3  reps Systems developer --  doorway stretch 2 lower positions 20 sec hold 3 reps                 PT Education - 04/17/15 1628    Education provided Yes   Education Details muscular dysfunction; HEP   Person(s) Educated Patient   Methods Explanation;Demonstration;Tactile cues;Verbal cues;Handout   Comprehension Verbalized understanding;Returned demonstration;Verbal cues required;Tactile cues required             PT Long Term Goals - 04/15/15 1242    PT LONG TERM GOAL #1   Title Improve ROM/mobility through cervical spine and Rt shoulder/wrist 05/27/15   Time 6   Period Weeks   Status New   PT LONG TERM GOAL #2   Title Decrease pain by 50% for 70-75% of the day, allowing patient to rest and work with greater ease and less difficulty 05/27/15   Time 6   Period Weeks   Status New   PT LONG TERM  GOAL #3   Title Improve posture and alignment with patient demonstrating appropriate upright posture in standing and sitting 05/27/15   Time 6   Period Weeks   Status New   PT LONG TERM GOAL #4   Title Patient to tolerate sitting/standing/walking for 10-15 min with pain of 0/10 to 3/10 07/26/14   Time 6   Period Weeks   Status New   PT LONG TERM GOAL #5   Title Improve FOTO to </= 42% limitation 05/27/15   Time 6   Period Weeks   Status New               Plan - 04/17/15 1629    Clinical Impression Statement Continued pain and muscle guarding. Poor posture and alignment influence pain and dysfunction.    Pt will benefit from skilled therapeutic intervention in order to improve on the following deficits Postural dysfunction;Improper body mechanics;Pain;Decreased range of motion;Decreased mobility;Increased fascial restricitons;Decreased endurance;Decreased activity tolerance   Rehab Potential Good   PT Frequency 2x / week   PT Duration 6 weeks   PT Treatment/Interventions Patient/family education;ADLs/Self Care Home Management;Therapeutic exercise;Therapeutic activities;Manual techniques;Dry needling;Neuromuscular re-education;Iontophoresis 4mg /ml Dexamethasone;Cryotherapy;Electrical Stimulation;Moist Heat;Ultrasound   PT Next Visit Plan continue education re chronic muscular pain and dysfunction; breaking the pain cycle; gentle movement and postural correction to begin stretching. Further assess strength as indicated and necessary.   PT Home Exercise Plan postural correction; HEP   Consulted and Agree with Plan of Care Patient        Problem List Patient Active Problem List   Diagnosis Date Noted  . Lateral epicondylitis 04/13/2015  . Trapezius muscle spasm 04/03/2015  . Hives 07/31/2013  . Essential hypertension, benign 11/26/2010  . NEOPLASMS UNSPEC NATURE BONE SOFT TISSUE&SKIN 11/15/2010  . Diabetes type 2, controlled ([place]) 12/01/2009  . Migraine 12/01/2009  .  Headache 12/01/2009    Samantha Price PT, MPH 04/17/2015, 4:32 PM  [facility] [place], [place], [address] Phone: [phone]   Fax:  [phone]  Name: Samantha Price MRN: [id number] Date of Birth: 21-Feb-1961

## 2015-04-17 NOTE — Patient Instructions (Signed)
Standing - move body turning side to side with feet staying still allowing upper body to move and arms to swing freely at sides   Standing - bend forward and let arms dangle toward floor - shake shoulders and upper body allowing arms to move freely and head to relax forward    AROM: Neck Rotation   Lying on back Turn head slowly to look over one shoulder, then the other. Hold each position  5-10____ seconds. Repeat __5-10__ times per set. Do __4-5__ sessions per day.   Scapula Adduction With Pectoralis Stretch: Low - Standing   Shoulders at 45 hands even with shoulders, keeping weight through legs, shift weight forward until you feel pull or stretch through the front of your chest. Hold _30__ seconds. Do _3__ times, _3-4__ times per day.   Scapula Adduction With Pectoralis Stretch: Mid-Range - Standing   Shoulders at 90 elbows even with shoulders, keeping weight through legs, shift weight forward until you feel pull or strength through the front of your chest. Hold __30_ seconds. Do _3__ times, __3-4_ times per day.

## 2015-04-20 ENCOUNTER — Encounter: Payer: Self-pay | Admitting: Rehabilitative and Restorative Service Providers"

## 2015-04-20 ENCOUNTER — Ambulatory Visit (INDEPENDENT_AMBULATORY_CARE_PROVIDER_SITE_OTHER): Payer: 59 | Admitting: Rehabilitative and Restorative Service Providers"

## 2015-04-20 DIAGNOSIS — Z7409 Other reduced mobility: Secondary | ICD-10-CM | POA: Diagnosis not present

## 2015-04-20 DIAGNOSIS — M542 Cervicalgia: Secondary | ICD-10-CM | POA: Diagnosis not present

## 2015-04-20 DIAGNOSIS — M623 Immobility syndrome (paraplegic): Secondary | ICD-10-CM | POA: Diagnosis not present

## 2015-04-20 DIAGNOSIS — M79601 Pain in right arm: Secondary | ICD-10-CM

## 2015-04-20 DIAGNOSIS — M2560 Stiffness of unspecified joint, not elsewhere classified: Secondary | ICD-10-CM

## 2015-04-20 NOTE — Therapy (Signed)
[place], [place], [address] Phone: [phone]   Fax:  [phone]  Physical Therapy Treatment  Patient Details  Name: Samantha Price MRN: [id number] Date of Birth: Jun 30, 1960 Referring Provider: Dr. Lynne Leader  Encounter Date: 04/20/2015      PT End of Session - 04/20/15 1525    Visit Number 3   Number of Visits 18   Date for PT Re-Evaluation 05/27/15   PT Start Time 1518   PT Stop Time 1608   PT Time Calculation (min) 50 min   Activity Tolerance Patient limited by pain;Patient tolerated treatment well      Past Medical History  Diagnosis Date  . Diabetes mellitus     type 2  . Migraines   . Fibromyalgia   . Cyst     drained from Rt breast  . Herniated disc   . Wears glasses   . Anemia     Past Surgical History  Procedure Laterality Date  . Appendectomy    . Breast cyst excison      Left,   . Tubal ligation    . Colonoscopy w/ polypectomy      2007, Dr. Oletta Lamas.      There were no vitals filed for this visit.  Visit Diagnosis:  Cervical pain (neck)  Pain, arm, right  Stiffness due to immobility  Impaired functional mobility and endurance      Subjective Assessment - 04/20/15 1525    Subjective Felt a bit better after treatment last week. Had to do some household chores after she let PT and pain returned. Exercises hurt but she does them once a day. Has not had time to find a ball.    Currently in Pain? Yes   Pain Score 9    Pain Orientation Right   Pain Descriptors / Indicators Throbbing;Stabbing   Pain Type Acute pain   Pain Onset More than a month ago   Pain Frequency Constant            [facility] PT Assessment - 04/20/15 0001    AROM   Right Shoulder Flexion --  supine 137 deg    Right Shoulder External Rotation --  supine 62 deg at 90 deg abd                     [facility] Adult PT Treatment/Exercise - 04/20/15 0001    Exercises    Exercises --  UE movement standing for relaxation/movement   Neck Exercises: Standing   Neck Retraction 10 reps;10 secs   Other Standing Exercises chest lift/scap squeeze 10 sec hold 10 reps    Neck Exercises: Supine   Neck Retraction Limitations chin tuck 10 sec hold 5 reps    Lateral Flexion Limitations bilat ER arms at side using yellow TB 10 reps    Shoulder Flexion 10 reps;Both   Upper Extremity Flexion with Stabilization --  unable to tolerate d/t pain   Upper Extremity D1 --  unable to tolerate d/t pain   Other Supine Exercise nodding yes/no   Other Supine Exercise rotation Rt/Lt 5 x each side   Moist Heat Therapy   Number Minutes Moist Heat 15 Minutes   Moist Heat Location Cervical   Electrical Stimulation   Electrical Stimulation Location cervical area/shoulder/ lateral epicondyle   Electrical Stimulation Action IFC   Electrical Stimulation Parameters to tolerance   Electrical Stimulation Goals Pain;Other (comment)  tightness   Iontophoresis  Type of Iontophoresis Dexamethasone   Location Rt lateral epicondylitis   Dose 40 mAmp   Time extended wear   Manual Therapy   Joint Mobilization gentle c-spine   Soft tissue mobilization ant/lat/post cervical; pecs; upper trap; periscapular; extensor forearm   Myofascial Release chest; cervical area   Scapular Mobilization Rt   Manual Traction cervical                PT Education - 04/20/15 1732    Education provided Yes   Education Details education re frequency of HEP which is on written instructions; importance of working on posture and alignment; making ergonomic chnages to work space, home, Actor) Educated Patient   Methods Explanation   Comprehension Verbalized understanding             PT Long Term Goals - 04/20/15 1736    PT LONG TERM GOAL #1   Title Improve ROM/mobility through cervical spine and Rt shoulder/wrist 05/27/15   Time 6   Period Weeks   Status On-going   PT LONG TERM GOAL  #2   Title Decrease pain by 50% for 70-75% of the day, allowing patient to rest and work with greater ease and less difficulty 05/27/15   Time 6   Period Weeks   Status On-going   PT LONG TERM GOAL #3   Title Improve posture and alignment with patient demonstrating appropriate upright posture in standing and sitting 05/27/15   Time 6   Period Weeks   Status On-going   PT LONG TERM GOAL #4   Title Patient to tolerate sitting/standing/walking for 10-15 min with pain of 0/10 to 3/10 07/26/14   Time 6   Period Weeks   Status On-going   PT LONG TERM GOAL #5   Title Improve FOTO to </= 42% limitation 05/27/15   Time 6   Period Weeks   Status On-going               Plan - 04/20/15 1734    Clinical Impression Statement Pt continues to report pain at 9/10. She has muscular tightness to palpation and muscle guarding with movement. Pt needs to increase frequency of HEP and make modifications to environment. minimal progress toward stated goals of therapy.    Pt will benefit from skilled therapeutic intervention in order to improve on the following deficits Postural dysfunction;Improper body mechanics;Pain;Decreased range of motion;Decreased mobility;Increased fascial restricitons;Decreased endurance;Decreased activity tolerance   Rehab Potential Good   PT Frequency 2x / week   PT Duration 6 weeks   PT Treatment/Interventions Patient/family education;ADLs/Self Care Home Management;Therapeutic exercise;Therapeutic activities;Manual techniques;Dry needling;Neuromuscular re-education;Iontophoresis 4mg /ml Dexamethasone;Cryotherapy;Electrical Stimulation;Moist Heat;Ultrasound   PT Next Visit Plan continue education re chronic muscular pain and dysfunction; breaking the pain cycle; gentle movement and postural correction to begin stretching. Further assess strength as indicated and necessary.   PT Home Exercise Plan postural correction; HEP   Consulted and Agree with Plan of Care Patient         Problem List Patient Active Problem List   Diagnosis Date Noted  . Lateral epicondylitis 04/13/2015  . Trapezius muscle spasm 04/03/2015  . Hives 07/31/2013  . Essential hypertension, benign 11/26/2010  . NEOPLASMS UNSPEC NATURE BONE SOFT TISSUE&SKIN 11/15/2010  . Diabetes type 2, controlled ([place]) 12/01/2009  . Migraine 12/01/2009  . Headache 12/01/2009    Rishik Tubby Nilda Simmer PT, MPH 04/20/2015, 5:38 PM  [facility] [place], [place], [address] Phone: [phone]  Fax:  [phone]  Name: Samantha Price MRN: [id number] Date of Birth: January 27, 1961

## 2015-04-22 ENCOUNTER — Ambulatory Visit (INDEPENDENT_AMBULATORY_CARE_PROVIDER_SITE_OTHER): Payer: 59 | Admitting: Rehabilitative and Restorative Service Providers"

## 2015-04-22 ENCOUNTER — Encounter: Payer: Self-pay | Admitting: Rehabilitative and Restorative Service Providers"

## 2015-04-22 DIAGNOSIS — Z7409 Other reduced mobility: Secondary | ICD-10-CM | POA: Diagnosis not present

## 2015-04-22 DIAGNOSIS — M79601 Pain in right arm: Secondary | ICD-10-CM

## 2015-04-22 DIAGNOSIS — M542 Cervicalgia: Secondary | ICD-10-CM | POA: Diagnosis not present

## 2015-04-22 DIAGNOSIS — M623 Immobility syndrome (paraplegic): Secondary | ICD-10-CM | POA: Diagnosis not present

## 2015-04-22 DIAGNOSIS — M2560 Stiffness of unspecified joint, not elsewhere classified: Secondary | ICD-10-CM

## 2015-04-22 NOTE — Patient Instructions (Signed)
Resisted External Rotation: in Neutral - Bilateral   PALMS UP Sit or stand, tubing in both hands, elbows at sides, bent to 90, forearms forward. Pinch shoulder blades together and rotate forearms out. Keep elbows at sides. Repeat __10__ times per set. Do _2-3___ sets per session. Do _2-3___ sessions per day.   Low Row: Standing   Face anchor, feet shoulder width apart. Palms up, pull arms back, squeezing shoulder blades together. Repeat 10__ times per set. Do 2-3__ sets per session. Do 2-3__ sessions per week. Anchor Height: Waist   Strengthening: Resisted Extension   Hold tubing in right hand, arm forward. Pull arm back, elbow straight. Repeat _10___ times per set. Do 2-3____ sets per session. Do 2-3____ sessions per day.   On back - hips and knees bent; palms toward face, elbows bent at 90 degrees; pull yellow band out to side 2-3 times/day  On back - tuck chin down Hold 10 sec 10 reps  2-3 times/day  On back - press shoulders down into bed hold 5-10 sec 10 reps 2-3 times/day

## 2015-04-22 NOTE — Therapy (Signed)
[place], [place], [address] Phone: [phone]   Fax:  [phone]  Physical Therapy Evaluation  Patient Details  Name: Samantha Price MRN: [id number] Date of Birth: 1960/07/22 Referring Provider: Dr. Lynne Leader  Encounter Date: 04/22/2015      PT End of Session - 04/22/15 1514    Visit Number 4   Number of Visits 18   Date for PT Re-Evaluation 05/27/15   PT Start Time 6301   PT Stop Time 1617   PT Time Calculation (min) 62 min   Activity Tolerance Patient tolerated treatment well;Patient limited by pain      Past Medical History  Diagnosis Date  . Diabetes mellitus     type 2  . Migraines   . Fibromyalgia   . Cyst     drained from Rt breast  . Herniated disc   . Wears glasses   . Anemia     Past Surgical History  Procedure Laterality Date  . Appendectomy    . Breast cyst excison      Left,   . Tubal ligation    . Colonoscopy w/ polypectomy      2007, Dr. Oletta Lamas.      There were no vitals filed for this visit.  Visit Diagnosis:  Cervical pain (neck)  Pain, arm, right  Stiffness due to immobility  Impaired functional mobility and endurance      Subjective Assessment - 04/22/15 1515    Subjective Still painful. Doing her exercises at home and it seems to make a difference. Ordered balls and a noodle which should be here tomorrow.    Currently in Pain? Yes   Pain Score 8    Pain Location Shoulder   Pain Orientation Right   Pain Descriptors / Indicators Throbbing;Stabbing   Pain Type Acute pain   Pain Onset More than a month ago   Pain Frequency Constant            [facility] PT Assessment - 04/22/15 0001    Assessment   Medical Diagnosis Rt trapezius spasms; lateral epidondylitis   Onset Date/Surgical Date 11/10/14   Hand Dominance Right   Next MD Visit 04/27/15   Prior Therapy none for current problems    AROM   Cervical - Right Side Bend 30  pain    Cervical - Left Side Bend 35  pain                   [facility] Adult PT Treatment/Exercise - 04/22/15 0001    Neuro Re-ed    Neuro Re-ed Details  working on improved posture and alignment   Neck Exercises: Machines for Strengthening   UBE (Upper Arm Bike) L1 1.3 min fwd; 1.5 back   Neck Exercises: Standing   Neck Retraction 10 reps;10 secs   Other Standing Exercises chest lift/scap squeeze 10 sec hold 10 reps    Neck Exercises: Supine   Neck Retraction Limitations chin tuck 10 sec hold 5 reps    Lateral Flexion Limitations bilat ER arms at side using yellow TB 10 reps    Shoulder Flexion 10 reps;Both   Shoulder Flexion Limitations thoracic lift 2-3 sec hold 10 reps    Other Supine Exercise nodding yes/no   Other Supine Exercise rotation Rt/Lt 5 x each side   Moist Heat Therapy   Number Minutes Moist Heat 15 Minutes   Moist Heat Location Cervical   Electrical Stimulation   Electrical Stimulation Location  cervical area/shoulder/ lateral epicondyle   Electrical Stimulation Action IFC   Electrical Stimulation Parameters to tolerance   Electrical Stimulation Goals Pain;Other (comment)  tightness   Iontophoresis   Location Rt lateral epicondylitis   Dose 40 mAmp   Time extended wear   Manual Therapy   Joint Mobilization gentle c-spine   Soft tissue mobilization ant/lat/post cervical; pecs; upper trap; periscapular; extensor forearm   Myofascial Release chest; cervical area   Scapular Mobilization Rt   Manual Traction cervical   Neck Exercises: Stretches   Neck Stretch --  sitting w/ chest lift lat cervical flex 20 sec/3 reps bilat   Corner Stretch --  doorway 2 lower positions 20 sec hold 3 reps each                PT Education - 04/22/15 1541    Education provided Yes   Education Details cont education re exercises as instructed - not 10 reps - but to hold the stretch as directed on HEP instructions; cont education re - musculoskeletal dysfunction; HEP    Person(s) Educated Patient   Methods Explanation;Demonstration;Tactile cues;Verbal cues;Handout   Comprehension Verbalized understanding;Returned demonstration;Verbal cues required;Tactile cues required             PT Long Term Goals - 04/20/15 1736    PT LONG TERM GOAL #1   Title Improve ROM/mobility through cervical spine and Rt shoulder/wrist 05/27/15   Time 6   Period Weeks   Status On-going   PT LONG TERM GOAL #2   Title Decrease pain by 50% for 70-75% of the day, allowing patient to rest and work with greater ease and less difficulty 05/27/15   Time 6   Period Weeks   Status On-going   PT LONG TERM GOAL #3   Title Improve posture and alignment with patient demonstrating appropriate upright posture in standing and sitting 05/27/15   Time 6   Period Weeks   Status On-going   PT LONG TERM GOAL #4   Title Patient to tolerate sitting/standing/walking for 10-15 min with pain of 0/10 to 3/10 07/26/14   Time 6   Period Weeks   Status On-going   PT LONG TERM GOAL #5   Title Improve FOTO to </= 42% limitation 05/27/15   Time 6   Period Weeks   Status On-going               Plan - 04/22/15 1607    Clinical Impression Statement Some improvement in cervical lateraal flexion; tolerating exercise better with improved technique; improved tolerance for manual work. Some progress reported and noted.    Pt will benefit from skilled therapeutic intervention in order to improve on the following deficits Postural dysfunction;Improper body mechanics;Pain;Decreased range of motion;Decreased mobility;Increased fascial restricitons;Decreased endurance;Decreased activity tolerance   Rehab Potential Good   PT Frequency 2x / week   PT Duration 6 weeks   PT Treatment/Interventions Patient/family education;ADLs/Self Care Home Management;Therapeutic exercise;Therapeutic activities;Manual techniques;Dry needling;Neuromuscular re-education;Iontophoresis 4mg /ml  Dexamethasone;Cryotherapy;Electrical Stimulation;Moist Heat;Ultrasound   PT Next Visit Plan continue education re chronic muscular pain and dysfunction; breaking the pain cycle; gentle movement and postural correction to begin stretching. Further assess strength as indicated and necessary.   PT Home Exercise Plan postural correction; HEP   Consulted and Agree with Plan of Care Patient         Problem List Patient Active Problem List   Diagnosis Date Noted  . Lateral epicondylitis 04/13/2015  . Trapezius muscle spasm 04/03/2015  . Hives 07/31/2013  .  Essential hypertension, benign 11/26/2010  . NEOPLASMS UNSPEC NATURE BONE SOFT TISSUE&SKIN 11/15/2010  . Diabetes type 2, controlled ([facility]) 12/01/2009  . Migraine 12/01/2009  . Headache 12/01/2009    Ikenna Ohms Nilda Simmer PT, MPH 04/22/2015, 4:10 PM  [facility] [place], [place], [address] Phone: [phone]   Fax:  [phone]  Name: Samantha Price MRN: [id number] Date of Birth: 07/29/1960

## 2015-04-24 ENCOUNTER — Ambulatory Visit (INDEPENDENT_AMBULATORY_CARE_PROVIDER_SITE_OTHER): Payer: 59 | Admitting: Physical Therapy

## 2015-04-24 DIAGNOSIS — M79601 Pain in right arm: Secondary | ICD-10-CM | POA: Diagnosis not present

## 2015-04-24 DIAGNOSIS — Z7409 Other reduced mobility: Secondary | ICD-10-CM

## 2015-04-24 DIAGNOSIS — M2560 Stiffness of unspecified joint, not elsewhere classified: Secondary | ICD-10-CM

## 2015-04-24 DIAGNOSIS — M542 Cervicalgia: Secondary | ICD-10-CM

## 2015-04-24 DIAGNOSIS — M623 Immobility syndrome (paraplegic): Secondary | ICD-10-CM | POA: Diagnosis not present

## 2015-04-24 NOTE — Therapy (Signed)
[place], [place], [address] Phone: [phone]   Fax:  [phone]  Physical Therapy Treatment  Patient Details  Name: Samantha Price MRN: [id number] Date of Birth: Feb 11, 1961 Referring Provider: Dr. Lynne Leader  Encounter Date: 04/24/2015      PT End of Session - 04/24/15 1639    Visit Number 5   Number of Visits 18   Date for PT Re-Evaluation 05/27/15   PT Start Time 1540   PT Stop Time 1625   PT Time Calculation (min) 45 min   Activity Tolerance Patient limited by pain      Past Medical History  Diagnosis Date  . Diabetes mellitus     type 2  . Migraines   . Fibromyalgia   . Cyst     drained from Rt breast  . Herniated disc   . Wears glasses   . Anemia     Past Surgical History  Procedure Laterality Date  . Appendectomy    . Breast cyst excison      Left,   . Tubal ligation    . Colonoscopy w/ polypectomy      2007, Dr. Oletta Lamas.      There were no vitals filed for this visit.  Visit Diagnosis:  No diagnosis found.          [facility] PT Assessment - 04/24/15 0001    Assessment   Medical Diagnosis Rt trapezius spasms; lateral epidondylitis   Onset Date/Surgical Date 11/10/14   Hand Dominance Right   Next MD Visit --   Prior Therapy none for current problems           [facility] Adult PT Treatment/Exercise - 04/24/15 0001    Exercises   Exercises Shoulder   Neck Exercises: Machines for Strengthening   UBE (Upper Arm Bike) L1: 2 min backward   Shoulder Exercises: Supine   Other Supine Exercises Hooklying on 1/2 foam roll with arms horiz abd to 90 deg. Pt unable to tolerate- moved to hooklying on black mat table.  Repeated horiz abduction stretch of BUE.  Neural glides for RUE with wrist flex / ext x 15 reps (tingling subsided).   Shoulder Exercises: Stretch   Other Shoulder Stretches Doorway stretch  mid level x 20 sec x 2 reps.    Moist Heat Therapy   Number  Minutes Moist Heat 10 Minutes   Moist Heat Location Cervical  upper thoracic and Rt shoulder   Electrical Stimulation   Electrical Stimulation Location Rt cspine paraspinals/ levator attachment/ deltoid/ infraspinatus (Rt)    Electrical Stimulation Action IFC   Electrical Stimulation Parameters to tolerance    Electrical Stimulation Goals Pain   Manual Therapy   Myofascial Release Rt pec, Rt levator, Rt rhomboid with very light pressure with oscillations of RUE to ease guarding           PT Long Term Goals - 04/20/15 1736    PT LONG TERM GOAL #1   Title Improve ROM/mobility through cervical spine and Rt shoulder/wrist 05/27/15   Time 6   Period Weeks   Status On-going   PT LONG TERM GOAL #2   Title Decrease pain by 50% for 70-75% of the day, allowing patient to rest and work with greater ease and less difficulty 05/27/15   Time 6   Period Weeks   Status On-going   PT LONG TERM GOAL #3   Title Improve posture and alignment with patient demonstrating appropriate upright  posture in standing and sitting 05/27/15   Time 6   Period Weeks   Status On-going   PT LONG TERM GOAL #4   Title Patient to tolerate sitting/standing/walking for 10-15 min with pain of 0/10 to 3/10 07/26/14   Time 6   Period Weeks   Status On-going   PT LONG TERM GOAL #5   Title Improve FOTO to </= 42% limitation 05/27/15   Time 6   Period Weeks   Status On-going               Plan - 04/24/15 1640    Clinical Impression Statement Pt very sensitive to light pressure to areas of restriction in RUE/ shoulder girdle; with increased time pt able to allow increased pressure.  Pt reported some "pins and needles" initially upon beginning supine position with RUE abd to 90 deg; this was eliminated by neural glides with hand off of table.  Pt reported decreased pain by 4 points by end of session.  Lengthy discussion with pt regarding pain spasm cycle and strategies to break cycle; pt receptive to trying  stretches and heat to assist with this.  Slowly progressing towards stated goals.    Pt will benefit from skilled therapeutic intervention in order to improve on the following deficits Postural dysfunction;Improper body mechanics;Pain;Decreased range of motion;Decreased mobility;Increased fascial restricitons;Decreased endurance;Decreased activity tolerance   Rehab Potential Good   PT Frequency 2x / week   PT Duration 6 weeks   PT Treatment/Interventions Patient/family education;ADLs/Self Care Home Management;Therapeutic exercise;Therapeutic activities;Manual techniques;Dry needling;Neuromuscular re-education;Iontophoresis 4mg /ml Dexamethasone;Cryotherapy;Electrical Stimulation;Moist Heat;Ultrasound   PT Next Visit Plan Continue postural education, gentle ROM with LUE, manual therapy to shoulder girdle.  Assess strength if tolerated.     Consulted and Agree with Plan of Care Patient        Problem List Patient Active Problem List   Diagnosis Date Noted  . Lateral epicondylitis 04/13/2015  . Trapezius muscle spasm 04/03/2015  . Hives 07/31/2013  . Essential hypertension, benign 11/26/2010  . NEOPLASMS UNSPEC NATURE BONE SOFT TISSUE&SKIN 11/15/2010  . Diabetes type 2, controlled ([facility]) 12/01/2009  . Migraine 12/01/2009  . Headache 12/01/2009   Kerin Perna, PTA 04/24/2015 4:51 PM  [place], [place], [address] Phone: [phone]   Fax:  [phone]  Name: Samantha Price MRN: [id number] Date of Birth: [birth year]

## 2015-04-27 ENCOUNTER — Encounter: Payer: Self-pay | Admitting: Rehabilitative and Restorative Service Providers"

## 2015-04-27 ENCOUNTER — Ambulatory Visit (INDEPENDENT_AMBULATORY_CARE_PROVIDER_SITE_OTHER): Payer: 59 | Admitting: Rehabilitative and Restorative Service Providers"

## 2015-04-27 DIAGNOSIS — Z7409 Other reduced mobility: Secondary | ICD-10-CM | POA: Diagnosis not present

## 2015-04-27 DIAGNOSIS — M79601 Pain in right arm: Secondary | ICD-10-CM

## 2015-04-27 DIAGNOSIS — M542 Cervicalgia: Secondary | ICD-10-CM | POA: Diagnosis not present

## 2015-04-27 DIAGNOSIS — M2560 Stiffness of unspecified joint, not elsewhere classified: Secondary | ICD-10-CM

## 2015-04-27 DIAGNOSIS — M623 Immobility syndrome (paraplegic): Secondary | ICD-10-CM | POA: Diagnosis not present

## 2015-04-27 NOTE — Therapy (Signed)
[place], [place], [address] Phone: [phone]   Fax:  [phone]  Physical Therapy Treatment  Patient Details  Name: Samantha Price MRN: [id number] Date of Birth: 10/10/1960 Referring Provider: Dr. Lynne Leader  Encounter Date: 04/27/2015      PT End of Session - 04/27/15 1517    Visit Number 6   Number of Visits 18   Date for PT Re-Evaluation 05/27/15   PT Start Time 6761   PT Stop Time 1620   PT Time Calculation (min) 63 min   Activity Tolerance Patient limited by pain;Patient tolerated treatment well  improving tolerance for manual work and exercises      Past Medical History  Diagnosis Date  . Diabetes mellitus     type 2  . Migraines   . Fibromyalgia   . Cyst     drained from Rt breast  . Herniated disc   . Wears glasses   . Anemia     Past Surgical History  Procedure Laterality Date  . Appendectomy    . Breast cyst excison      Left,   . Tubal ligation    . Colonoscopy w/ polypectomy      2007, Dr. Oletta Lamas.      There were no vitals filed for this visit.  Visit Diagnosis:  Cervical pain (neck)  Pain, arm, right  Stiffness due to immobility  Impaired functional mobility and endurance      Subjective Assessment - 04/27/15 1517    Subjective Thinks she is making progress. The pain now is more tolerable. Periods of time when she is lying down when she did not notice the pain. Stopped a couple of her dogs from fightning used her Rt arm and that really hurt.    Currently in Pain? Yes   Pain Score 8    Pain Location Shoulder   Pain Orientation Right   Pain Descriptors / Indicators Throbbing;Stabbing   Pain Type Acute pain   Pain Onset More than a month ago   Pain Frequency Intermittent   Aggravating Factors  movement; use of Rt UE    Pain Relieving Factors lying on back             [facility] PT Assessment - 04/27/15 0001    Assessment   Medical  Diagnosis Rt trapezius spasms; lateral epidondylitis   Onset Date/Surgical Date 11/10/14   Hand Dominance Right   AROM   Right Shoulder Flexion 125 Degrees  supine   Right Shoulder ABduction 110 Degrees  pain   Right Shoulder External Rotation 72 Degrees   Cervical - Right Side Bend 32  pain/discomfort   Cervical - Left Side Bend 36  pain/discomfort   Palpation   Palpation comment improved tenderness through Rt upper quadrant - ant/lat/post cervical musculature; upper trap; leveator; teres; rhomboids; pecs; extensor forearm                     [facility] Adult PT Treatment/Exercise - 04/27/15 0001    Therapeutic Activites    Therapeutic Activities --  myofacial ball release work    Neuro Re-ed    Neuro Re-ed Details  posture and alignment   Exercises   Exercises Shoulder   Neck Exercises: Machines for Strengthening   UBE (Upper Arm Bike) L1 4 min total back 2 min/fwd 1.5 min/back .5 min   Neck Exercises: Standing   Neck Retraction 10 reps;10 secs   Other Standing  Exercises chest lift/scap squeeze 10 sec hold 10 reps    Neck Exercises: Supine   Other Supine Exercise nodding yes/no   Other Supine Exercise rotation Rt/Lt 5 x each side   Shoulder Exercises: Supine   Flexion --  yellow TB resistance btn hands for shd flexion to pt tol    Other Supine Exercises supine with horizontal abduction for proloned stretch; neural glide with wrist flexion/extension ~10-15 reps   Other Supine Exercises digonal yellow TB in supine x 10    Shoulder Exercises: Standing   Extension Strengthening;Both;10 reps;Theraband   Theraband Level (Shoulder Extension) Level 1 (Yellow)   Row Strengthening;Both;10 reps;Theraband   Theraband Level (Shoulder Row) Level 1 (Yellow)   Retraction Strengthening;Both;10 reps;Theraband  with noodle   Theraband Level (Shoulder Retraction) Level 1 (Yellow)   Shoulder Exercises: Stretch   Other Shoulder Stretches Doorway stretch  mid level x 20 sec x 2  reps.    Moist Heat Therapy   Number Minutes Moist Heat 15 Minutes   Moist Heat Location Cervical  upper thoracic and Lt shoulder   Electrical Stimulation   Electrical Stimulation Location Rt cspine paraspinals/ levator attachment/ deltoid/ infraspinatus (Rt)    Electrical Stimulation Action IFC   Electrical Stimulation Parameters to tolerance   Electrical Stimulation Goals Pain   Manual Therapy   Joint Mobilization gentle c-spine   Soft tissue mobilization ant/lat/post cervical; pecs; upper trap; periscapular; extensor forearm   Myofascial Release Rt pec, Rt levator, Rt rhomboid    Scapular Mobilization Rt   Manual Traction cervical                PT Education - 04/27/15 1615    Education provided Yes   Education Details encouraged consistent HEP and work on posture/alignment/ball release work   Forensic psychologist) Educated Patient   Methods Explanation   Comprehension Verbalized understanding             PT Long Term Goals - 04/27/15 1635    PT LONG TERM GOAL #1   Title Improve ROM/mobility through cervical spine and Rt shoulder/wrist 05/27/15   Time 6   Period Weeks   Status On-going   PT LONG TERM GOAL #2   Title Decrease pain by 50% for 70-75% of the day, allowing patient to rest and work with greater ease and less difficulty 05/27/15   Time 6   Period Weeks   Status On-going   PT LONG TERM GOAL #3   Title Improve posture and alignment with patient demonstrating appropriate upright posture in standing and sitting 05/27/15   Time 6   Period Weeks   Status On-going   PT LONG TERM GOAL #4   Title Patient to tolerate sitting/standing/walking for 10-15 min with pain of 0/10 to 3/10 07/26/14   Time 6   Period Weeks   Status On-going   PT LONG TERM GOAL #5   Title Improve FOTO to </= 42% limitation 05/27/15   Time 6   Period Weeks   Status On-going               Plan - 04/27/15 1625    Clinical Impression Statement Improving posture and alignment; less  tenderness and sensitivity to touch/palpation; some decrease in pain and improvement in mobility. Progressing gradually toward stated goals of therapy - none accomplished.    Pt will benefit from skilled therapeutic intervention in order to improve on the following deficits Postural dysfunction;Improper body mechanics;Pain;Decreased range of motion;Decreased mobility;Increased fascial restricitons;Decreased endurance;Decreased activity tolerance  Rehab Potential Good   PT Frequency 2x / week   PT Duration 6 weeks   PT Treatment/Interventions Patient/family education;ADLs/Self Care Home Management;Therapeutic exercise;Therapeutic activities;Manual techniques;Dry needling;Neuromuscular re-education;Iontophoresis 4mg /ml Dexamethasone;Cryotherapy;Electrical Stimulation;Moist Heat;Ultrasound   PT Next Visit Plan Continue postural education, gentle ROM with LUE, manual therapy to shoulder girdle.  Assess strength as tolerated.     PT Home Exercise Plan postural correction; HEP   Consulted and Agree with Plan of Care Patient        Problem List Patient Active Problem List   Diagnosis Date Noted  . Lateral epicondylitis 04/13/2015  . Trapezius muscle spasm 04/03/2015  . Hives 07/31/2013  . Essential hypertension, benign 11/26/2010  . NEOPLASMS UNSPEC NATURE BONE SOFT TISSUE&SKIN 11/15/2010  . Diabetes type 2, controlled ([place]) 12/01/2009  . Migraine 12/01/2009  . Headache 12/01/2009    Gidget Quizhpi Nilda Simmer PT, MPH 04/27/2015, 4:36 PM  [facility] [place], [place], [address] Phone: [phone]   Fax:  [phone]  Name: BETHAN ADAMEK MRN: [id number] Date of Birth: [birth year]

## 2015-04-29 ENCOUNTER — Ambulatory Visit (INDEPENDENT_AMBULATORY_CARE_PROVIDER_SITE_OTHER): Payer: 59 | Admitting: Rehabilitative and Restorative Service Providers"

## 2015-04-29 ENCOUNTER — Encounter: Payer: Self-pay | Admitting: Rehabilitative and Restorative Service Providers"

## 2015-04-29 DIAGNOSIS — M623 Immobility syndrome (paraplegic): Secondary | ICD-10-CM | POA: Diagnosis not present

## 2015-04-29 DIAGNOSIS — M2560 Stiffness of unspecified joint, not elsewhere classified: Secondary | ICD-10-CM

## 2015-04-29 DIAGNOSIS — M542 Cervicalgia: Secondary | ICD-10-CM

## 2015-04-29 DIAGNOSIS — Z7409 Other reduced mobility: Secondary | ICD-10-CM | POA: Diagnosis not present

## 2015-04-29 DIAGNOSIS — M79601 Pain in right arm: Secondary | ICD-10-CM

## 2015-04-29 NOTE — Therapy (Addendum)
[facility] [place], [place], [address] Phone: [phone]   Fax:  [phone]  Physical Therapy Treatment  Patient Details  Name: Samantha Price MRN: [id number] Date of Birth: 1960-09-03 Referring Provider: Dr. Lynne Leader  Encounter Date: 04/29/2015      PT End of Session - 04/29/15 1527    Visit Number 7   Number of Visits 18   Date for PT Re-Evaluation 05/27/15   PT Start Time 1522   PT Stop Time 7169   PT Time Calculation (min) 53 min   Activity Tolerance Patient limited by pain;Patient tolerated treatment well      Past Medical History  Diagnosis Date  . Diabetes mellitus     type 2  . Migraines   . Fibromyalgia   . Cyst     drained from Rt breast  . Herniated disc   . Wears glasses   . Anemia     Past Surgical History  Procedure Laterality Date  . Appendectomy    . Breast cyst excison      Left,   . Tubal ligation    . Colonoscopy w/ polypectomy      2007, Dr. Oletta Lamas.      There were no vitals filed for this visit.  Visit Diagnosis:  Cervical pain (neck)  Pain, arm, right  Stiffness due to immobility  Impaired functional mobility and endurance      Subjective Assessment - 04/29/15 1527    Subjective Stressed today - just generally stressed more. Not a good day.    Currently in Pain? Yes   Pain Score 7    Pain Location Shoulder   Pain Orientation Right   Pain Descriptors / Indicators Throbbing;Stabbing   Pain Type Acute pain   Pain Onset More than a month ago   Pain Frequency Intermittent                         [facility] Adult PT Treatment/Exercise - 04/29/15 0001    Therapeutic Activites    Therapeutic Activities --  myofacial ball release work    Neuro Re-ed    Neuro Re-ed Details  posture and alignment   Exercises   Exercises Shoulder   Neck Exercises: Machines for Strengthening   UBE (Upper Arm Bike) L1 4 min total back 2 min/fwd 1.5  min/back .5 min   Neck Exercises: Standing   Neck Retraction 10 reps;10 secs   Other Standing Exercises chest lift/scap squeeze 10 sec hold 10 reps    Neck Exercises: Supine   Neck Retraction Limitations chin tuck 10 sec hold 5 reps    Other Supine Exercise nodding yes/no   Other Supine Exercise rotation Rt/Lt 5 x each side   Shoulder Exercises: Supine   Flexion --  yellow TB resistance btn hands for shd flexion to pt tol    Other Supine Exercises supine with horizontal abduction for proloned stretch; neural glide with wrist flexion/extension ~10-15 reps   Other Supine Exercises digonal yellow TB in supine x 10    Shoulder Exercises: Standing   Extension Strengthening;Both;10 reps;Theraband   Theraband Level (Shoulder Extension) Level 1 (Yellow)   Row Strengthening;Both;10 reps;Theraband   Theraband Level (Shoulder Row) Level 1 (Yellow)   Retraction Strengthening;Both;10 reps;Theraband  with noodle   Theraband Level (Shoulder Retraction) Level 1 (Yellow)   Shoulder Exercises: Stretch   Other Shoulder Stretches Doorway stretch  mid level x 20 sec x 2  reps.    Moist Heat Therapy   Number Minutes Moist Heat 15 Minutes   Moist Heat Location Cervical  upper thoracic and Lt shoulder   Electrical Stimulation   Electrical Stimulation Location Rt cspine paraspinals/ levator attachment/ deltoid/ infraspinatus (Rt)    Electrical Stimulation Action IFC   Electrical Stimulation Parameters to tolerance   Electrical Stimulation Goals Pain   Manual Therapy   Joint Mobilization cervical   Soft tissue mobilization ant/lat/post cervical; pecs; upper trap; periscapular; extensor forearm   Myofascial Release Rt pec, Rt levator, Rt rhomboid    Scapular Mobilization Rt   Passive ROM cervical stretching to tolerance   Manual Traction cervical                     PT Long Term Goals - 04/27/15 1635    PT LONG TERM GOAL #1   Title Improve ROM/mobility through cervical spine and Rt  shoulder/wrist 05/27/15   Time 6   Period Weeks   Status On-going   PT LONG TERM GOAL #2   Title Decrease pain by 50% for 70-75% of the day, allowing patient to rest and work with greater ease and less difficulty 05/27/15   Time 6   Period Weeks   Status On-going   PT LONG TERM GOAL #3   Title Improve posture and alignment with patient demonstrating appropriate upright posture in standing and sitting 05/27/15   Time 6   Period Weeks   Status On-going   PT LONG TERM GOAL #4   Title Patient to tolerate sitting/standing/walking for 10-15 min with pain of 0/10 to 3/10 07/26/14   Time 6   Period Weeks   Status On-going   PT LONG TERM GOAL #5   Title Improve FOTO to </= 42% limitation 05/27/15   Time 6   Period Weeks   Status On-going               Plan - 04/29/15 1704    Clinical Impression Statement Pt states that she has increased symptoms today. she is stressed because tomorrow is her late husband's birthday. She did tolerate increased pressure with manual work and did well with passive stretch to tolerance.    Pt will benefit from skilled therapeutic intervention in order to improve on the following deficits Postural dysfunction;Improper body mechanics;Pain;Decreased range of motion;Decreased mobility;Increased fascial restricitons;Decreased endurance;Decreased activity tolerance   Rehab Potential Good   PT Frequency 2x / week   PT Duration 6 weeks   PT Treatment/Interventions Patient/family education;ADLs/Self Care Home Management;Therapeutic exercise;Therapeutic activities;Manual techniques;Dry needling;Neuromuscular re-education;Iontophoresis 62m/ml Dexamethasone;Cryotherapy;Electrical Stimulation;Moist Heat;Ultrasound   PT Next Visit Plan Continue postural education, gentle ROM with LUE, manual therapy to shoulder girdle.  Assess strength as tolerated.     PT Home Exercise Plan postural correction; HEP   Consulted and Agree with Plan of Care Patient        Problem  List Patient Active Problem List   Diagnosis Date Noted  . Lateral epicondylitis 04/13/2015  . Trapezius muscle spasm 04/03/2015  . Hives 07/31/2013  . Essential hypertension, benign 11/26/2010  . NEOPLASMS UNSPEC NATURE BONE SOFT TISSUE&SKIN 11/15/2010  . [place] 12/01/2009  . Migraine 12/01/2009  . Headache 12/01/2009    Celyn PNilda SimmerPT, MPH 04/29/2015, 5:07 PM  [facility] [address]Phone: [phone]  Fax:  [phone] Name: PISBELLA ARLINEMRN: [id number] of Birth: 118-May-1962  PHYSICAL THERAPY DISCHARGE SUMMARY  Visits from Start of Care: 7  Current functional level related to goals / functional outcomes: Improving posture and alignment; increased cervical mobility; decreasing pain and improving function.    Remaining deficits: Poor posture; limited mobility; myofacial restrictions   Education / Equipment: HEP; theraband  Plan: Patient agrees to discharge.  Patient goals were partially met. Patient is being discharged due to not returning since the last visit.  ?????     Celyn P. Helene Kelp PT, MPH 05/29/2015 1:53 PM

## 2015-05-01 ENCOUNTER — Encounter: Payer: 59 | Admitting: Rehabilitative and Restorative Service Providers"

## 2015-05-01 ENCOUNTER — Ambulatory Visit (INDEPENDENT_AMBULATORY_CARE_PROVIDER_SITE_OTHER): Payer: 59 | Admitting: Family Medicine

## 2015-05-01 ENCOUNTER — Encounter: Payer: Self-pay | Admitting: Family Medicine

## 2015-05-01 VITALS — BP 126/71 | HR 66 | Wt 169.0 lb

## 2015-05-01 DIAGNOSIS — M7550 Bursitis of unspecified shoulder: Secondary | ICD-10-CM | POA: Insufficient documentation

## 2015-05-01 DIAGNOSIS — M542 Cervicalgia: Secondary | ICD-10-CM | POA: Insufficient documentation

## 2015-05-01 DIAGNOSIS — M7551 Bursitis of right shoulder: Secondary | ICD-10-CM

## 2015-05-01 DIAGNOSIS — M6248 Contracture of muscle, other site: Secondary | ICD-10-CM

## 2015-05-01 DIAGNOSIS — M62838 Other muscle spasm: Secondary | ICD-10-CM

## 2015-05-01 NOTE — Assessment & Plan Note (Signed)
Continued despite one injection and physical therapy. Trial of one more injection. If not better with that and further physical therapy will proceed with MRI

## 2015-05-01 NOTE — Progress Notes (Signed)
Samantha Price is a 54 y.o. female who presents to [facility]  today for follow-up neck and shoulder pain.  She was seen about a month ago where she was thought to have right shoulder subacromial bursitis versus rotator cuff tendinitis and coexisting right trapezius spasm. The working diagnosis at that time was the trapezius spasm was secondary to abnormal scapular motion because of the rotator cuff tendinitis or subacromial bursitis. She received a subacromial injection which only helped a little and dedicated physical therapy. X-rays at the time of the first visit in early October showed degenerative changes in the C-spine, and a normal right shoulder x-ray. In the interim she's had multiple visits with physical therapy. She notes the pain she had in her trapezius has improved significantly. However she notes pain in the lateral upper arm this worse with shoulder motion that has not improved. Additionally she has pain to the right lower cervical paraspinals and right periscapular area that has not improved with physical therapy. The periscapular and pericervical pain is worse with neck motion. She denies any pain radiating to her hand weakness or numbness fevers or chills.   Past Medical History  Diagnosis Date  . Diabetes mellitus     type 2  . Migraines   . Fibromyalgia   . Cyst     drained from Rt breast  . Herniated disc   . Wears glasses   . Anemia    Past Surgical History  Procedure Laterality Date  . Appendectomy    . Breast cyst excison      Left,   . Tubal ligation    . Colonoscopy w/ polypectomy      2007, Dr. Oletta Lamas.     Social History  Substance Use Topics  . Smoking status: Never Smoker   . Smokeless tobacco: Not on file  . Alcohol Use: Yes   family history includes Breast cancer in an other family member; Cancer in her father; Colon cancer in her other; Diabetes in her mother; Heart disease in her other; Leukemia in an other  family member; Stroke in an other family member.  ROS as above Medications: Current Outpatient Prescriptions  Medication Sig Dispense Refill  . AMBULATORY NON FORMULARY MEDICATION Medication Name: Glucometer with strips and lancets to test 2-3 time per week. 1 Units PRN  . aspirin 81 MG tablet Take 81 mg by mouth daily.      . betamethasone dipropionate (DIPROLENE) 0.05 % cream Apply topically 2 (two) times daily. To affected area right leg 45 g 0  . Ginkgo Biloba 40 MG TABS Take 40 mg by mouth daily.      . Linagliptin-Metformin HCl (JENTADUETO) 2.10-998 MG TABS Take 1 tablet by mouth 2 (two) times daily. 60 tablet 6  . lisinopril-hydrochlorothiazide (ZESTORETIC) 20-12.5 MG per tablet Take 1 tablet by mouth 2 (two) times daily. 60 tablet 6  . methocarbamol (ROBAXIN) 500 MG tablet Take 1 tablet (500 mg total) by mouth 3 (three) times daily. 90 tablet 0  . Multiple Vitamin (MULTIVITAMIN PO) Take by mouth daily.      Marland Kitchen topiramate (TOPAMAX) 50 MG tablet Take 1 tablet (50 mg total) by mouth 2 (two) times daily. 60 tablet 5  . traMADol (ULTRAM) 50 MG tablet Take 1 tablet (50 mg total) by mouth every 8 (eight) hours as needed. 30 tablet 0   No current facility-administered medications for this visit.   No Known Allergies   Exam:  BP 126/71 mmHg  Pulse 66  Wt 169 lb (76.658 kg)  LMP 06/28/2011 Gen: Well NAD HEENT: EOMI,  MMM Lungs: Normal work of breathing. CTABL Heart: RRR no MRG Abd: NABS, Soft. Nondistended, Nontender Exts: Brisk capillary refill, warm and well perfused.  Neck: Nontender to spinal midline. Tender palpation right lower cervical paraspinals and right periscapular muscles. Minimally tender to the right trapezius. Normal neck range of motion pain present with motion. Shoulder exam is nontender to touch with abnormal abduction range of motion. Her active right shoulder abduction is limited to about 120 active but full passively. She has positive Hawkins and Neer's tests  as well. Her strength is intact throughout. Her external and internal range of motion is preserved Pulses capillary refill sensation and grip strength are preserved bilaterally and equal and normal  Procedure: Real-time Ultrasound Guided Injection of Right subacromial bursa  Device: GE Logiq E  Images permanently stored and available for review in the ultrasound unit. Verbal informed consent obtained. Discussed risks and benefits of procedure. Warned about infection bleeding damage to structures skin hypopigmentation and fat atrophy among others. Patient expresses understanding and agreement Time-out conducted.  Noted no overlying erythema, induration, or other signs of local infection.  Skin prepped in a sterile fashion.  Local anesthesia: Topical Ethyl chloride.  With sterile technique and under real time ultrasound guidance: 40 mg of Kenalog and 4 mL of Marcaine injected easily.  Completed without difficulty  Pain immediately resolved suggesting accurate placement of the medication.  Advised to call if fevers/chills, erythema, induration, drainage, or persistent bleeding.  Images permanently stored and available for review in the ultrasound unit.  Impression: Technically successful ultrasound guided injection.    No results found for this or any previous visit (from the past 24 hour(s)). No results found.   Please see individual assessment and plan sections.

## 2015-05-01 NOTE — Patient Instructions (Signed)
Thank you for coming in today. Come back or go to the emergency room if you notice new weakness new numbness problems walking or bowel or bladder problems. Call or go to the ER if you develop a large red swollen joint with extreme pain or oozing puss.  Return following MRI of the neck to talk about the MRI and go over plan.  Continue physical therapy.   Impingement Syndrome, Rotator Cuff, Bursitis With Rehab Impingement syndrome is a condition that involves inflammation of the tendons of the rotator cuff and the subacromial bursa, that causes pain in the shoulder. The rotator cuff consists of four tendons and muscles that control much of the shoulder and upper arm function. The subacromial bursa is a fluid filled sac that helps reduce friction between the rotator cuff and one of the bones of the shoulder (acromion). Impingement syndrome is usually an overuse injury that causes swelling of the bursa (bursitis), swelling of the tendon (tendonitis), and/or a tear of the tendon (strain). Strains are classified into three categories. Grade 1 strains cause pain, but the tendon is not lengthened. Grade 2 strains include a lengthened ligament, due to the ligament being stretched or partially ruptured. With grade 2 strains there is still function, although the function may be decreased. Grade 3 strains include a complete tear of the tendon or muscle, and function is usually impaired. SYMPTOMS   Pain around the shoulder, often at the outer portion of the upper arm.  Pain that gets worse with shoulder function, especially when reaching overhead or lifting.  Sometimes, aching when not using the arm.  Pain that wakes you up at night.  Sometimes, tenderness, swelling, warmth, or redness over the affected area.  Loss of strength.  Limited motion of the shoulder, especially reaching behind the back (to the back pocket or to unhook bra) or across your body.  Crackling sound (crepitation) when moving the  arm.  Biceps tendon pain and inflammation (in the front of the shoulder). Worse when bending the elbow or lifting. CAUSES  Impingement syndrome is often an overuse injury, in which chronic (repetitive) motions cause the tendons or bursa to become inflamed. A strain occurs when a force is paced on the tendon or muscle that is greater than it can withstand. Common mechanisms of injury include: Stress from sudden increase in duration, frequency, or intensity of training.  Direct hit (trauma) to the shoulder.  Aging, erosion of the tendon with normal use.  Bony bump on shoulder (acromial spur). RISK INCREASES WITH:  Contact sports (football, wrestling, boxing).  Throwing sports (baseball, tennis, volleyball).  Weightlifting and bodybuilding.  Heavy labor.  Previous injury to the rotator cuff, including impingement.  Poor shoulder strength and flexibility.  Failure to warm up properly before activity.  Inadequate protective equipment.  Old age.  Bony bump on shoulder (acromial spur). PREVENTION   Warm up and stretch properly before activity.  Allow for adequate recovery between workouts.  Maintain physical fitness:  Strength, flexibility, and endurance.  Cardiovascular fitness.  Learn and use proper exercise technique. PROGNOSIS  If treated properly, impingement syndrome usually goes away within 6 weeks. Sometimes surgery is required.  RELATED COMPLICATIONS   Longer healing time if not properly treated, or if not given enough time to heal.  Recurring symptoms, that result in a chronic condition.  Shoulder stiffness, frozen shoulder, or loss of motion.  Rotator cuff tendon tear.  Recurring symptoms, especially if activity is resumed too soon, with overuse, with a direct blow,  or when using poor technique. TREATMENT  Treatment first involves the use of ice and medicine, to reduce pain and inflammation. The use of strengthening and stretching exercises may help  reduce pain with activity. These exercises may be performed at home or with a therapist. If non-surgical treatment is unsuccessful after more than 6 months, surgery may be advised. After surgery and rehabilitation, activity is usually possible in 3 months.  MEDICATION  If pain medicine is needed, nonsteroidal anti-inflammatory medicines (aspirin and ibuprofen), or other minor pain relievers (acetaminophen), are often advised.  Do not take pain medicine for 7 days before surgery.  Prescription pain relievers may be given, if your caregiver thinks they are needed. Use only as directed and only as much as you need.  Corticosteroid injections may be given by your caregiver. These injections should be reserved for the most serious cases, because they may only be given a certain number of times. HEAT AND COLD  Cold treatment (icing) should be applied for 10 to 15 minutes every 2 to 3 hours for inflammation and pain, and immediately after activity that aggravates your symptoms. Use ice packs or an ice massage.  Heat treatment may be used before performing stretching and strengthening activities prescribed by your caregiver, physical therapist, or athletic trainer. Use a heat pack or a warm water soak. SEEK MEDICAL CARE IF:   Symptoms get worse or do not improve in 4 to 6 weeks, despite treatment.  New, unexplained symptoms develop. (Drugs used in treatment may produce side effects.) EXERCISES  RANGE OF MOTION (ROM) AND STRETCHING EXERCISES - Impingement Syndrome (Rotator Cuff  Tendinitis, Bursitis) These exercises may help you when beginning to rehabilitate your injury. Your symptoms may go away with or without further involvement from your physician, physical therapist or athletic trainer. While completing these exercises, remember:   Restoring tissue flexibility helps normal motion to return to the joints. This allows healthier, less painful movement and activity.  An effective stretch should  be held for at least 30 seconds.  A stretch should never be painful. You should only feel a gentle lengthening or release in the stretched tissue. STRETCH - Flexion, Standing  Stand with good posture. With an underhand grip on your right / left hand, and an overhand grip on the opposite hand, grasp a broomstick or cane so that your hands are a little more than shoulder width apart.  Keeping your right / left elbow straight and shoulder muscles relaxed, push the stick with your opposite hand, to raise your right / left arm in front of your body and then overhead. Raise your arm until you feel a stretch in your right / left shoulder, but before you have increased shoulder pain.  Try to avoid shrugging your right / left shoulder as your arm rises, by keeping your shoulder blade tucked down and toward your mid-back spine. Hold for __________ seconds.  Slowly return to the starting position. Repeat __________ times. Complete this exercise __________ times per day. STRETCH - Abduction, Supine  Lie on your back. With an underhand grip on your right / left hand and an overhand grip on the opposite hand, grasp a broomstick or cane so that your hands are a little more than shoulder width apart.  Keeping your right / left elbow straight and your shoulder muscles relaxed, push the stick with your opposite hand, to raise your right / left arm out to the side of your body and then overhead. Raise your arm until you feel  a stretch in your right / left shoulder, but before you have increased shoulder pain.  Try to avoid shrugging your right / left shoulder as your arm rises, by keeping your shoulder blade tucked down and toward your mid-back spine. Hold for __________ seconds.  Slowly return to the starting position. Repeat __________ times. Complete this exercise __________ times per day. ROM - Flexion, Active-Assisted  Lie on your back. You may bend your knees for comfort.  Grasp a broomstick or cane so  your hands are about shoulder width apart. Your right / left hand should grip the end of the stick, so that your hand is positioned "thumbs-up," as if you were about to shake hands.  Using your healthy arm to lead, raise your right / left arm overhead, until you feel a gentle stretch in your shoulder. Hold for __________ seconds.  Use the stick to assist in returning your right / left arm to its starting position. Repeat __________ times. Complete this exercise __________ times per day.  ROM - Internal Rotation, Supine   Lie on your back on a firm surface. Place your right / left elbow about 60 degrees away from your side. Elevate your elbow with a folded towel, so that the elbow and shoulder are the same height.  Using a broomstick or cane and your strong arm, pull your right / left hand toward your body until you feel a gentle stretch, but no increase in your shoulder pain. Keep your shoulder and elbow in place throughout the exercise.  Hold for __________ seconds. Slowly return to the starting position. Repeat __________ times. Complete this exercise __________ times per day. STRETCH - Internal Rotation  Place your right / left hand behind your back, palm up.  Throw a towel or belt over your opposite shoulder. Grasp the towel with your right / left hand.  While keeping an upright posture, gently pull up on the towel, until you feel a stretch in the front of your right / left shoulder.  Avoid shrugging your right / left shoulder as your arm rises, by keeping your shoulder blade tucked down and toward your mid-back spine.  Hold for __________ seconds. Release the stretch, by lowering your healthy hand. Repeat __________ times. Complete this exercise __________ times per day. ROM - Internal Rotation   Using an underhand grip, grasp a stick behind your back with both hands.  While standing upright with good posture, slide the stick up your back until you feel a mild stretch in the front  of your shoulder.  Hold for __________ seconds. Slowly return to your starting position. Repeat __________ times. Complete this exercise __________ times per day.  STRETCH - Posterior Shoulder Capsule   Stand or sit with good posture. Grasp your right / left elbow and draw it across your chest, keeping it at the same height as your shoulder.  Pull your elbow, so your upper arm comes in closer to your chest. Pull until you feel a gentle stretch in the back of your shoulder.  Hold for __________ seconds. Repeat __________ times. Complete this exercise __________ times per day. STRENGTHENING EXERCISES - Impingement Syndrome (Rotator Cuff Tendinitis, Bursitis) These exercises may help you when beginning to rehabilitate your injury. They may resolve your symptoms with or without further involvement from your physician, physical therapist or athletic trainer. While completing these exercises, remember:  Muscles can gain both the endurance and the strength needed for everyday activities through controlled exercises.  Complete these exercises as instructed  by your physician, physical therapist or athletic trainer. Increase the resistance and repetitions only as guided.  You may experience muscle soreness or fatigue, but the pain or discomfort you are trying to eliminate should never worsen during these exercises. If this pain does get worse, stop and make sure you are following the directions exactly. If the pain is still present after adjustments, discontinue the exercise until you can discuss the trouble with your clinician.  During your recovery, avoid activity or exercises which involve actions that place your injured hand or elbow above your head or behind your back or head. These positions stress the tissues which you are trying to heal. STRENGTH - Scapular Depression and Adduction   With good posture, sit on a firm chair. Support your arms in front of you, with pillows, arm rests, or on a  table top. Have your elbows in line with the sides of your body.  Gently draw your shoulder blades down and toward your mid-back spine. Gradually increase the tension, without tensing the muscles along the top of your shoulders and the back of your neck.  Hold for __________ seconds. Slowly release the tension and relax your muscles completely before starting the next repetition.  After you have practiced this exercise, remove the arm support and complete the exercise in standing as well as sitting position. Repeat __________ times. Complete this exercise __________ times per day.  STRENGTH - Shoulder Abductors, Isometric  With good posture, stand or sit about 4-6 inches from a wall, with your right / left side facing the wall.  Bend your right / left elbow. Gently press your right / left elbow into the wall. Increase the pressure gradually, until you are pressing as hard as you can, without shrugging your shoulder or increasing any shoulder discomfort.  Hold for __________ seconds.  Release the tension slowly. Relax your shoulder muscles completely before you begin the next repetition. Repeat __________ times. Complete this exercise __________ times per day.  STRENGTH - External Rotators, Isometric  Keep your right / left elbow at your side and bend it 90 degrees.  Step into a door frame so that the outside of your right / left wrist can press against the door frame without your upper arm leaving your side.  Gently press your right / left wrist into the door frame, as if you were trying to swing the back of your hand away from your stomach. Gradually increase the tension, until you are pressing as hard as you can, without shrugging your shoulder or increasing any shoulder discomfort.  Hold for __________ seconds.  Release the tension slowly. Relax your shoulder muscles completely before you begin the next repetition. Repeat __________ times. Complete this exercise __________ times per  day.  STRENGTH - Supraspinatus   Stand or sit with good posture. Grasp a __________ weight, or an exercise band or tubing, so that your hand is "thumbs-up," like you are shaking hands.  Slowly lift your right / left arm in a "V" away from your thigh, diagonally into the space between your side and straight ahead. Lift your hand to shoulder height or as far as you can, without increasing any shoulder pain. At first, many people do not lift their hands above shoulder height.  Avoid shrugging your right / left shoulder as your arm rises, by keeping your shoulder blade tucked down and toward your mid-back spine.  Hold for __________ seconds. Control the descent of your hand, as you slowly return to your starting  position. Repeat __________ times. Complete this exercise __________ times per day.  STRENGTH - External Rotators  Secure a rubber exercise band or tubing to a fixed object (table, pole) so that it is at the same height as your right / left elbow when you are standing or sitting on a firm surface.  Stand or sit so that the secured exercise band is at your uninjured side.  Bend your right / left elbow 90 degrees. Place a folded towel or small pillow under your right / left arm, so that your elbow is a few inches away from your side.  Keeping the tension on the exercise band, pull it away from your body, as if pivoting on your elbow. Be sure to keep your body steady, so that the movement is coming only from your rotating shoulder.  Hold for __________ seconds. Release the tension in a controlled manner, as you return to the starting position. Repeat __________ times. Complete this exercise __________ times per day.  STRENGTH - Internal Rotators   Secure a rubber exercise band or tubing to a fixed object (table, pole) so that it is at the same height as your right / left elbow when you are standing or sitting on a firm surface.  Stand or sit so that the secured exercise band is at your  right / left side.  Bend your elbow 90 degrees. Place a folded towel or small pillow under your right / left arm so that your elbow is a few inches away from your side.  Keeping the tension on the exercise band, pull it across your body, toward your stomach. Be sure to keep your body steady, so that the movement is coming only from your rotating shoulder.  Hold for __________ seconds. Release the tension in a controlled manner, as you return to the starting position. Repeat __________ times. Complete this exercise __________ times per day.  STRENGTH - Scapular Protractors, Standing   Stand arms length away from a wall. Place your hands on the wall, keeping your elbows straight.  Begin by dropping your shoulder blades down and toward your mid-back spine.  To strengthen your protractors, keep your shoulder blades down, but slide them forward on your rib cage. It will feel as if you are lifting the back of your rib cage away from the wall. This is a subtle motion and can be challenging to complete. Ask your caregiver for further instruction, if you are not sure you are doing the exercise correctly.  Hold for __________ seconds. Slowly return to the starting position, resting the muscles completely before starting the next repetition. Repeat __________ times. Complete this exercise __________ times per day. STRENGTH - Scapular Protractors, Supine  Lie on your back on a firm surface. Extend your right / left arm straight into the air while holding a __________ weight in your hand.  Keeping your head and back in place, lift your shoulder off the floor.  Hold for __________ seconds. Slowly return to the starting position, and allow your muscles to relax completely before starting the next repetition. Repeat __________ times. Complete this exercise __________ times per day. STRENGTH - Scapular Protractors, Quadruped  Get onto your hands and knees, with your shoulders directly over your hands (or  as close as you can be, comfortably).  Keeping your elbows locked, lift the back of your rib cage up into your shoulder blades, so your mid-back rounds out. Keep your neck muscles relaxed.  Hold this position for __________ seconds. Slowly  return to the starting position and allow your muscles to relax completely before starting the next repetition. Repeat __________ times. Complete this exercise __________ times per day.  STRENGTH - Scapular Retractors  Secure a rubber exercise band or tubing to a fixed object (table, pole), so that it is at the height of your shoulders when you are either standing, or sitting on a firm armless chair.  With a palm down grip, grasp an end of the band in each hand. Straighten your elbows and lift your hands straight in front of you, at shoulder height. Step back, away from the secured end of the band, until it becomes tense.  Squeezing your shoulder blades together, draw your elbows back toward your sides, as you bend them. Keep your upper arms lifted away from your body throughout the exercise.  Hold for __________ seconds. Slowly ease the tension on the band, as you reverse the directions and return to the starting position. Repeat __________ times. Complete this exercise __________ times per day. STRENGTH - Shoulder Extensors   Secure a rubber exercise band or tubing to a fixed object (table, pole) so that it is at the height of your shoulders when you are either standing, or sitting on a firm armless chair.  With a thumbs-up grip, grasp an end of the band in each hand. Straighten your elbows and lift your hands straight in front of you, at shoulder height. Step back, away from the secured end of the band, until it becomes tense.  Squeezing your shoulder blades together, pull your hands down to the sides of your thighs. Do not allow your hands to go behind you.  Hold for __________ seconds. Slowly ease the tension on the band, as you reverse the directions  and return to the starting position. Repeat __________ times. Complete this exercise __________ times per day.  STRENGTH - Scapular Retractors and External Rotators   Secure a rubber exercise band or tubing to a fixed object (table, pole) so that it is at the height as your shoulders, when you are either standing, or sitting on a firm armless chair.  With a palm down grip, grasp an end of the band in each hand. Bend your elbows 90 degrees and lift your elbows to shoulder height, at your sides. Step back, away from the secured end of the band, until it becomes tense.  Squeezing your shoulder blades together, rotate your shoulders so that your upper arms and elbows remain stationary, but your fists travel upward to head height.  Hold for __________ seconds. Slowly ease the tension on the band, as you reverse the directions and return to the starting position. Repeat __________ times. Complete this exercise __________ times per day.  STRENGTH - Scapular Retractors and External Rotators, Rowing   Secure a rubber exercise band or tubing to a fixed object (table, pole) so that it is at the height of your shoulders, when you are either standing, or sitting on a firm armless chair.  With a palm down grip, grasp an end of the band in each hand. Straighten your elbows and lift your hands straight in front of you, at shoulder height. Step back, away from the secured end of the band, until it becomes tense.  Step 1: Squeeze your shoulder blades together. Bending your elbows, draw your hands to your chest, as if you are rowing a boat. At the end of this motion, your hands and elbow should be at shoulder height and your elbows should be out  to your sides.  Step 2: Rotate your shoulders, to raise your hands above your head. Your forearms should be vertical and your upper arms should be horizontal.  Hold for __________ seconds. Slowly ease the tension on the band, as you reverse the directions and return to  the starting position. Repeat __________ times. Complete this exercise __________ times per day.  STRENGTH - Scapular Depressors  Find a sturdy chair without wheels, such as a dining room chair.  Keeping your feet on the floor, and your hands on the chair arms, lift your bottom up from the seat, and lock your elbows.  Keeping your elbows straight, allow gravity to pull your body weight down. Your shoulders will rise toward your ears.  Raise your body against gravity by drawing your shoulder blades down your back, shortening the distance between your shoulders and ears. Although your feet should always maintain contact with the floor, your feet should progressively support less body weight, as you get stronger.  Hold for __________ seconds. In a controlled and slow manner, lower your body weight to begin the next repetition. Repeat __________ times. Complete this exercise __________ times per day.    This information is not intended to replace advice given to you by your health care provider. Make sure you discuss any questions you have with your health care provider.   Document Released: 06/13/2005 Document Revised: 07/04/2014 Document Reviewed: 09/25/2008 Elsevier Interactive Patient Education [employer].

## 2015-05-01 NOTE — Assessment & Plan Note (Signed)
Improving with physical therapy. Continue physical therapy

## 2015-05-01 NOTE — Assessment & Plan Note (Signed)
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 She has degenerative changes on x-ray. She has failed to improve with dedicated physical therapy. Proceed with MRI of C-spine. Patient will return following MRI

## 2015-05-04 ENCOUNTER — Ambulatory Visit: Payer: 59 | Admitting: Family Medicine

## 2015-05-08 ENCOUNTER — Telehealth: Payer: Self-pay | Admitting: Family Medicine

## 2015-05-08 NOTE — Telephone Encounter (Signed)
Bonnita Nasuti advised.

## 2015-05-08 NOTE — Telephone Encounter (Signed)
Discussed with patient. She's feeling better following the injection in her shoulder. We'll wait for at least one month. If not better from shoulder standpoint we'll proceed with shoulder MRI.

## 2015-05-08 NOTE — Telephone Encounter (Signed)
Bonnita Nasuti from imaging called and patient is wondering why a shoulder mri wasn't ordered in addition to the c-spine mri???  Can you please clarify what patient needs with helen?  Thanks!

## 2015-05-11 ENCOUNTER — Ambulatory Visit (INDEPENDENT_AMBULATORY_CARE_PROVIDER_SITE_OTHER): Payer: 59

## 2015-05-11 DIAGNOSIS — M50323 Other cervical disc degeneration at C6-C7 level: Secondary | ICD-10-CM

## 2015-05-11 DIAGNOSIS — M542 Cervicalgia: Secondary | ICD-10-CM

## 2015-05-12 NOTE — Progress Notes (Signed)
Quick Note:  MRI does show bulging discs that could cause the pain. This will be a complicated discussion. Will discuss further on the 16th. ______

## 2015-05-13 ENCOUNTER — Encounter: Payer: Self-pay | Admitting: Family Medicine

## 2015-05-13 ENCOUNTER — Ambulatory Visit (INDEPENDENT_AMBULATORY_CARE_PROVIDER_SITE_OTHER): Payer: 59 | Admitting: Family Medicine

## 2015-05-13 VITALS — BP 145/91 | HR 87 | Wt 166.0 lb

## 2015-05-13 DIAGNOSIS — M7551 Bursitis of right shoulder: Secondary | ICD-10-CM

## 2015-05-13 DIAGNOSIS — M542 Cervicalgia: Secondary | ICD-10-CM | POA: Diagnosis not present

## 2015-05-13 NOTE — Patient Instructions (Signed)
Thank you for coming in today. Return following injection.

## 2015-05-13 NOTE — Progress Notes (Signed)
Samantha Price is a 54 y.o. female who presents to [facility]  today for neck and shoulder pain follow-up. Patient is here to discuss her MRI results. She was seen recently for follow-up of her shoulder and neck pain. She was thought to have radiculopathy from her cervical spine and of the right C4 or C5 dermatomes. MRI showed impingement of these areas. She's here to discuss findings and future plan. She additionally has shoulder pain is worse with overhand motion and reaching back. This is improved but not resolved following subacromial bursa injection.   Past Medical History  Diagnosis Date  . Diabetes mellitus     type 2  . Migraines   . Fibromyalgia   . Cyst     drained from Rt breast  . Herniated disc   . Wears glasses   . Anemia    Past Surgical History  Procedure Laterality Date  . Appendectomy    . Breast cyst excison      Left,   . Tubal ligation    . Colonoscopy w/ polypectomy      2007, Dr. Oletta Lamas.     Social History  Substance Use Topics  . Smoking status: Never Smoker   . Smokeless tobacco: Not on file  . Alcohol Use: Yes   family history includes Breast cancer in an other family member; Cancer in her father; Colon cancer in her other; Diabetes in her mother; Heart disease in her other; Leukemia in an other family member; Stroke in an other family member.  ROS as above Medications: Current Outpatient Prescriptions  Medication Sig Dispense Refill  . AMBULATORY NON FORMULARY MEDICATION Medication Name: Glucometer with strips and lancets to test 2-3 time per week. 1 Units PRN  . aspirin 81 MG tablet Take 81 mg by mouth daily.      . betamethasone dipropionate (DIPROLENE) 0.05 % cream Apply topically 2 (two) times daily. To affected area right leg 45 g 0  . Ginkgo Biloba 40 MG TABS Take 40 mg by mouth daily.      . Linagliptin-Metformin HCl (JENTADUETO) 2.10-998 MG TABS Take 1 tablet by mouth 2 (two) times daily. 60 tablet  6  . lisinopril-hydrochlorothiazide (ZESTORETIC) 20-12.5 MG per tablet Take 1 tablet by mouth 2 (two) times daily. 60 tablet 6  . methocarbamol (ROBAXIN) 500 MG tablet Take 1 tablet (500 mg total) by mouth 3 (three) times daily. 90 tablet 0  . Multiple Vitamin (MULTIVITAMIN PO) Take by mouth daily.      Marland Kitchen topiramate (TOPAMAX) 50 MG tablet Take 1 tablet (50 mg total) by mouth 2 (two) times daily. 60 tablet 5  . traMADol (ULTRAM) 50 MG tablet Take 1 tablet (50 mg total) by mouth every 8 (eight) hours as needed. 30 tablet 0   No current facility-administered medications for this visit.   No Known Allergies   Exam:  BP 145/91 mmHg  Pulse 87  Wt 166 lb (75.297 kg)  LMP 06/28/2011 Gen: Well NAD Neck is nontender with normal motion. Shoulder is nontender. Some pain with abduction and positive impingement testing is present mildly. Pulses Refill sensation are intact distally.    No results found for this or any previous visit (from the past 24 hour(s)). Mr Cervical Spine Wo Contrast  05/11/2015  CLINICAL DATA:  Right-sided neck pain beginning May 2016. Pain extends into the right shoulder and arm to the fingers with numbness and tingling. EXAM: MRI CERVICAL SPINE WITHOUT CONTRAST TECHNIQUE: Multiplanar, multisequence  MR imaging of the cervical spine was performed. No intravenous contrast was administered. COMPARISON:  MRI of the cervical spine 04/28/2006 FINDINGS: Normal signal is present in the cervical and upper thoracic spinal cord to the lowest imaged level, T2-3. There straightening of the normal cervical lordosis as before. Marrow signal and vertebral body heights are maintained. Alignment is anatomic. Craniocervical junction is within normal limits. Flow is present in the vertebral arteries. C2-3:  Negative. C3-4: A leftward disc osteophyte complex is stable. Mild left foraminal narrowing is unchanged. C4-5: A broad-based disc osteophyte complex is asymmetric to the right. There is some  progression with effacement of the ventral CSF and potential contact of the cord. The foramina are patent. C5-6: A broad-based disc osteophyte complex effaces the ventral CSF. Moderate foraminal narrowing has progressed bilaterally. C6-7: A shallow right paramedian disc protrusion is present without significant stenosis. C7-T1:  Negative. IMPRESSION: 1. Progressive broad-based disc osteophyte complex with moderate foraminal narrowing bilaterally at L5-S1. 2. Asymmetric rightward disc osteophyte complex with some progression and effacement of ventral CSF with mild to moderate central canal stenosis and potential contact of the cord at C4-5. 3. Mild left foraminal narrowing at C3-4 is stable. 4. Shallow right paramedian disc protrusion at C6-7 without significant stenosis. Electronically Signed   By: San Morelle M.D.   On: 05/11/2015 17:11     Please see individual assessment and plan sections.

## 2015-05-15 NOTE — Assessment & Plan Note (Signed)
Patient does have some pain likely due to subacromial bursitis versus rotator cuff tendinitis. If pain has not significantly improved following cervical epidural injection would consider MRI versus trial of repeat shoulder injection.

## 2015-05-15 NOTE — Assessment & Plan Note (Signed)
I believe the predominant pain generator to be C4-5 radicular pain from her cervical spine. Plan for translaminar epidural injection at C6-7. Return following injection.

## 2015-05-19 ENCOUNTER — Other Ambulatory Visit: Payer: Self-pay | Admitting: Family Medicine

## 2015-06-01 ENCOUNTER — Ambulatory Visit
Admission: RE | Admit: 2015-06-01 | Discharge: 2015-06-01 | Disposition: A | Discharge status: Home / Self Care | Payer: 59 | Source: Ambulatory Visit | Attending: Family Medicine | Admitting: Family Medicine

## 2015-06-01 VITALS — BP 145/88 | HR 79

## 2015-06-01 DIAGNOSIS — M542 Cervicalgia: Secondary | ICD-10-CM

## 2015-06-01 MED ORDER — TRIAMCINOLONE ACETONIDE 40 MG/ML IJ SUSP (RADIOLOGY)
60.0000 mg | Freq: Once | INTRAMUSCULAR | Status: AC
  Administered 2015-06-01: 60 mg via EPIDURAL

## 2015-06-01 MED ORDER — DIAZEPAM 5 MG PO TABS
5.0000 mg | ORAL_TABLET | Freq: Once | ORAL | Status: AC
  Administered 2015-06-01: 5 mg via ORAL

## 2015-06-01 MED ORDER — IOHEXOL 300 MG/ML  SOLN
1.0000 mL | Freq: Once | INTRAMUSCULAR | Status: AC | PRN
  Administered 2015-06-01: 1 mL via INTRAVENOUS

## 2015-06-01 NOTE — Discharge Instructions (Signed)

## 2015-06-08 ENCOUNTER — Encounter: Payer: Self-pay | Admitting: Family Medicine

## 2015-06-08 ENCOUNTER — Ambulatory Visit (INDEPENDENT_AMBULATORY_CARE_PROVIDER_SITE_OTHER): Payer: 59 | Admitting: Family Medicine

## 2015-06-08 VITALS — BP 148/78 | HR 64 | Wt 160.0 lb

## 2015-06-08 DIAGNOSIS — M7551 Bursitis of right shoulder: Secondary | ICD-10-CM

## 2015-06-08 DIAGNOSIS — M542 Cervicalgia: Secondary | ICD-10-CM | POA: Diagnosis not present

## 2015-06-08 NOTE — Patient Instructions (Signed)
Thank you for coming in today. Call or go to the ER if you develop a large red swollen joint with extreme pain or oozing puss.  Get MRI if not better.  Return following MRI.

## 2015-06-08 NOTE — Progress Notes (Signed)
Samantha Price is a 54 y.o. female who presents to [facility] today for follow-up right neck and shoulder pain. Patient received a right cervical epidural injection on December 5. She had significant improvement in symptoms into her scapular back and trapezius. She continues to note shoulder pain into the lateral upper arm. Pain is worse with overhead motion reaching back. She had a subacromial injection in early October followed by physical therapy which has not helped much. She denies pain radiating to her hand weakness or numbness fevers or chills. No nausea vomiting or diarrhea.   Past Medical History  Diagnosis Date  . Diabetes mellitus     type 2  . Migraines   . Fibromyalgia   . Cyst     drained from Rt breast  . Herniated disc   . Wears glasses   . Anemia    Past Surgical History  Procedure Laterality Date  . Appendectomy    . Breast cyst excison      Left,   . Tubal ligation    . Colonoscopy w/ polypectomy      2007, Dr. Oletta Lamas.     Social History  Substance Use Topics  . Smoking status: Never Smoker   . Smokeless tobacco: Not on file  . Alcohol Use: Yes   family history includes Breast cancer in an other family member; Cancer in her father; Colon cancer in her other; Diabetes in her mother; Heart disease in her other; Leukemia in an other family member; Stroke in an other family member.  ROS as above Medications: Current Outpatient Prescriptions  Medication Sig Dispense Refill  . AMBULATORY NON FORMULARY MEDICATION Medication Name: Glucometer with strips and lancets to test 2-3 time per week. 1 Units PRN  . aspirin 81 MG tablet Take 81 mg by mouth daily.      . betamethasone dipropionate (DIPROLENE) 0.05 % cream Apply topically 2 (two) times daily. To affected area right leg 45 g 0  . Ginkgo Biloba 40 MG TABS Take 40 mg by mouth daily.      . Linagliptin-Metformin HCl (JENTADUETO) 2.10-998 MG TABS Take 1 tablet by mouth 2  (two) times daily. 60 tablet 6  . lisinopril-hydrochlorothiazide (PRINZIDE,ZESTORETIC) 20-12.5 MG tablet Take 1 tablet by mouth 2 (two) times daily. PATIENT NEEDS APPOINTMENT FOR FURTHER REFILLS 60 tablet 0  . methocarbamol (ROBAXIN) 500 MG tablet Take 1 tablet (500 mg total) by mouth 3 (three) times daily. 90 tablet 0  . Multiple Vitamin (MULTIVITAMIN PO) Take by mouth daily.      Marland Kitchen topiramate (TOPAMAX) 50 MG tablet Take 1 tablet (50 mg total) by mouth 2 (two) times daily. 60 tablet 5  . traMADol (ULTRAM) 50 MG tablet Take 1 tablet (50 mg total) by mouth every 8 (eight) hours as needed. 30 tablet 0   No current facility-administered medications for this visit.   Allergies  Allergen Reactions  . Latex Rash     Exam:  BP 148/78 mmHg  Pulse 64  Wt 160 lb (72.576 kg)  LMP 06/28/2011 Gen: Well NAD HEENT: EOMI,  MMM Lungs: Normal work of breathing. CTABL Heart: RRR no MRG Abd: NABS, Soft. Nondistended, Nontender Exts: Brisk capillary refill, warm and well perfused.  MSK: Right shoulder is normal-appearing. Acromioclavicular joint is tender to palpation. Range of motion is intact. Positive crossover arm compression test. Positive Hawkins and Neer's test. Strength is intact. Pulses capillary refill and sensation are intact distally.  Procedure: Real-time Ultrasound Guided Injection  of right acromioclavicular joint  Device: GE Logiq E  Images permanently stored and available for review in the ultrasound unit. Verbal informed consent obtained. Discussed risks and benefits of procedure. Warned about infection bleeding damage to structures skin hypopigmentation and fat atrophy among others. Patient expresses understanding and agreement Time-out conducted.  Noted no overlying erythema, induration, or other signs of local infection.  Skin prepped in a sterile fashion.  Local anesthesia: Topical Ethyl chloride.  With sterile technique and under real time ultrasound guidance: 40 mg  of Depo-Medrol and 1 mL of Marcaine injected easily.  Completed without difficulty  Pain did not improve much following injection Advised to call if fevers/chills, erythema, induration, drainage, or persistent bleeding.  Images permanently stored and available for review in the ultrasound unit.  Impression: Technically successful ultrasound guided injection.    No results found for this or any previous visit (from the past 24 hour(s)). No results found.   Please see individual assessment and plan sections.

## 2015-06-08 NOTE — Assessment & Plan Note (Signed)
Much improved with epidural steroid injection.

## 2015-06-08 NOTE — Assessment & Plan Note (Signed)
Patient continues to have shoulder pain and did not have much improvement with second injection. At this point her shoulder pain diagnosis is unclear. Plan for MRI. Return following MRI

## 2015-06-22 ENCOUNTER — Other Ambulatory Visit: Payer: Self-pay | Admitting: Family Medicine

## 2015-07-06 ENCOUNTER — Ambulatory Visit (INDEPENDENT_AMBULATORY_CARE_PROVIDER_SITE_OTHER): Payer: 59

## 2015-07-06 DIAGNOSIS — M7551 Bursitis of right shoulder: Secondary | ICD-10-CM | POA: Diagnosis not present

## 2015-07-07 NOTE — Progress Notes (Signed)
Quick Note:  MRI shows mild tendonitis of the rotator cuff. Return for further discussion. ______

## 2015-07-08 ENCOUNTER — Ambulatory Visit (INDEPENDENT_AMBULATORY_CARE_PROVIDER_SITE_OTHER): Payer: 59 | Admitting: Family Medicine

## 2015-07-08 VITALS — BP 139/99 | HR 96 | Wt 155.0 lb

## 2015-07-08 DIAGNOSIS — M7551 Bursitis of right shoulder: Secondary | ICD-10-CM | POA: Diagnosis not present

## 2015-07-08 NOTE — Assessment & Plan Note (Signed)
Definitive on MRI today. Plan for injection and return to physical therapy. Return in one month.

## 2015-07-08 NOTE — Patient Instructions (Signed)
Thank you for coming in today. Call or go to the ER if you develop a large red swollen joint with extreme pain or oozing puss.  Return in a month or so.   Impingement Syndrome, Rotator Cuff, Bursitis With Rehab Impingement syndrome is a condition that involves inflammation of the tendons of the rotator cuff and the subacromial bursa, that causes pain in the shoulder. The rotator cuff consists of four tendons and muscles that control much of the shoulder and upper arm function. The subacromial bursa is a fluid filled sac that helps reduce friction between the rotator cuff and one of the bones of the shoulder (acromion). Impingement syndrome is usually an overuse injury that causes swelling of the bursa (bursitis), swelling of the tendon (tendonitis), and/or a tear of the tendon (strain). Strains are classified into three categories. Grade 1 strains cause pain, but the tendon is not lengthened. Grade 2 strains include a lengthened ligament, due to the ligament being stretched or partially ruptured. With grade 2 strains there is still function, although the function may be decreased. Grade 3 strains include a complete tear of the tendon or muscle, and function is usually impaired. SYMPTOMS   Pain around the shoulder, often at the outer portion of the upper arm.  Pain that gets worse with shoulder function, especially when reaching overhead or lifting.  Sometimes, aching when not using the arm.  Pain that wakes you up at night.  Sometimes, tenderness, swelling, warmth, or redness over the affected area.  Loss of strength.  Limited motion of the shoulder, especially reaching behind the back (to the back pocket or to unhook bra) or across your body.  Crackling sound (crepitation) when moving the arm.  Biceps tendon pain and inflammation (in the front of the shoulder). Worse when bending the elbow or lifting. CAUSES  Impingement syndrome is often an overuse injury, in which chronic (repetitive)  motions cause the tendons or bursa to become inflamed. A strain occurs when a force is paced on the tendon or muscle that is greater than it can withstand. Common mechanisms of injury include: Stress from sudden increase in duration, frequency, or intensity of training.  Direct hit (trauma) to the shoulder.  Aging, erosion of the tendon with normal use.  Bony bump on shoulder (acromial spur). RISK INCREASES WITH:  Contact sports (football, wrestling, boxing).  Throwing sports (baseball, tennis, volleyball).  Weightlifting and bodybuilding.  Heavy labor.  Previous injury to the rotator cuff, including impingement.  Poor shoulder strength and flexibility.  Failure to warm up properly before activity.  Inadequate protective equipment.  Old age.  Bony bump on shoulder (acromial spur). PREVENTION   Warm up and stretch properly before activity.  Allow for adequate recovery between workouts.  Maintain physical fitness:  Strength, flexibility, and endurance.  Cardiovascular fitness.  Learn and use proper exercise technique. PROGNOSIS  If treated properly, impingement syndrome usually goes away within 6 weeks. Sometimes surgery is required.  RELATED COMPLICATIONS   Longer healing time if not properly treated, or if not given enough time to heal.  Recurring symptoms, that result in a chronic condition.  Shoulder stiffness, frozen shoulder, or loss of motion.  Rotator cuff tendon tear.  Recurring symptoms, especially if activity is resumed too soon, with overuse, with a direct blow, or when using poor technique. TREATMENT  Treatment first involves the use of ice and medicine, to reduce pain and inflammation. The use of strengthening and stretching exercises may help reduce pain with activity. These  exercises may be performed at home or with a therapist. If non-surgical treatment is unsuccessful after more than 6 months, surgery may be advised. After surgery and  rehabilitation, activity is usually possible in 3 months.  MEDICATION  If pain medicine is needed, nonsteroidal anti-inflammatory medicines (aspirin and ibuprofen), or other minor pain relievers (acetaminophen), are often advised.  Do not take pain medicine for 7 days before surgery.  Prescription pain relievers may be given, if your caregiver thinks they are needed. Use only as directed and only as much as you need.  Corticosteroid injections may be given by your caregiver. These injections should be reserved for the most serious cases, because they may only be given a certain number of times. HEAT AND COLD  Cold treatment (icing) should be applied for 10 to 15 minutes every 2 to 3 hours for inflammation and pain, and immediately after activity that aggravates your symptoms. Use ice packs or an ice massage.  Heat treatment may be used before performing stretching and strengthening activities prescribed by your caregiver, physical therapist, or athletic trainer. Use a heat pack or a warm water soak. SEEK MEDICAL CARE IF:   Symptoms get worse or do not improve in 4 to 6 weeks, despite treatment.  New, unexplained symptoms develop. (Drugs used in treatment may produce side effects.) EXERCISES  RANGE OF MOTION (ROM) AND STRETCHING EXERCISES - Impingement Syndrome (Rotator Cuff  Tendinitis, Bursitis) These exercises may help you when beginning to rehabilitate your injury. Your symptoms may go away with or without further involvement from your physician, physical therapist or athletic trainer. While completing these exercises, remember:   Restoring tissue flexibility helps normal motion to return to the joints. This allows healthier, less painful movement and activity.  An effective stretch should be held for at least 30 seconds.  A stretch should never be painful. You should only feel a gentle lengthening or release in the stretched tissue. STRETCH - Flexion, Standing  Stand with good  posture. With an underhand grip on your right / left hand, and an overhand grip on the opposite hand, grasp a broomstick or cane so that your hands are a little more than shoulder width apart.  Keeping your right / left elbow straight and shoulder muscles relaxed, push the stick with your opposite hand, to raise your right / left arm in front of your body and then overhead. Raise your arm until you feel a stretch in your right / left shoulder, but before you have increased shoulder pain.  Try to avoid shrugging your right / left shoulder as your arm rises, by keeping your shoulder blade tucked down and toward your mid-back spine. Hold for __________ seconds.  Slowly return to the starting position. Repeat __________ times. Complete this exercise __________ times per day. STRETCH - Abduction, Supine  Lie on your back. With an underhand grip on your right / left hand and an overhand grip on the opposite hand, grasp a broomstick or cane so that your hands are a little more than shoulder width apart.  Keeping your right / left elbow straight and your shoulder muscles relaxed, push the stick with your opposite hand, to raise your right / left arm out to the side of your body and then overhead. Raise your arm until you feel a stretch in your right / left shoulder, but before you have increased shoulder pain.  Try to avoid shrugging your right / left shoulder as your arm rises, by keeping your shoulder blade tucked  down and toward your mid-back spine. Hold for __________ seconds.  Slowly return to the starting position. Repeat __________ times. Complete this exercise __________ times per day. ROM - Flexion, Active-Assisted  Lie on your back. You may bend your knees for comfort.  Grasp a broomstick or cane so your hands are about shoulder width apart. Your right / left hand should grip the end of the stick, so that your hand is positioned "thumbs-up," as if you were about to shake hands.  Using your  healthy arm to lead, raise your right / left arm overhead, until you feel a gentle stretch in your shoulder. Hold for __________ seconds.  Use the stick to assist in returning your right / left arm to its starting position. Repeat __________ times. Complete this exercise __________ times per day.  ROM - Internal Rotation, Supine   Lie on your back on a firm surface. Place your right / left elbow about 60 degrees away from your side. Elevate your elbow with a folded towel, so that the elbow and shoulder are the same height.  Using a broomstick or cane and your strong arm, pull your right / left hand toward your body until you feel a gentle stretch, but no increase in your shoulder pain. Keep your shoulder and elbow in place throughout the exercise.  Hold for __________ seconds. Slowly return to the starting position. Repeat __________ times. Complete this exercise __________ times per day. STRETCH - Internal Rotation  Place your right / left hand behind your back, palm up.  Throw a towel or belt over your opposite shoulder. Grasp the towel with your right / left hand.  While keeping an upright posture, gently pull up on the towel, until you feel a stretch in the front of your right / left shoulder.  Avoid shrugging your right / left shoulder as your arm rises, by keeping your shoulder blade tucked down and toward your mid-back spine.  Hold for __________ seconds. Release the stretch, by lowering your healthy hand. Repeat __________ times. Complete this exercise __________ times per day. ROM - Internal Rotation   Using an underhand grip, grasp a stick behind your back with both hands.  While standing upright with good posture, slide the stick up your back until you feel a mild stretch in the front of your shoulder.  Hold for __________ seconds. Slowly return to your starting position. Repeat __________ times. Complete this exercise __________ times per day.  STRETCH - Posterior Shoulder  Capsule   Stand or sit with good posture. Grasp your right / left elbow and draw it across your chest, keeping it at the same height as your shoulder.  Pull your elbow, so your upper arm comes in closer to your chest. Pull until you feel a gentle stretch in the back of your shoulder.  Hold for __________ seconds. Repeat __________ times. Complete this exercise __________ times per day. STRENGTHENING EXERCISES - Impingement Syndrome (Rotator Cuff Tendinitis, Bursitis) These exercises may help you when beginning to rehabilitate your injury. They may resolve your symptoms with or without further involvement from your physician, physical therapist or athletic trainer. While completing these exercises, remember:  Muscles can gain both the endurance and the strength needed for everyday activities through controlled exercises.  Complete these exercises as instructed by your physician, physical therapist or athletic trainer. Increase the resistance and repetitions only as guided.  You may experience muscle soreness or fatigue, but the pain or discomfort you are trying to eliminate should  never worsen during these exercises. If this pain does get worse, stop and make sure you are following the directions exactly. If the pain is still present after adjustments, discontinue the exercise until you can discuss the trouble with your clinician.  During your recovery, avoid activity or exercises which involve actions that place your injured hand or elbow above your head or behind your back or head. These positions stress the tissues which you are trying to heal. STRENGTH - Scapular Depression and Adduction   With good posture, sit on a firm chair. Support your arms in front of you, with pillows, arm rests, or on a table top. Have your elbows in line with the sides of your body.  Gently draw your shoulder blades down and toward your mid-back spine. Gradually increase the tension, without tensing the muscles  along the top of your shoulders and the back of your neck.  Hold for __________ seconds. Slowly release the tension and relax your muscles completely before starting the next repetition.  After you have practiced this exercise, remove the arm support and complete the exercise in standing as well as sitting position. Repeat __________ times. Complete this exercise __________ times per day.  STRENGTH - Shoulder Abductors, Isometric  With good posture, stand or sit about 4-6 inches from a wall, with your right / left side facing the wall.  Bend your right / left elbow. Gently press your right / left elbow into the wall. Increase the pressure gradually, until you are pressing as hard as you can, without shrugging your shoulder or increasing any shoulder discomfort.  Hold for __________ seconds.  Release the tension slowly. Relax your shoulder muscles completely before you begin the next repetition. Repeat __________ times. Complete this exercise __________ times per day.  STRENGTH - External Rotators, Isometric  Keep your right / left elbow at your side and bend it 90 degrees.  Step into a door frame so that the outside of your right / left wrist can press against the door frame without your upper arm leaving your side.  Gently press your right / left wrist into the door frame, as if you were trying to swing the back of your hand away from your stomach. Gradually increase the tension, until you are pressing as hard as you can, without shrugging your shoulder or increasing any shoulder discomfort.  Hold for __________ seconds.  Release the tension slowly. Relax your shoulder muscles completely before you begin the next repetition. Repeat __________ times. Complete this exercise __________ times per day.  STRENGTH - Supraspinatus   Stand or sit with good posture. Grasp a __________ weight, or an exercise band or tubing, so that your hand is "thumbs-up," like you are shaking hands.  Slowly  lift your right / left arm in a "V" away from your thigh, diagonally into the space between your side and straight ahead. Lift your hand to shoulder height or as far as you can, without increasing any shoulder pain. At first, many people do not lift their hands above shoulder height.  Avoid shrugging your right / left shoulder as your arm rises, by keeping your shoulder blade tucked down and toward your mid-back spine.  Hold for __________ seconds. Control the descent of your hand, as you slowly return to your starting position. Repeat __________ times. Complete this exercise __________ times per day.  STRENGTH - External Rotators  Secure a rubber exercise band or tubing to a fixed object (table, pole) so that it is at  the same height as your right / left elbow when you are standing or sitting on a firm surface.  Stand or sit so that the secured exercise band is at your uninjured side.  Bend your right / left elbow 90 degrees. Place a folded towel or small pillow under your right / left arm, so that your elbow is a few inches away from your side.  Keeping the tension on the exercise band, pull it away from your body, as if pivoting on your elbow. Be sure to keep your body steady, so that the movement is coming only from your rotating shoulder.  Hold for __________ seconds. Release the tension in a controlled manner, as you return to the starting position. Repeat __________ times. Complete this exercise __________ times per day.  STRENGTH - Internal Rotators   Secure a rubber exercise band or tubing to a fixed object (table, pole) so that it is at the same height as your right / left elbow when you are standing or sitting on a firm surface.  Stand or sit so that the secured exercise band is at your right / left side.  Bend your elbow 90 degrees. Place a folded towel or small pillow under your right / left arm so that your elbow is a few inches away from your side.  Keeping the tension on the  exercise band, pull it across your body, toward your stomach. Be sure to keep your body steady, so that the movement is coming only from your rotating shoulder.  Hold for __________ seconds. Release the tension in a controlled manner, as you return to the starting position. Repeat __________ times. Complete this exercise __________ times per day.  STRENGTH - Scapular Protractors, Standing   Stand arms length away from a wall. Place your hands on the wall, keeping your elbows straight.  Begin by dropping your shoulder blades down and toward your mid-back spine.  To strengthen your protractors, keep your shoulder blades down, but slide them forward on your rib cage. It will feel as if you are lifting the back of your rib cage away from the wall. This is a subtle motion and can be challenging to complete. Ask your caregiver for further instruction, if you are not sure you are doing the exercise correctly.  Hold for __________ seconds. Slowly return to the starting position, resting the muscles completely before starting the next repetition. Repeat __________ times. Complete this exercise __________ times per day. STRENGTH - Scapular Protractors, Supine  Lie on your back on a firm surface. Extend your right / left arm straight into the air while holding a __________ weight in your hand.  Keeping your head and back in place, lift your shoulder off the floor.  Hold for __________ seconds. Slowly return to the starting position, and allow your muscles to relax completely before starting the next repetition. Repeat __________ times. Complete this exercise __________ times per day. STRENGTH - Scapular Protractors, Quadruped  Get onto your hands and knees, with your shoulders directly over your hands (or as close as you can be, comfortably).  Keeping your elbows locked, lift the back of your rib cage up into your shoulder blades, so your mid-back rounds out. Keep your neck muscles relaxed.  Hold  this position for __________ seconds. Slowly return to the starting position and allow your muscles to relax completely before starting the next repetition. Repeat __________ times. Complete this exercise __________ times per day.  STRENGTH - Scapular Retractors  Secure a  rubber exercise band or tubing to a fixed object (table, pole), so that it is at the height of your shoulders when you are either standing, or sitting on a firm armless chair.  With a palm down grip, grasp an end of the band in each hand. Straighten your elbows and lift your hands straight in front of you, at shoulder height. Step back, away from the secured end of the band, until it becomes tense.  Squeezing your shoulder blades together, draw your elbows back toward your sides, as you bend them. Keep your upper arms lifted away from your body throughout the exercise.  Hold for __________ seconds. Slowly ease the tension on the band, as you reverse the directions and return to the starting position. Repeat __________ times. Complete this exercise __________ times per day. STRENGTH - Shoulder Extensors   Secure a rubber exercise band or tubing to a fixed object (table, pole) so that it is at the height of your shoulders when you are either standing, or sitting on a firm armless chair.  With a thumbs-up grip, grasp an end of the band in each hand. Straighten your elbows and lift your hands straight in front of you, at shoulder height. Step back, away from the secured end of the band, until it becomes tense.  Squeezing your shoulder blades together, pull your hands down to the sides of your thighs. Do not allow your hands to go behind you.  Hold for __________ seconds. Slowly ease the tension on the band, as you reverse the directions and return to the starting position. Repeat __________ times. Complete this exercise __________ times per day.  STRENGTH - Scapular Retractors and External Rotators   Secure a rubber exercise  band or tubing to a fixed object (table, pole) so that it is at the height as your shoulders, when you are either standing, or sitting on a firm armless chair.  With a palm down grip, grasp an end of the band in each hand. Bend your elbows 90 degrees and lift your elbows to shoulder height, at your sides. Step back, away from the secured end of the band, until it becomes tense.  Squeezing your shoulder blades together, rotate your shoulders so that your upper arms and elbows remain stationary, but your fists travel upward to head height.  Hold for __________ seconds. Slowly ease the tension on the band, as you reverse the directions and return to the starting position. Repeat __________ times. Complete this exercise __________ times per day.  STRENGTH - Scapular Retractors and External Rotators, Rowing   Secure a rubber exercise band or tubing to a fixed object (table, pole) so that it is at the height of your shoulders, when you are either standing, or sitting on a firm armless chair.  With a palm down grip, grasp an end of the band in each hand. Straighten your elbows and lift your hands straight in front of you, at shoulder height. Step back, away from the secured end of the band, until it becomes tense.  Step 1: Squeeze your shoulder blades together. Bending your elbows, draw your hands to your chest, as if you are rowing a boat. At the end of this motion, your hands and elbow should be at shoulder height and your elbows should be out to your sides.  Step 2: Rotate your shoulders, to raise your hands above your head. Your forearms should be vertical and your upper arms should be horizontal.  Hold for __________ seconds. Slowly ease  the tension on the band, as you reverse the directions and return to the starting position. Repeat __________ times. Complete this exercise __________ times per day.  STRENGTH - Scapular Depressors  Find a sturdy chair without wheels, such as a dining room  chair.  Keeping your feet on the floor, and your hands on the chair arms, lift your bottom up from the seat, and lock your elbows.  Keeping your elbows straight, allow gravity to pull your body weight down. Your shoulders will rise toward your ears.  Raise your body against gravity by drawing your shoulder blades down your back, shortening the distance between your shoulders and ears. Although your feet should always maintain contact with the floor, your feet should progressively support less body weight, as you get stronger.  Hold for __________ seconds. In a controlled and slow manner, lower your body weight to begin the next repetition. Repeat __________ times. Complete this exercise __________ times per day.    This information is not intended to replace advice given to you by your health care provider. Make sure you discuss any questions you have with your health care provider.   Document Released: 06/13/2005 Document Revised: 07/04/2014 Document Reviewed: 09/25/2008 [employer] Interactive Patient Education [employer].

## 2015-07-08 NOTE — Progress Notes (Signed)
Samantha Price is a 55 y.o. female who presents to [facility] today for follow-up shoulder pain. Patient was seen last month for follow-up neck and shoulder pain. She feels almost 100% better following a epidural steroid injection to her cervical spine. However she continues to have right shoulder pain. She had an elective acromioclavicular steroid injection on December 12 which did not help at all. In the interim she's had an MRI to her shoulder to determine definitively what the diagnosis is. She notes continued mild to moderate shoulder pain worse with overhead motion and reaching back.   Past Medical History  Diagnosis Date  . Diabetes mellitus     type 2  . Migraines   . Fibromyalgia   . Cyst     drained from Rt breast  . Herniated disc   . Wears glasses   . Anemia    Past Surgical History  Procedure Laterality Date  . Appendectomy    . Breast cyst excison      Left,   . Tubal ligation    . Colonoscopy w/ polypectomy      2007, Dr. Oletta Lamas.     Social History  Substance Use Topics  . Smoking status: Never Smoker   . Smokeless tobacco: Not on file  . Alcohol Use: Yes   family history includes Cancer in her father; Colon cancer in her other; Diabetes in her mother; Heart disease in her other.  ROS as above Medications: Current Outpatient Prescriptions  Medication Sig Dispense Refill  . AMBULATORY NON FORMULARY MEDICATION Medication Name: Glucometer with strips and lancets to test 2-3 time per week. 1 Units PRN  . aspirin 81 MG tablet Take 81 mg by mouth daily.      . betamethasone dipropionate (DIPROLENE) 0.05 % cream Apply topically 2 (two) times daily. To affected area right leg 45 g 0  . Ginkgo Biloba 40 MG TABS Take 40 mg by mouth daily.      . Linagliptin-Metformin HCl (JENTADUETO) 2.10-998 MG TABS Take 1 tablet by mouth 2 (two) times daily. 60 tablet 6    . lisinopril-hydrochlorothiazide (PRINZIDE,ZESTORETIC) 20-12.5 MG tablet Take 1 tablet by mouth 2 (two) times daily. PATIENT NEEDS APPOINTMENT FOR FURTHER REFILLS 60 tablet 0  . lisinopril-hydrochlorothiazide (PRINZIDE,ZESTORETIC) 20-12.5 MG tablet TAKE ONE TABLET BY MOUTH TWICE DAILY 60 tablet 0  . methocarbamol (ROBAXIN) 500 MG tablet Take 1 tablet (500 mg total) by mouth 3 (three) times daily. 90 tablet 0  . Multiple Vitamin (MULTIVITAMIN PO) Take by mouth daily.      Marland Kitchen topiramate (TOPAMAX) 50 MG tablet Take 1 tablet (50 mg total) by mouth 2 (two) times daily. 60 tablet 5  . traMADol (ULTRAM) 50 MG tablet Take 1 tablet (50 mg total) by mouth every 8 (eight) hours as needed. 30 tablet 0   No current facility-administered medications for this visit.   Allergies  Allergen Reactions  . Latex Rash     Exam:  BP 139/99 mmHg  Pulse 96  Wt 155 lb (70.308 kg)  LMP 06/28/2011  Gen: Well NAD Right shoulder is normal appearing and nontender. Full range of motion. Positive impingement testing.   Procedure: Real-time Ultrasound Guided Injection of right subacromial bursa  Device: GE Logiq E  Images permanently stored and available for review in the ultrasound unit. Verbal informed consent obtained. Discussed risks and benefits of procedure. Warned about infection bleeding damage to structures skin hypopigmentation and fat atrophy  among others. Patient expresses understanding and agreement Time-out conducted.  Noted no overlying erythema, induration, or other signs of local infection.  Skin prepped in a sterile fashion.  Local anesthesia: Topical Ethyl chloride.  With sterile technique and under real time ultrasound guidance: 40 mg of Kenalog and 4 mL of Marcaine injected easily.  Completed without difficulty  Pain immediately resolved suggesting accurate placement of the medication.  Advised to call if fevers/chills, erythema, induration, drainage, or persistent bleeding.   Images permanently stored and available for review in the ultrasound unit.  Impression: Technically successful ultrasound guided injection.    No results found for this or any previous visit (from the past 24 hour(s)). Mr Shoulder Right Wo Contrast  07/06/2015  CLINICAL DATA:  Decreased range of motion.  Weakness. EXAM: MRI OF THE RIGHT SHOULDER WITHOUT CONTRAST TECHNIQUE: Multiplanar, multisequence MR imaging of the shoulder was performed. No intravenous contrast was administered. COMPARISON:  None. FINDINGS: Rotator cuff: Mild tendinosis of the supraspinatus and infraspinatus tendons with mild fraying along the bursal surface of the supraspinatus tendon 1.5 cm from the insertion. Teres minor tendon is intact. Subscapularis tendon is intact. Muscles: No atrophy or fatty replacement of nor abnormal signal within, the muscles of the rotator cuff. Biceps long head:  Intact. Acromioclavicular Joint: Mild degenerative changes of the acromioclavicular joint. Type II acromion. Trace subacromial/subdeltoid bursal fluid. Glenohumeral Joint: No joint effusion.  No chondral defect. Labrum: Grossly intact, but evaluation is limited by lack of intraarticular fluid. Bones:  No marrow signal abnormality.  No fracture or dislocation. IMPRESSION: 1. Mild tendinosis of the supraspinatus and infraspinatus tendons with mild fraying along the bursal surface of the supraspinatus tendon 1.5 cm from the insertion. 2. Mild subacromial/subdeltoid bursitis. Electronically Signed   By: Kathreen Devoid   On: 07/06/2015 16:40     Please see individual assessment and plan sections.

## 2015-07-23 ENCOUNTER — Other Ambulatory Visit: Payer: Self-pay | Admitting: Family Medicine

## 2015-08-26 ENCOUNTER — Other Ambulatory Visit: Payer: Self-pay | Admitting: Family Medicine

## 2015-08-28 ENCOUNTER — Ambulatory Visit: Payer: 59 | Admitting: Family Medicine

## 2015-08-31 ENCOUNTER — Telehealth: Payer: Self-pay | Admitting: Family Medicine

## 2015-08-31 NOTE — Telephone Encounter (Signed)
I called patient to schedule her for a follow up appointment on BP and she stated she was driving and will call tomorrow to schedule her appointment - CF

## 2015-09-11 ENCOUNTER — Encounter: Payer: Self-pay | Admitting: Family Medicine

## 2015-09-11 ENCOUNTER — Ambulatory Visit (INDEPENDENT_AMBULATORY_CARE_PROVIDER_SITE_OTHER): Payer: 59 | Admitting: Family Medicine

## 2015-09-11 VITALS — BP 128/76 | HR 65 | Wt 153.0 lb

## 2015-09-11 DIAGNOSIS — I10 Essential (primary) hypertension: Secondary | ICD-10-CM

## 2015-09-11 DIAGNOSIS — Z114 Encounter for screening for human immunodeficiency virus [HIV]: Secondary | ICD-10-CM | POA: Diagnosis not present

## 2015-09-11 DIAGNOSIS — Z1159 Encounter for screening for other viral diseases: Secondary | ICD-10-CM

## 2015-09-11 DIAGNOSIS — Z1231 Encounter for screening mammogram for malignant neoplasm of breast: Secondary | ICD-10-CM

## 2015-09-11 DIAGNOSIS — E119 Type 2 diabetes mellitus without complications: Secondary | ICD-10-CM | POA: Diagnosis not present

## 2015-09-11 DIAGNOSIS — D179 Benign lipomatous neoplasm, unspecified: Secondary | ICD-10-CM | POA: Insufficient documentation

## 2015-09-11 LAB — POCT GLYCOSYLATED HEMOGLOBIN (HGB A1C): Hemoglobin A1C: 5.7

## 2015-09-11 MED ORDER — LISINOPRIL-HYDROCHLOROTHIAZIDE 20-12.5 MG PO TABS: ORAL_TABLET | ORAL | Status: DC

## 2015-09-11 NOTE — Progress Notes (Signed)
   Subjective:    Patient ID: Samantha Price, female    DOB: 1960-11-11, 55 y.o.   MRN: [id number]  HPI Diabetes - no hypoglycemic events. No wounds or sores that are not healing well. No increased thirst or urination. Checking glucose at home. Taking medications as prescribed without any side effects.Been working really hard to exercise and work out and lose weight.  Hypertension- Pt denies chest pain, SOB, dizziness, or heart palpitations.  Taking meds as directed w/o problems.  Denies medication side effects.    She also has a lump on her right thigh. 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 Unfortunately near her old scar she feels like she's getting a new lump. She noticed a few weeks ago. It's not painful or tender or bothersome.   Review of Systems     Objective:   Physical Exam  Constitutional: She is oriented to person, place, and time. She appears well-developed and well-nourished.  HENT:  Head: Normocephalic and atraumatic.  Cardiovascular: Normal rate, regular rhythm and normal heart sounds.   Pulmonary/Chest: Effort normal and breath sounds normal.  Neurological: She is alert and oriented to person, place, and time.  Skin: Skin is warm and dry.  Psychiatric: She has a normal mood and affect. Her behavior is normal.    She has an approximately 4-5 cm round smooth lipoma on the right thigh just medial to her old incision.      Assessment & Plan:  Diabetes-well-controlled. Hemoglobin down to 5.7 which is fantastic. And he knew current regimen.  Hypertension-well-controlled. Continue current regimen. Follow-up in 3 mo.   Lipoma-gave reassurance about the benign nature of the lesion. I be happy to refer her back to surgeon for removal. Or she can certainly monitor it over the next 6 months to see if she feels like it's getting larger. She wants to think about it and let me know.

## 2015-09-14 ENCOUNTER — Other Ambulatory Visit: Payer: Self-pay | Admitting: Family Medicine

## 2015-09-19 LAB — COMPLETE METABOLIC PANEL WITH GFR
ALT: 15 U/L (ref 6–29)
AST: 17 U/L (ref 10–35)
Albumin: 4 g/dL (ref 3.6–5.1)
Alkaline Phosphatase: 51 U/L (ref 33–130)
BUN: 11 mg/dL (ref 7–25)
CHLORIDE: 105 mmol/L (ref 98–110)
CO2: 23 mmol/L (ref 20–31)
Calcium: 9.7 mg/dL (ref 8.6–10.4)
Creat: 0.76 mg/dL (ref 0.50–1.05)
GFR, EST NON AFRICAN AMERICAN: 88 mL/min (ref >=60)
GFR, Est African American: >89 mL/min (ref >=60)
GLUCOSE: 116 mg/dL — AB (ref 65–99)
POTASSIUM: 3.4 mmol/L — AB (ref 3.5–5.3)
SODIUM: 142 mmol/L (ref 135–146)
TOTAL PROTEIN: 6.7 g/dL (ref 6.1–8.1)
Total Bilirubin: 0.4 mg/dL (ref 0.2–1.2)

## 2015-09-19 LAB — LIPID PANEL
CHOL/HDL RATIO: 3.2 ratio (ref <=5.0)
CHOLESTEROL: 209 mg/dL — AB (ref 125–200)
HDL: 66 mg/dL (ref >=46)
LDL CALC: 107 mg/dL (ref <130)
TRIGLYCERIDES: 178 mg/dL — AB (ref <150)
VLDL: 36 mg/dL — AB (ref <30)

## 2015-09-19 LAB — HEPATITIS C ANTIBODY: HCV Ab: NEGATIVE

## 2015-09-19 LAB — HIV ANTIBODY (ROUTINE TESTING W REFLEX): HIV 1&2 Ab, 4th Generation: NONREACTIVE

## 2015-09-25 ENCOUNTER — Other Ambulatory Visit: Payer: Self-pay | Admitting: Family Medicine

## 2015-10-22 ENCOUNTER — Other Ambulatory Visit: Payer: Self-pay | Admitting: Family Medicine

## 2015-12-11 ENCOUNTER — Other Ambulatory Visit: Payer: Self-pay | Admitting: Family Medicine

## 2015-12-14 ENCOUNTER — Ambulatory Visit (INDEPENDENT_AMBULATORY_CARE_PROVIDER_SITE_OTHER): Payer: 59 | Admitting: Family Medicine

## 2015-12-14 ENCOUNTER — Encounter: Payer: Self-pay | Admitting: Family Medicine

## 2015-12-14 VITALS — BP 146/80 | HR 76 | Wt 150.0 lb

## 2015-12-14 DIAGNOSIS — M7551 Bursitis of right shoulder: Secondary | ICD-10-CM | POA: Diagnosis not present

## 2015-12-14 NOTE — Patient Instructions (Signed)
Thank you for coming in today. Call or go to the ER if you develop a large red swollen joint with extreme pain or oozing puss.  Attend PT.  Return in 1-2 months.   Impingement Syndrome, Rotator Cuff, Bursitis With Rehab Impingement syndrome is a condition that involves inflammation of the tendons of the rotator cuff and the subacromial bursa, that causes pain in the shoulder. The rotator cuff consists of four tendons and muscles that control much of the shoulder and upper arm function. The subacromial bursa is a fluid filled sac that helps reduce friction between the rotator cuff and one of the bones of the shoulder (acromion). Impingement syndrome is usually an overuse injury that causes swelling of the bursa (bursitis), swelling of the tendon (tendonitis), and/or a tear of the tendon (strain). Strains are classified into three categories. Grade 1 strains cause pain, but the tendon is not lengthened. Grade 2 strains include a lengthened ligament, due to the ligament being stretched or partially ruptured. With grade 2 strains there is still function, although the function may be decreased. Grade 3 strains include a complete tear of the tendon or muscle, and function is usually impaired. SYMPTOMS   Pain around the shoulder, often at the outer portion of the upper arm.  Pain that gets worse with shoulder function, especially when reaching overhead or lifting.  Sometimes, aching when not using the arm.  Pain that wakes you up at night.  Sometimes, tenderness, swelling, warmth, or redness over the affected area.  Loss of strength.  Limited motion of the shoulder, especially reaching behind the back (to the back pocket or to unhook bra) or across your body.  Crackling sound (crepitation) when moving the arm.  Biceps tendon pain and inflammation (in the front of the shoulder). Worse when bending the elbow or lifting. CAUSES  Impingement syndrome is often an overuse injury, in which chronic  (repetitive) motions cause the tendons or bursa to become inflamed. A strain occurs when a force is paced on the tendon or muscle that is greater than it can withstand. Common mechanisms of injury include: Stress from sudden increase in duration, frequency, or intensity of training.  Direct hit (trauma) to the shoulder.  Aging, erosion of the tendon with normal use.  Bony bump on shoulder (acromial spur). RISK INCREASES WITH:  Contact sports (football, wrestling, boxing).  Throwing sports (baseball, tennis, volleyball).  Weightlifting and bodybuilding.  Heavy labor.  Previous injury to the rotator cuff, including impingement.  Poor shoulder strength and flexibility.  Failure to warm up properly before activity.  Inadequate protective equipment.  Old age.  Bony bump on shoulder (acromial spur). PREVENTION   Warm up and stretch properly before activity.  Allow for adequate recovery between workouts.  Maintain physical fitness:  Strength, flexibility, and endurance.  Cardiovascular fitness.  Learn and use proper exercise technique. PROGNOSIS  If treated properly, impingement syndrome usually goes away within 6 weeks. Sometimes surgery is required.  RELATED COMPLICATIONS   Longer healing time if not properly treated, or if not given enough time to heal.  Recurring symptoms, that result in a chronic condition.  Shoulder stiffness, frozen shoulder, or loss of motion.  Rotator cuff tendon tear.  Recurring symptoms, especially if activity is resumed too soon, with overuse, with a direct blow, or when using poor technique. TREATMENT  Treatment first involves the use of ice and medicine, to reduce pain and inflammation. The use of strengthening and stretching exercises may help reduce pain with activity.  These exercises may be performed at home or with a therapist. If non-surgical treatment is unsuccessful after more than 6 months, surgery may be advised. After surgery  and rehabilitation, activity is usually possible in 3 months.  MEDICATION  If pain medicine is needed, nonsteroidal anti-inflammatory medicines (aspirin and ibuprofen), or other minor pain relievers (acetaminophen), are often advised.  Do not take pain medicine for 7 days before surgery.  Prescription pain relievers may be given, if your caregiver thinks they are needed. Use only as directed and only as much as you need.  Corticosteroid injections may be given by your caregiver. These injections should be reserved for the most serious cases, because they may only be given a certain number of times. HEAT AND COLD  Cold treatment (icing) should be applied for 10 to 15 minutes every 2 to 3 hours for inflammation and pain, and immediately after activity that aggravates your symptoms. Use ice packs or an ice massage.  Heat treatment may be used before performing stretching and strengthening activities prescribed by your caregiver, physical therapist, or athletic trainer. Use a heat pack or a warm water soak. SEEK MEDICAL CARE IF:   Symptoms get worse or do not improve in 4 to 6 weeks, despite treatment.  New, unexplained symptoms develop. (Drugs used in treatment may produce side effects.) EXERCISES  RANGE OF MOTION (ROM) AND STRETCHING EXERCISES - Impingement Syndrome (Rotator Cuff  Tendinitis, Bursitis) These exercises may help you when beginning to rehabilitate your injury. Your symptoms may go away with or without further involvement from your physician, physical therapist or athletic trainer. While completing these exercises, remember:   Restoring tissue flexibility helps normal motion to return to the joints. This allows healthier, less painful movement and activity.  An effective stretch should be held for at least 30 seconds.  A stretch should never be painful. You should only feel a gentle lengthening or release in the stretched tissue. STRETCH - Flexion, Standing  Stand with good  posture. With an underhand grip on your right / left hand, and an overhand grip on the opposite hand, grasp a broomstick or cane so that your hands are a little more than shoulder width apart.  Keeping your right / left elbow straight and shoulder muscles relaxed, push the stick with your opposite hand, to raise your right / left arm in front of your body and then overhead. Raise your arm until you feel a stretch in your right / left shoulder, but before you have increased shoulder pain.  Try to avoid shrugging your right / left shoulder as your arm rises, by keeping your shoulder blade tucked down and toward your mid-back spine. Hold for __________ seconds.  Slowly return to the starting position. Repeat __________ times. Complete this exercise __________ times per day. STRETCH - Abduction, Supine  Lie on your back. With an underhand grip on your right / left hand and an overhand grip on the opposite hand, grasp a broomstick or cane so that your hands are a little more than shoulder width apart.  Keeping your right / left elbow straight and your shoulder muscles relaxed, push the stick with your opposite hand, to raise your right / left arm out to the side of your body and then overhead. Raise your arm until you feel a stretch in your right / left shoulder, but before you have increased shoulder pain.  Try to avoid shrugging your right / left shoulder as your arm rises, by keeping your shoulder blade  tucked down and toward your mid-back spine. Hold for __________ seconds.  Slowly return to the starting position. Repeat __________ times. Complete this exercise __________ times per day. ROM - Flexion, Active-Assisted  Lie on your back. You may bend your knees for comfort.  Grasp a broomstick or cane so your hands are about shoulder width apart. Your right / left hand should grip the end of the stick, so that your hand is positioned "thumbs-up," as if you were about to shake hands.  Using your  healthy arm to lead, raise your right / left arm overhead, until you feel a gentle stretch in your shoulder. Hold for __________ seconds.  Use the stick to assist in returning your right / left arm to its starting position. Repeat __________ times. Complete this exercise __________ times per day.  ROM - Internal Rotation, Supine   Lie on your back on a firm surface. Place your right / left elbow about 60 degrees away from your side. Elevate your elbow with a folded towel, so that the elbow and shoulder are the same height.  Using a broomstick or cane and your strong arm, pull your right / left hand toward your body until you feel a gentle stretch, but no increase in your shoulder pain. Keep your shoulder and elbow in place throughout the exercise.  Hold for __________ seconds. Slowly return to the starting position. Repeat __________ times. Complete this exercise __________ times per day. STRETCH - Internal Rotation  Place your right / left hand behind your back, palm up.  Throw a towel or belt over your opposite shoulder. Grasp the towel with your right / left hand.  While keeping an upright posture, gently pull up on the towel, until you feel a stretch in the front of your right / left shoulder.  Avoid shrugging your right / left shoulder as your arm rises, by keeping your shoulder blade tucked down and toward your mid-back spine.  Hold for __________ seconds. Release the stretch, by lowering your healthy hand. Repeat __________ times. Complete this exercise __________ times per day. ROM - Internal Rotation   Using an underhand grip, grasp a stick behind your back with both hands.  While standing upright with good posture, slide the stick up your back until you feel a mild stretch in the front of your shoulder.  Hold for __________ seconds. Slowly return to your starting position. Repeat __________ times. Complete this exercise __________ times per day.  STRETCH - Posterior Shoulder  Capsule   Stand or sit with good posture. Grasp your right / left elbow and draw it across your chest, keeping it at the same height as your shoulder.  Pull your elbow, so your upper arm comes in closer to your chest. Pull until you feel a gentle stretch in the back of your shoulder.  Hold for __________ seconds. Repeat __________ times. Complete this exercise __________ times per day. STRENGTHENING EXERCISES - Impingement Syndrome (Rotator Cuff Tendinitis, Bursitis) These exercises may help you when beginning to rehabilitate your injury. They may resolve your symptoms with or without further involvement from your physician, physical therapist or athletic trainer. While completing these exercises, remember:  Muscles can gain both the endurance and the strength needed for everyday activities through controlled exercises.  Complete these exercises as instructed by your physician, physical therapist or athletic trainer. Increase the resistance and repetitions only as guided.  You may experience muscle soreness or fatigue, but the pain or discomfort you are trying to eliminate  should never worsen during these exercises. If this pain does get worse, stop and make sure you are following the directions exactly. If the pain is still present after adjustments, discontinue the exercise until you can discuss the trouble with your clinician.  During your recovery, avoid activity or exercises which involve actions that place your injured hand or elbow above your head or behind your back or head. These positions stress the tissues which you are trying to heal. STRENGTH - Scapular Depression and Adduction   With good posture, sit on a firm chair. Support your arms in front of you, with pillows, arm rests, or on a table top. Have your elbows in line with the sides of your body.  Gently draw your shoulder blades down and toward your mid-back spine. Gradually increase the tension, without tensing the muscles  along the top of your shoulders and the back of your neck.  Hold for __________ seconds. Slowly release the tension and relax your muscles completely before starting the next repetition.  After you have practiced this exercise, remove the arm support and complete the exercise in standing as well as sitting position. Repeat __________ times. Complete this exercise __________ times per day.  STRENGTH - Shoulder Abductors, Isometric  With good posture, stand or sit about 4-6 inches from a wall, with your right / left side facing the wall.  Bend your right / left elbow. Gently press your right / left elbow into the wall. Increase the pressure gradually, until you are pressing as hard as you can, without shrugging your shoulder or increasing any shoulder discomfort.  Hold for __________ seconds.  Release the tension slowly. Relax your shoulder muscles completely before you begin the next repetition. Repeat __________ times. Complete this exercise __________ times per day.  STRENGTH - External Rotators, Isometric  Keep your right / left elbow at your side and bend it 90 degrees.  Step into a door frame so that the outside of your right / left wrist can press against the door frame without your upper arm leaving your side.  Gently press your right / left wrist into the door frame, as if you were trying to swing the back of your hand away from your stomach. Gradually increase the tension, until you are pressing as hard as you can, without shrugging your shoulder or increasing any shoulder discomfort.  Hold for __________ seconds.  Release the tension slowly. Relax your shoulder muscles completely before you begin the next repetition. Repeat __________ times. Complete this exercise __________ times per day.  STRENGTH - Supraspinatus   Stand or sit with good posture. Grasp a __________ weight, or an exercise band or tubing, so that your hand is "thumbs-up," like you are shaking hands.  Slowly  lift your right / left arm in a "V" away from your thigh, diagonally into the space between your side and straight ahead. Lift your hand to shoulder height or as far as you can, without increasing any shoulder pain. At first, many people do not lift their hands above shoulder height.  Avoid shrugging your right / left shoulder as your arm rises, by keeping your shoulder blade tucked down and toward your mid-back spine.  Hold for __________ seconds. Control the descent of your hand, as you slowly return to your starting position. Repeat __________ times. Complete this exercise __________ times per day.  STRENGTH - External Rotators  Secure a rubber exercise band or tubing to a fixed object (table, pole) so that it is  at the same height as your right / left elbow when you are standing or sitting on a firm surface.  Stand or sit so that the secured exercise band is at your uninjured side.  Bend your right / left elbow 90 degrees. Place a folded towel or small pillow under your right / left arm, so that your elbow is a few inches away from your side.  Keeping the tension on the exercise band, pull it away from your body, as if pivoting on your elbow. Be sure to keep your body steady, so that the movement is coming only from your rotating shoulder.  Hold for __________ seconds. Release the tension in a controlled manner, as you return to the starting position. Repeat __________ times. Complete this exercise __________ times per day.  STRENGTH - Internal Rotators   Secure a rubber exercise band or tubing to a fixed object (table, pole) so that it is at the same height as your right / left elbow when you are standing or sitting on a firm surface.  Stand or sit so that the secured exercise band is at your right / left side.  Bend your elbow 90 degrees. Place a folded towel or small pillow under your right / left arm so that your elbow is a few inches away from your side.  Keeping the tension on the  exercise band, pull it across your body, toward your stomach. Be sure to keep your body steady, so that the movement is coming only from your rotating shoulder.  Hold for __________ seconds. Release the tension in a controlled manner, as you return to the starting position. Repeat __________ times. Complete this exercise __________ times per day.  STRENGTH - Scapular Protractors, Standing   Stand arms length away from a wall. Place your hands on the wall, keeping your elbows straight.  Begin by dropping your shoulder blades down and toward your mid-back spine.  To strengthen your protractors, keep your shoulder blades down, but slide them forward on your rib cage. It will feel as if you are lifting the back of your rib cage away from the wall. This is a subtle motion and can be challenging to complete. Ask your caregiver for further instruction, if you are not sure you are doing the exercise correctly.  Hold for __________ seconds. Slowly return to the starting position, resting the muscles completely before starting the next repetition. Repeat __________ times. Complete this exercise __________ times per day. STRENGTH - Scapular Protractors, Supine  Lie on your back on a firm surface. Extend your right / left arm straight into the air while holding a __________ weight in your hand.  Keeping your head and back in place, lift your shoulder off the floor.  Hold for __________ seconds. Slowly return to the starting position, and allow your muscles to relax completely before starting the next repetition. Repeat __________ times. Complete this exercise __________ times per day. STRENGTH - Scapular Protractors, Quadruped  Get onto your hands and knees, with your shoulders directly over your hands (or as close as you can be, comfortably).  Keeping your elbows locked, lift the back of your rib cage up into your shoulder blades, so your mid-back rounds out. Keep your neck muscles relaxed.  Hold  this position for __________ seconds. Slowly return to the starting position and allow your muscles to relax completely before starting the next repetition. Repeat __________ times. Complete this exercise __________ times per day.  STRENGTH - Scapular Retractors  Secure  a rubber exercise band or tubing to a fixed object (table, pole), so that it is at the height of your shoulders when you are either standing, or sitting on a firm armless chair.  With a palm down grip, grasp an end of the band in each hand. Straighten your elbows and lift your hands straight in front of you, at shoulder height. Step back, away from the secured end of the band, until it becomes tense.  Squeezing your shoulder blades together, draw your elbows back toward your sides, as you bend them. Keep your upper arms lifted away from your body throughout the exercise.  Hold for __________ seconds. Slowly ease the tension on the band, as you reverse the directions and return to the starting position. Repeat __________ times. Complete this exercise __________ times per day. STRENGTH - Shoulder Extensors   Secure a rubber exercise band or tubing to a fixed object (table, pole) so that it is at the height of your shoulders when you are either standing, or sitting on a firm armless chair.  With a thumbs-up grip, grasp an end of the band in each hand. Straighten your elbows and lift your hands straight in front of you, at shoulder height. Step back, away from the secured end of the band, until it becomes tense.  Squeezing your shoulder blades together, pull your hands down to the sides of your thighs. Do not allow your hands to go behind you.  Hold for __________ seconds. Slowly ease the tension on the band, as you reverse the directions and return to the starting position. Repeat __________ times. Complete this exercise __________ times per day.  STRENGTH - Scapular Retractors and External Rotators   Secure a rubber exercise  band or tubing to a fixed object (table, pole) so that it is at the height as your shoulders, when you are either standing, or sitting on a firm armless chair.  With a palm down grip, grasp an end of the band in each hand. Bend your elbows 90 degrees and lift your elbows to shoulder height, at your sides. Step back, away from the secured end of the band, until it becomes tense.  Squeezing your shoulder blades together, rotate your shoulders so that your upper arms and elbows remain stationary, but your fists travel upward to head height.  Hold for __________ seconds. Slowly ease the tension on the band, as you reverse the directions and return to the starting position. Repeat __________ times. Complete this exercise __________ times per day.  STRENGTH - Scapular Retractors and External Rotators, Rowing   Secure a rubber exercise band or tubing to a fixed object (table, pole) so that it is at the height of your shoulders, when you are either standing, or sitting on a firm armless chair.  With a palm down grip, grasp an end of the band in each hand. Straighten your elbows and lift your hands straight in front of you, at shoulder height. Step back, away from the secured end of the band, until it becomes tense.  Step 1: Squeeze your shoulder blades together. Bending your elbows, draw your hands to your chest, as if you are rowing a boat. At the end of this motion, your hands and elbow should be at shoulder height and your elbows should be out to your sides.  Step 2: Rotate your shoulders, to raise your hands above your head. Your forearms should be vertical and your upper arms should be horizontal.  Hold for __________ seconds. Slowly  ease the tension on the band, as you reverse the directions and return to the starting position. Repeat __________ times. Complete this exercise __________ times per day.  STRENGTH - Scapular Depressors  Find a sturdy chair without wheels, such as a dining room  chair.  Keeping your feet on the floor, and your hands on the chair arms, lift your bottom up from the seat, and lock your elbows.  Keeping your elbows straight, allow gravity to pull your body weight down. Your shoulders will rise toward your ears.  Raise your body against gravity by drawing your shoulder blades down your back, shortening the distance between your shoulders and ears. Although your feet should always maintain contact with the floor, your feet should progressively support less body weight, as you get stronger.  Hold for __________ seconds. In a controlled and slow manner, lower your body weight to begin the next repetition. Repeat __________ times. Complete this exercise __________ times per day.    This information is not intended to replace advice given to you by your health care provider. Make sure you discuss any questions you have with your health care provider.   Document Released: 06/13/2005 Document Revised: 07/04/2014 Document Reviewed: 09/25/2008 [employer] Interactive Patient Education [employer].

## 2015-12-15 NOTE — Progress Notes (Signed)
Samantha Price is a 55 y.o. female who presents to [place] today for right shoulder pain. Patient returns to clinic today complaining of right shoulder pain. She had right shoulder pain about 6 months ago which ultimately was due to a combination of subacromial bursitis rotator cuff tendinitis and acromioclavicular DJD. Additionally she had cervical radiculopathy. She received injections and physical therapy and did well until recently. She notes right shoulder pain worse with overhead motion reaching back rated moderate. The pain does interfere with her activity. She notes occasional tingling and pain to the second and third digits on her right hand as well. No fevers or chills or recent injury.   Past Medical History  Diagnosis Date  . Diabetes mellitus     type 2  . Migraines   . Fibromyalgia   . Cyst     drained from Rt breast  . Herniated disc   . Wears glasses   . Anemia    Past Surgical History  Procedure Laterality Date  . Appendectomy    . Breast cyst excison      Left,   . Tubal ligation    . Colonoscopy w/ polypectomy      2007, Dr. Oletta Lamas.     Social History  Substance Use Topics  . Smoking status: Never Smoker   . Smokeless tobacco: Not on file  . Alcohol Use: Yes   family history includes Cancer in her father; Colon cancer in her other; Diabetes in her mother; Heart disease in her other.  ROS:  No headache, visual changes, nausea, vomiting, diarrhea, constipation, dizziness, abdominal pain, skin rash, fevers, chills, night sweats, weight loss, swollen lymph nodes, body aches, joint swelling, muscle aches, chest pain, shortness of breath, mood changes, visual or auditory hallucinations.    Medications: Current Outpatient Prescriptions  Medication Sig Dispense Refill  . AMBULATORY NON FORMULARY MEDICATION Medication Name: Glucometer with strips and lancets to test 2-3 time per week. 1 Units PRN  . aspirin  81 MG tablet Take 81 mg by mouth daily.      . Ginkgo Biloba 40 MG TABS Take 40 mg by mouth daily.      . JENTADUETO 2.10-998 MG TABS TAKE ONE TABLET BY MOUTH TWICE DAILY 60 tablet 0  . lisinopril-hydrochlorothiazide (PRINZIDE,ZESTORETIC) 20-12.5 MG tablet TAKE ONE TABLET BY MOUTH TWICE DAILY. 30 tablet 6  . Multiple Vitamin (MULTIVITAMIN PO) Take by mouth daily.      Marland Kitchen topiramate (TOPAMAX) 50 MG tablet TAKE ONE TABLET BY MOUTH TWICE DAILY 60 tablet 0  . traMADol (ULTRAM) 50 MG tablet Take 1 tablet (50 mg total) by mouth every 8 (eight) hours as needed. 30 tablet 0   No current facility-administered medications for this visit.   Allergies  Allergen Reactions  . Latex Rash     Exam:  BP 146/80 mmHg  Pulse 76  Wt 150 lb (68.04 kg)  LMP 06/28/2011 General: Well Developed, well nourished, and in no acute distress.  Neuro/Psych: Alert and oriented x3, extra-ocular muscles intact, able to move all 4 extremities, sensation grossly intact. Skin: Warm and dry, no rashes noted.  Respiratory: Not using accessory muscles, speaking in full sentences, trachea midline.  Cardiovascular: Pulses palpable, no extremity edema. Abdomen: Does not appear distended. MSK: Neck: Nontender to midline normal neck motion. Shoulder normal-appearing nontender normal motion pain with abduction Positive Hawkins and Neer's test. Positive empty can test. Negative Yergason's and speeds test. Normal strength throughout.   Procedure:  Real-time Ultrasound Guided Injection of right subacromial bursa  Device: GE Logiq E  Images permanently stored and available for review in the ultrasound unit. Verbal informed consent obtained. Discussed risks and benefits of procedure. Warned about infection bleeding damage to structures skin hypopigmentation and fat atrophy among others. Patient expresses understanding and agreement Time-out conducted.  Noted no overlying erythema, induration, or other signs of local infection.   Skin prepped in a sterile fashion.  Local anesthesia: Topical Ethyl chloride.  With sterile technique and under real time ultrasound guidance: 40 mg of Kenalog and 3 mL of Marcaine injected easily.  Completed without difficulty  Pain immediately resolved suggesting accurate placement of the medication.  Advised to call if fevers/chills, erythema, induration, drainage, or persistent bleeding.  Images permanently stored and available for review in the ultrasound unit.  Impression: Technically successful ultrasound guided injection.  MRI right shoulder dated 07/06/2015 reviewed  No results found for this or any previous visit (from the past 24 hour(s)). No results found.   Assessment and Plan: 55 y.o. female with  Right shoulder pain. Likely resumption of bursitis with some acromioclavicular component. Patient may have some mild cervical radiculopathy as well. Plan for bursa injection followed by physical therapy. Return in about a month. Ultimately if she continues to have bothersome symptom she may benefit from surgery.  CC: METHENEY,CATHERINE, MD

## 2015-12-21 ENCOUNTER — Other Ambulatory Visit: Payer: Self-pay | Admitting: *Deleted

## 2015-12-21 MED ORDER — LISINOPRIL-HYDROCHLOROTHIAZIDE 20-12.5 MG PO TABS: ORAL_TABLET | ORAL | Status: DC

## 2016-01-19 ENCOUNTER — Other Ambulatory Visit: Payer: Self-pay | Admitting: Family Medicine

## 2016-02-02 ENCOUNTER — Ambulatory Visit (INDEPENDENT_AMBULATORY_CARE_PROVIDER_SITE_OTHER): Payer: 59 | Admitting: Family Medicine

## 2016-02-02 ENCOUNTER — Encounter: Payer: Self-pay | Admitting: Family Medicine

## 2016-02-02 ENCOUNTER — Telehealth: Payer: Self-pay | Admitting: Family Medicine

## 2016-02-02 VITALS — BP 162/78 | HR 59 | Wt 148.0 lb

## 2016-02-02 DIAGNOSIS — Z1231 Encounter for screening mammogram for malignant neoplasm of breast: Secondary | ICD-10-CM

## 2016-02-02 DIAGNOSIS — E119 Type 2 diabetes mellitus without complications: Secondary | ICD-10-CM | POA: Diagnosis not present

## 2016-02-02 DIAGNOSIS — R928 Other abnormal and inconclusive findings on diagnostic imaging of breast: Secondary | ICD-10-CM

## 2016-02-02 DIAGNOSIS — I10 Essential (primary) hypertension: Secondary | ICD-10-CM

## 2016-02-02 DIAGNOSIS — L723 Sebaceous cyst: Secondary | ICD-10-CM | POA: Diagnosis not present

## 2016-02-02 LAB — BASIC METABOLIC PANEL
BUN: 10 mg/dL (ref 7–25)
CHLORIDE: 103 mmol/L (ref 98–110)
CO2: 27 mmol/L (ref 20–31)
Calcium: 9.8 mg/dL (ref 8.6–10.4)
Creat: 0.83 mg/dL (ref 0.50–1.05)
GLUCOSE: 79 mg/dL (ref 65–99)
POTASSIUM: 3.8 mmol/L (ref 3.5–5.3)
SODIUM: 139 mmol/L (ref 135–146)

## 2016-02-02 LAB — POCT GLYCOSYLATED HEMOGLOBIN (HGB A1C): HEMOGLOBIN A1C: 5.3

## 2016-02-02 MED ORDER — TOPIRAMATE 50 MG PO TABS: 50.0000 mg | ORAL_TABLET | Freq: Two times a day (BID) | ORAL | 3 refills | Status: DC

## 2016-02-02 MED ORDER — LINAGLIPTIN-METFORMIN HCL 2.5-1000 MG PO TABS: 1.0000 | ORAL_TABLET | Freq: Two times a day (BID) | ORAL | 6 refills | Status: DC

## 2016-02-02 NOTE — Progress Notes (Signed)
Subjective:    CC:   HPI: Diabetes - no hypoglycemic events. No wounds or sores that are not healing well. No increased thirst or urination. Checking glucose at home. Taking medications as prescribed without any side effects.  Hypertension- Pt denies chest pain, SOB, dizziness, or heart palpitations.  Taking meds as directed w/o problems.  Denies medication side effects.    Has 2 spots on her back she wants me to look at today. She says they have been there for a while are starting to get bothersome and irritating.   Past medical history, Surgical history, Family history not pertinant except as noted below, Social history, Allergies, and medications have been entered into the medical record, reviewed, and corrections made.   Review of Systems: No fevers, chills, night sweats, weight loss, chest pain, or shortness of breath.   Objective:    General: Well Developed, well nourished, and in no acute distress.  Neuro: Alert and oriented x3, extra-ocular muscles intact, sensation grossly intact.  HEENT: Normocephalic, atraumatic  Skin: Warm and dry, no rashes. In her mid upper back between the shoulder blades she has a large approximately 3 cm sebaceous cyst. No sign of acute irritation or inflammation. She has a second, smaller partly 1 cm cyst on the left mid back underneath the bra strap area. Cardiac: Regular rate and rhythm, no murmurs rubs or gallops, no lower extremity edema.  Respiratory: Clear to auscultation bilaterally. Not using accessory muscles, speaking in full sentences.   Impression and Recommendations:   DM-  hemoglobin A1c of 5.3 today looks fantastic. Into new current regimen. Follow-up in 3-4 months. Due for BMP.  REminded due for eye exam.  Sebaceous cyst-refer to general surgery for excision.  HTN - not well controlled today. Back in March looked absolutely fantastic in fact she is actually lost about 5 pounds since then some not sure why it so elevated today. Will have  her come back in 1-2 weeks to recheck before we adjust her medication regimen.  Due for screening mammogram. Will place order and have them call her to schedule.  Also due for Pap smear. Reminded her to schedule a physical with Pap smear in the next coming months.

## 2016-02-02 NOTE — Telephone Encounter (Signed)
Call pt: Since her repeat blood pressure was still high would like her come back in 1-2 weeks to recheck it before we adjust her medication. When she was here in March of looked great some not sure why it was so high today.

## 2016-02-03 NOTE — Progress Notes (Signed)
All labs are normal. 

## 2016-02-03 NOTE — Telephone Encounter (Signed)
Pt informed of recommendations to come in for bp check.Samantha Price [name]  Pt wanted to know if the mammogram can be done here in [place]. I told her that I would change the order. She also wanted to know if there was a closer general surgeon that she could go to I told her there was not. And to inform them of her work schedule. She voiced understanding and agreed.Samantha Price [place]

## 2016-02-04 ENCOUNTER — Ambulatory Visit: Payer: 59 | Admitting: Family Medicine

## 2016-03-05 ENCOUNTER — Other Ambulatory Visit: Payer: Self-pay | Admitting: Family Medicine

## 2016-03-08 ENCOUNTER — Other Ambulatory Visit: Payer: Self-pay | Admitting: Surgery

## 2016-04-05 ENCOUNTER — Other Ambulatory Visit: Payer: Self-pay | Admitting: Surgery

## 2016-05-26 ENCOUNTER — Encounter: Payer: Self-pay | Admitting: Family Medicine

## 2016-05-26 ENCOUNTER — Ambulatory Visit (INDEPENDENT_AMBULATORY_CARE_PROVIDER_SITE_OTHER): Payer: 59 | Admitting: Family Medicine

## 2016-05-26 ENCOUNTER — Ambulatory Visit (INDEPENDENT_AMBULATORY_CARE_PROVIDER_SITE_OTHER): Payer: 59

## 2016-05-26 VITALS — BP 122/78 | HR 58 | Ht 62.0 in | Wt 145.0 lb

## 2016-05-26 DIAGNOSIS — R221 Localized swelling, mass and lump, neck: Secondary | ICD-10-CM

## 2016-05-26 DIAGNOSIS — I10 Essential (primary) hypertension: Secondary | ICD-10-CM | POA: Diagnosis not present

## 2016-05-26 LAB — CBC WITH DIFFERENTIAL/PLATELET
BASOS PCT: 0 %
Basophils Absolute: 0 cells/uL (ref 0–200)
Eosinophils Absolute: 72 cells/uL (ref 15–500)
Eosinophils Relative: 1 %
HCT: 37.3 % (ref 35.0–45.0)
Hemoglobin: 12.1 g/dL (ref 11.7–15.5)
LYMPHS PCT: 31 %
Lymphs Abs: 2232 cells/uL (ref 850–3900)
MCH: 30.1 pg (ref 27.0–33.0)
MCHC: 32.4 g/dL (ref 32.0–36.0)
MCV: 92.8 fL (ref 80.0–100.0)
MONOS PCT: 6 %
MPV: 9.3 fL (ref 7.5–12.5)
Monocytes Absolute: 432 cells/uL (ref 200–950)
Neutro Abs: 4464 cells/uL (ref 1500–7800)
Neutrophils Relative %: 62 %
Platelets: 336 10*3/uL (ref 140–400)
RBC: 4.02 MIL/uL (ref 3.80–5.10)
RDW: 12.9 % (ref 11.0–15.0)
WBC: 7.2 10*3/uL (ref 3.8–10.8)

## 2016-05-26 NOTE — Progress Notes (Signed)
Subjective:    Patient ID: Samantha Price, female    DOB: March 20, 1961, 55 y.o.   MRN: [id number]  HPI 55 you female c/o lump on the back of her neck. Noticed it last week right before Thanksgiving.  Says it is tender to press on it but otherwise doesn't bother her. No recent trauma or illness. No fever.     She did recently have 2 sebaceous cysts removed from her upper back. And she has a previous history of lipoma on her right anterior thigh that was removed. She feels like it's actually returning. She had Dr. Maureen Ralphs remove it.  HTN - Blood pressure was initially very high when she got here today but she was feeling a little anxious. He says her home blood pressures have been well controlled.   Review of Systems  BP (!) 184/57   Pulse (!) 58   Ht 5\' 2"  (1.575 m)   Wt 145 lb (65.8 kg)   LMP 06/28/2011   BMI 26.52 kg/m     Allergies  Allergen Reactions  . Latex Rash    Past Medical History:  Diagnosis Date  . Anemia   . Cyst    drained from Rt breast  . Diabetes mellitus    type 2  . Fibromyalgia   . Herniated disc   . Migraines   . Wears glasses     Past Surgical History:  Procedure Laterality Date  . APPENDECTOMY    . breast cyst excison     Left,   . COLONOSCOPY W/ POLYPECTOMY     Oct 20, 2005, Dr. Oletta Lamas.    . TUBAL LIGATION      Social History   Social History  . Marital status: Widowed    Spouse name: Annie Main   . Number of children: 3  . Years of education: BA   Occupational History  . 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 Smoking status: Never Smoker  . Smokeless tobacco: Not on file  . Alcohol use Yes  . Drug use:   . Sexual activity: Not on file     Comment: widow, regularly exercises   Other Topics Concern  . Not on file   Social History Narrative   Her youngest daughter lives at home. Has 3 daughters.  Her husband committed suicide in 10-20-08 from PTSD, and mother died 24 months prior  to that.  Mom died in a tragik accident.     Family History  Problem Relation Age of Onset  . Cancer Father     stomach  . Diabetes Mother   . Colon cancer Other     aunt, cousin  . Heart disease Other     family hisatory  . Leukemia      1st cousin  . Breast cancer      aunt  . Stroke      Grandparent    Outpatient Encounter Prescriptions as of 05/26/2016  Medication Sig  . AMBULATORY NON FORMULARY MEDICATION Medication Name: Glucometer with strips and lancets to test 2-3 time per week.  Marland Kitchen aspirin 81 MG tablet Take 81 mg by mouth daily.    . Ginkgo Biloba 40 MG TABS Take 40 mg by mouth daily.    . Linagliptin-Metformin HCl (JENTADUETO) 2.10-998 MG TABS Take 1 tablet by mouth 2 (two) times daily.  Marland Kitchen lisinopril-hydrochlorothiazide (PRINZIDE,ZESTORETIC) 20-12.5 MG tablet TAKE ONE TABLET BY MOUTH TWICE DAILY.  . Multiple Vitamin (MULTIVITAMIN  PO) Take by mouth daily.    Marland Kitchen topiramate (TOPAMAX) 50 MG tablet Take 1 tablet (50 mg total) by mouth 2 (two) times daily.   No facility-administered encounter medications on file as of 05/26/2016.          Objective:   Physical Exam  Constitutional: She is oriented to person, place, and time. She appears well-developed and well-nourished.  HENT:  Head: Normocephalic and atraumatic.  Right Ear: External ear normal.  Left Ear: External ear normal.  Nose: Nose normal.  Mouth/Throat: Oropharynx is clear and moist.  TMs and canals are clear.   Eyes: Conjunctivae and EOM are normal. Pupils are equal, round, and reactive to light.  Neck: Neck supple. No thyromegaly present.  soft palpable round mobile nodule/cyst at the posterior mid cervical neck over the spine.  Cardiovascular: Normal rate.   Lymphadenopathy:    She has no cervical adenopathy.  Neurological: She is alert and oriented to person, place, and time.  Skin: Skin is warm and dry.  Psychiatric: She has a normal mood and affect.        Assessment & Plan:   Cyst  posterior neck - Check CBC and schedule for ultrasound for further evaluation. It does feel like a cyst. Not a sebaceous cyst. And doesn't quite feel like a lymph node. I like to characterize a little bit further.  Pretension-repeat blood pressure looks fantastic. We'll continue to monitor carefully.

## 2016-06-01 ENCOUNTER — Encounter: Payer: Self-pay | Admitting: Family Medicine

## 2016-06-01 ENCOUNTER — Ambulatory Visit (INDEPENDENT_AMBULATORY_CARE_PROVIDER_SITE_OTHER): Payer: 59 | Admitting: Family Medicine

## 2016-06-01 VITALS — BP 175/57 | HR 58 | Wt 145.0 lb

## 2016-06-01 DIAGNOSIS — Z8249 Family history of ischemic heart disease and other diseases of the circulatory system: Secondary | ICD-10-CM | POA: Diagnosis not present

## 2016-06-01 DIAGNOSIS — R221 Localized swelling, mass and lump, neck: Secondary | ICD-10-CM

## 2016-06-01 DIAGNOSIS — I10 Essential (primary) hypertension: Secondary | ICD-10-CM

## 2016-06-01 NOTE — Patient Instructions (Signed)
Thank you for coming in today. You should hear about the ultrasound of your heart soon. Try to get records of your brother's condition.  Follow up with Dr Jerilynn Mages about your blood pressure.   We will keep an eye on the bump on your neck.

## 2016-06-01 NOTE — Progress Notes (Signed)
Samantha Price is a 55 y.o. female who presents to [facility] today for discuss mass on posterior neck and family history of hypertrophic obstructive cardiomyopathy.  Mass on neck: Patient has a several week history of a mildly tender mass at the posterior neck. She was seen by her primary care provider for this issue on November 30. A soft tissue ultrasound was obtained which failed to show much of anything. She notes she continues to experience a mildly tender mass at the left of the midline of the posterior neck. She notes this is not increased in size or changed much. She feels really well with no fevers chills chest pain palpitations or shortness of breath. She denies any night sweats or weight loss.  Family history of hypertrophic obstructive cardiomyopathy.  In September the patient's brother had a witnessed cardiac arrest and received 20 minutes of CPR and defibrillation. He has had significant recovery and is doing pretty well. As part of his workup he was found to have (per patient report) hypertrophic obstructive cardiomyopathy. He's had genetic testing and has been determined that he has a genetic cause for this condition. There is recommendation that his family get tested. Samantha Price denies any history of significant chest pain palpitations or syncope.  She does have a related cardiovascular complaint of hypertension. She notes that she did not take her medicine this morning.   Past Medical History:  Diagnosis Date  . Anemia   . Cyst    drained from Rt breast  . Diabetes mellitus    type 2  . Fibromyalgia   . Herniated disc   . Migraines   . Wears glasses    Past Surgical History:  Procedure Laterality Date  . APPENDECTOMY    . breast cyst excison     Left,   . COLONOSCOPY W/ POLYPECTOMY     2007, Dr. Oletta Lamas.    . TUBAL LIGATION     Social  History  Substance Use Topics  . Smoking status: Never Smoker  . Smokeless tobacco: Not on file  . Alcohol use Yes   family history includes Cancer in her father; Colon cancer in her other; Diabetes in her mother; Heart disease in her other; Hypertrophic cardiomyopathy in her brother.  ROS as above:  Medications: Current Outpatient Prescriptions  Medication Sig Dispense Refill  . AMBULATORY NON FORMULARY MEDICATION Medication Name: Glucometer with strips and lancets to test 2-3 time per week. 1 Units PRN  . aspirin 81 MG tablet Take 81 mg by mouth daily.      . Ginkgo Biloba 40 MG TABS Take 40 mg by mouth daily.      . Linagliptin-Metformin HCl (JENTADUETO) 2.10-998 MG TABS Take 1 tablet by mouth 2 (two) times daily. 60 tablet 6  . lisinopril-hydrochlorothiazide (PRINZIDE,ZESTORETIC) 20-12.5 MG tablet TAKE ONE TABLET BY MOUTH TWICE DAILY. 60 tablet 6  . Multiple Vitamin (MULTIVITAMIN PO) Take by mouth daily.      Marland Kitchen topiramate (TOPAMAX) 50 MG tablet Take 1 tablet (50 mg total) by mouth 2 (two) times daily. 60 tablet 3   No current facility-administered medications for this visit.    Allergies  Allergen Reactions  . Latex Rash    Health Maintenance Health Maintenance  Topic Date Due  . PNEUMOCOCCAL POLYSACCHARIDE VACCINE (1) 05/17/1961  . PAP SMEAR  05/17/1980  . OPHTHALMOLOGY EXAM  02/25/2015  . MAMMOGRAM  03/13/2015  . INFLUENZA VACCINE  01/26/2016  .  HEMOGLOBIN A1C  08/04/2016  . FOOT EXAM  09/10/2016  . COLONOSCOPY  10/04/2016  . TETANUS/TDAP  11/25/2020  . Hepatitis C Screening  Completed  . HIV Screening  Completed     Exam:  BP (!) 175/57   Pulse (!) 58   Wt 145 lb (65.8 kg)   LMP 06/28/2011   BMI 26.52 kg/m  Gen: Well NAD HEENT: EOMI,  MMM Lungs: Normal work of breathing. CTABL Heart: Mild bradycardia regular rhythm no MRG Abd: NABS, Soft. Nondistended, Nontender Exts: Brisk capillary refill, warm and well perfused.  Skin posterior neck: Small midline  mildly tender less than 1 cm papular lesion palpated.  Careful soft tissue point-of-care ultrasound failed to show any cystic or solid lesions. The area of maximal tenderness is very close to a spinous process on ultrasound. Bony structures are normal appearing  Twelve-lead EKG shows sinus bradycardia at a rate of 54 bpm. EKG is otherwise normal and unremarkable appearing.   No results found for this or any previous visit (from the past 72 hour(s)). No results found.    Assessment and Plan: 55 y.o. female with  Posterior neck mass: Small and not changing. This is consistent with either a reactive lymph node or soft tissue myofascial nodule. Plan for watchful waiting. If symptoms persist or worsen would recommend MRI of soft tissue with contrast.  Family history of HOCM. EKG unremarkable today. Exam is unremarkable except for hypertension. Plan for echocardiogram. Will additionally obtain further records. Hopefully we'll get genetic testing results as well as detailed cardiac report. This will help [place] tell if patient if is at high risk.  Hypertension: Follow up with PCP. Recommended 1 month of home blood pressure measurements.   Orders Placed This Encounter  Procedures  . ECHOCARDIOGRAM COMPLETE    HTN and FHx of HOCM    Standing Status:   Future    Standing Expiration Date:   08/30/2017    Order Specific Question:   Where should this test be performed    Answer:   [facility]    Order Specific Question:   Complete or Limited study?    Answer:   Complete    Order Specific Question:   Does the patient have a known history of hypersensitivity to Perflutren (aka Scientist, research (medical) for echocardiograms - CHECK ALLERGIES)    Answer:   No    Order Specific Question:   ADMINISTER PERFLUTERN    Answer:   ADMINISTER PERFLUTREN    Order Specific Question:   Expected Date:    Answer:   1 month    Order Specific Question:   Reason for exam-Echo    Answer:   Other - See  Comments Section    Discussed warning signs or symptoms. Please see discharge instructions. Patient expresses understanding.

## 2016-06-01 NOTE — Addendum Note (Signed)
Addended by: Huel Cote on: 06/01/2016 03:44 PM   Modules accepted: Orders

## 2016-06-16 ENCOUNTER — Ambulatory Visit (HOSPITAL_BASED_OUTPATIENT_CLINIC_OR_DEPARTMENT_OTHER)
Admission: RE | Admit: 2016-06-16 | Discharge: 2016-06-16 | Disposition: A | Discharge status: Home / Self Care | Payer: 59 | Source: Ambulatory Visit | Attending: Family Medicine | Admitting: Family Medicine

## 2016-06-16 DIAGNOSIS — Z8249 Family history of ischemic heart disease and other diseases of the circulatory system: Secondary | ICD-10-CM | POA: Diagnosis not present

## 2016-06-16 DIAGNOSIS — E119 Type 2 diabetes mellitus without complications: Secondary | ICD-10-CM | POA: Insufficient documentation

## 2016-06-16 DIAGNOSIS — I119 Hypertensive heart disease without heart failure: Secondary | ICD-10-CM | POA: Diagnosis not present

## 2016-06-16 DIAGNOSIS — I10 Essential (primary) hypertension: Secondary | ICD-10-CM | POA: Diagnosis not present

## 2016-06-17 ENCOUNTER — Ambulatory Visit (INDEPENDENT_AMBULATORY_CARE_PROVIDER_SITE_OTHER): Payer: 59

## 2016-06-17 DIAGNOSIS — Z1231 Encounter for screening mammogram for malignant neoplasm of breast: Secondary | ICD-10-CM

## 2016-06-22 ENCOUNTER — Encounter: Payer: Self-pay | Admitting: Family Medicine

## 2016-06-22 DIAGNOSIS — I5189 Other ill-defined heart diseases: Secondary | ICD-10-CM | POA: Insufficient documentation

## 2016-06-22 IMAGING — CR DG CERVICAL SPINE COMPLETE 4+V
6 series · 6 of 6 positions shown · non-contrast
Comparison: 04/28/2006.

CLINICAL DATA: Neck pain.  Initial evaluation.

EXAM:
CERVICAL SPINE  4+ VIEWS

[c-spine lat]
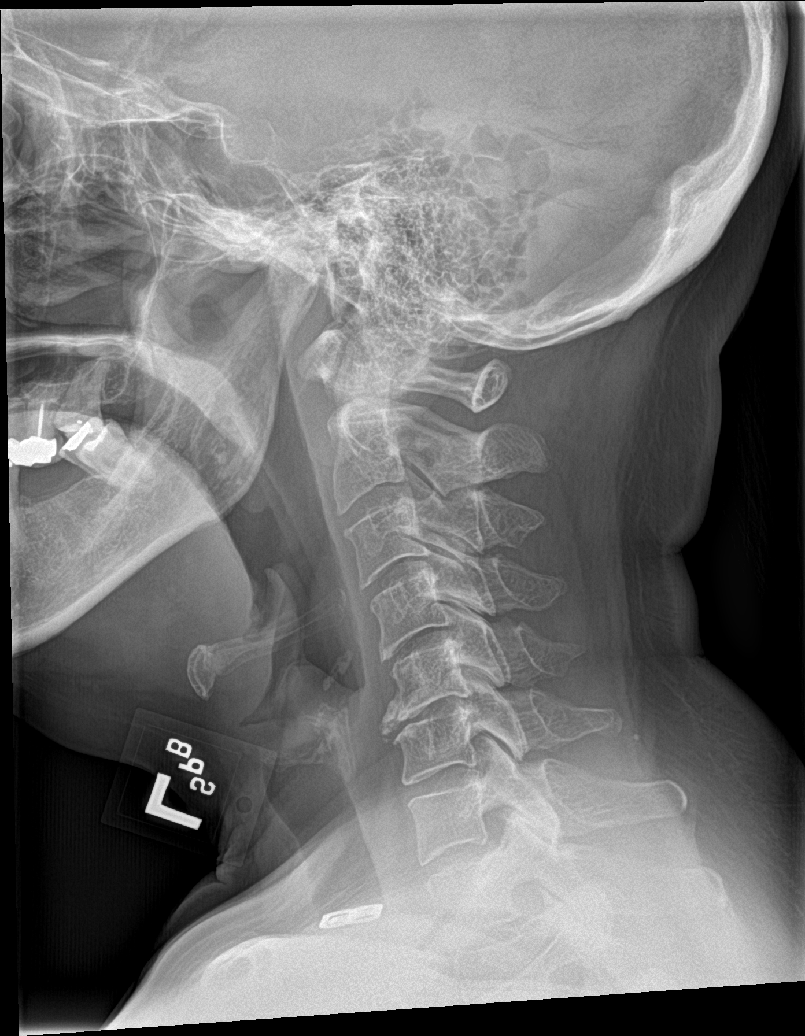

[c-spine obl (1 of 2)]
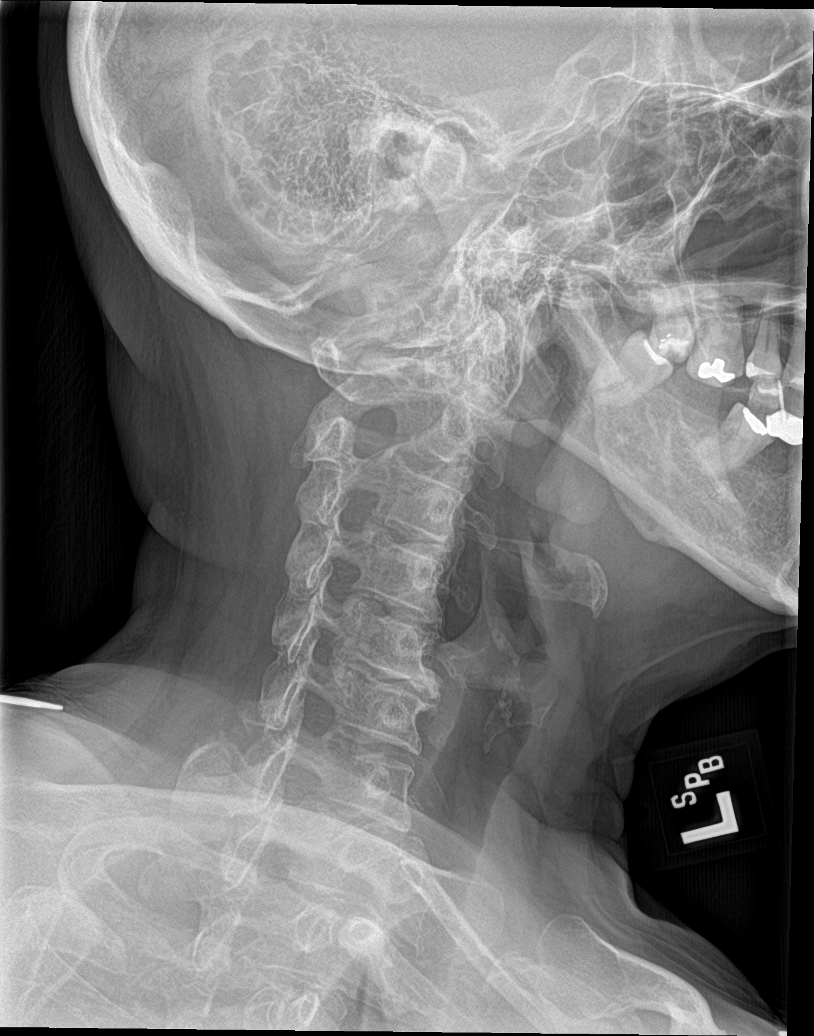

[c-spine obl (2 of 2)]
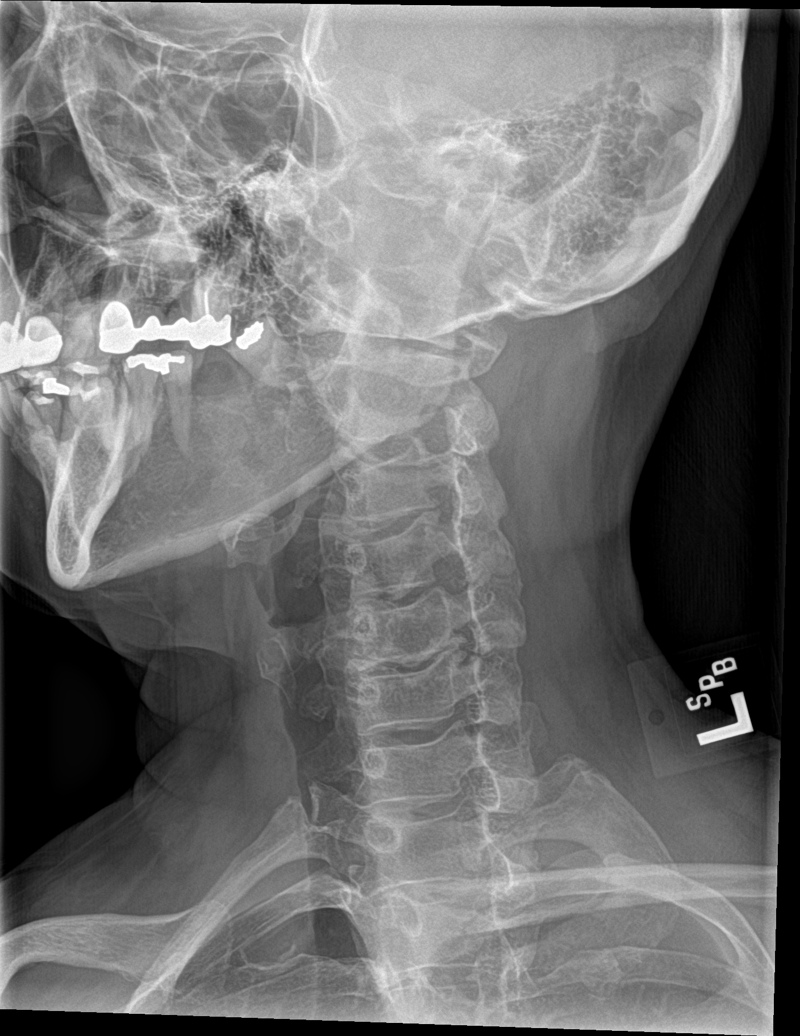

[c-spine ap]
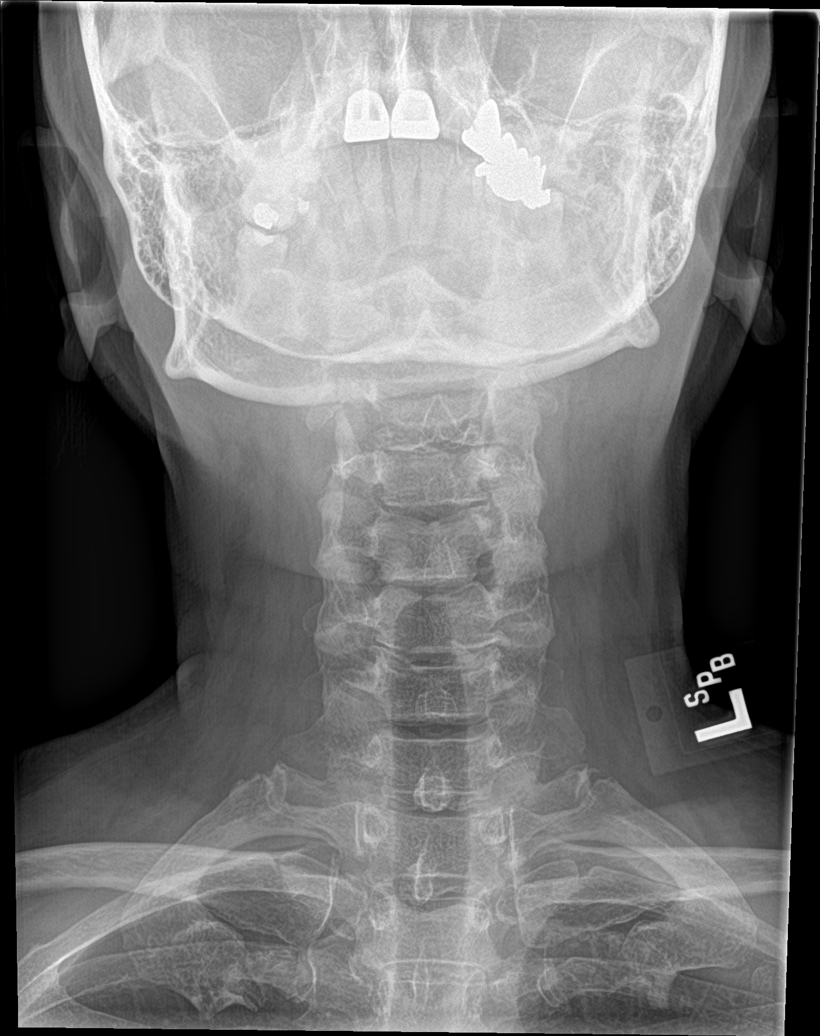

[c-spine open mouth]
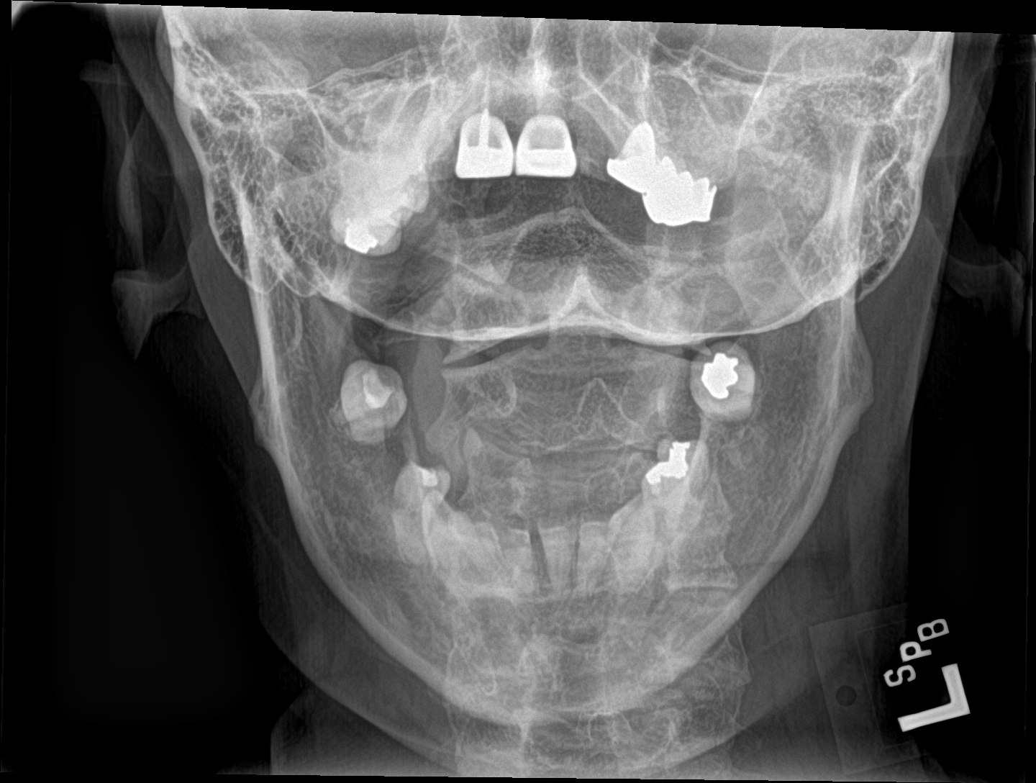

[[person_name]]
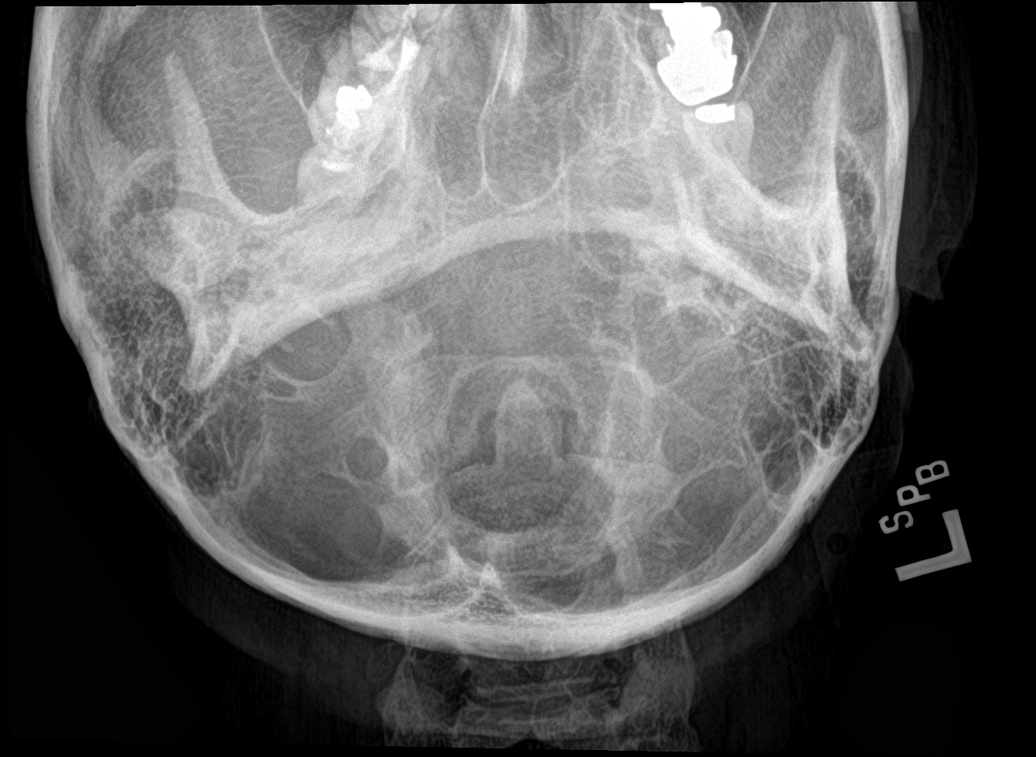

[6 of 6 positions shown; findings below may reference images not displayed]

FINDINGS: Straightening of cervical spine is noted. No evidence of fracture
dislocation. Degenerative changes noted diffusely, particularly
prominent at C4-C5 and C5-C6. Bilateral neural foraminal mild
moderate narrowing noted at these levels. Pulmonary apices are clear
.
IMPRESSION: Degenerative changes cervical spine, particular prominent C4-C5 and
C5-C6. No acute abnormality identified.

## 2016-08-22 ENCOUNTER — Ambulatory Visit (INDEPENDENT_AMBULATORY_CARE_PROVIDER_SITE_OTHER): Payer: 59 | Admitting: Family Medicine

## 2016-08-22 VITALS — BP 183/74 | HR 56 | Temp 97.7°F | Wt 154.0 lb

## 2016-08-22 DIAGNOSIS — J069 Acute upper respiratory infection, unspecified: Secondary | ICD-10-CM

## 2016-08-22 DIAGNOSIS — M7550 Bursitis of unspecified shoulder: Secondary | ICD-10-CM | POA: Diagnosis not present

## 2016-08-22 DIAGNOSIS — I10 Essential (primary) hypertension: Secondary | ICD-10-CM

## 2016-08-22 DIAGNOSIS — B9789 Other viral agents as the cause of diseases classified elsewhere: Secondary | ICD-10-CM | POA: Diagnosis not present

## 2016-08-22 DIAGNOSIS — I519 Heart disease, unspecified: Secondary | ICD-10-CM

## 2016-08-22 DIAGNOSIS — Z8249 Family history of ischemic heart disease and other diseases of the circulatory system: Secondary | ICD-10-CM

## 2016-08-22 DIAGNOSIS — I5189 Other ill-defined heart diseases: Secondary | ICD-10-CM

## 2016-08-22 MED ORDER — BENZONATATE 200 MG PO CAPS: 200.0000 mg | ORAL_CAPSULE | Freq: Three times a day (TID) | ORAL | 0 refills | Status: DC | PRN

## 2016-08-22 NOTE — Patient Instructions (Signed)
Thank you for coming in today. Restart shoulder exercises.  Use tessalon pearles for cough as needed.  Please check blood pressure at home and keep a log.   Bring you machine and the blood pressure log to the next visit.   Schedule a visit for 2 weeks with me for shoulder injection.  Cancel the appt if the pain improves with exercises.    Secondary Shoulder Impingement Syndrome Rehab Ask your health care provider which exercises are safe for you. Do exercises exactly as told by your health care provider and adjust them as directed. It is normal to feel mild stretching, pulling, tightness, or discomfort as you do these exercises, but you should stop right away if you feel sudden pain or your pain gets worse. Do not begin these exercises until told by your health care provider. Stretching and range of motion exercise This exercise warms up your muscles and joints and improves the movement and flexibility of your neck and shoulder. This exercise also helps to relieve pain and stiffness. Exercise A: Cervical side bend 1. Using good posture, sit on a stable chair, or stand up. 2. Without moving your shoulders, slowly tilt your left / right ear toward your left / right shoulder until you feel a stretch in your neck muscles. You should be looking straight ahead. 3. Hold for __________ seconds. 4. Slowly return to the starting position. 5. Repeat on your left / right side. Repeat __________ times. Complete this exercise __________ times a day. Strengthening exercises These exercises build strength and endurance in your shoulder. Endurance is the ability to use your muscles for a long time, even after they get tired. Exercise B: Scapular protraction, supine 1. Lie on your back on a firm surface. Hold a __________ weight in your left / right hand. 2. Raise your left / right arm straight into the air so your hand is directly above your shoulder joint. 3. Push the weight into the air so your shoulder  lifts off of the surface that you are lying on. Do not move your head, neck, or back. 4. Hold for __________ seconds. 5. Slowly return to the starting position. Let your muscles relax completely before you repeat this exercise. Repeat __________ times. Complete this exercise __________ times a day. Exercise C: Scapular retraction 1. Sit in a stable chair without armrests, or stand. 2. Secure an exercise band to a stable object in front of you so the band is at shoulder height. 3. Hold one end of the exercise band in each hand. Your palms should face down. 4. Squeeze your shoulder blades together and move your elbows slightly behind you. Do not shrug your shoulders while you do this. 5. Hold for __________ seconds. 6. Slowly return to the starting position. Repeat __________ times. Complete this exercise __________ times a day. Exercise D: Shoulder extension with scapular retraction 1. Sit in a stable chair without armrests, or stand. 2. Secure an exercise band to a stable object in front of you where it is above shoulder height. 3. Hold one end of the exercise band in each hand. 4. Straighten your elbows and lift your hands up to shoulder height. 5. Squeeze your shoulder blades together and pull your hands down to the sides of your thighs. Stop when your hands are straight down by your sides. Do not let your hands go behind your body. 6. Hold for __________ seconds. 7. Slowly return to the starting position. Repeat __________ times. Complete this exercise __________ times a  day. Exercise E: Shoulder abduction 1. Sit in a stable chair without armrests, or stand. 2. If directed, hold a __________ weight in your left / right hand. 3. Start with your arms straight down. Turn your left / right hand so your palm faces in, toward your body. 4. Slowly lift your left / right hand out to your side. Do not lift your hand above shoulder height.  Keep your arms straight.  Avoid shrugging your  shoulder while you do this movement. Keep your shoulder blade tucked down toward the middle of your back. 5. Hold for __________ seconds. 6. Slowly lower your arm, and return to the starting position. Repeat __________ times. Complete this exercise __________ times a day. This information is not intended to replace advice given to you by your health care provider. Make sure you discuss any questions you have with your health care provider. Document Released: 06/13/2005 Document Revised: 02/18/2016 Document Reviewed: 05/16/2015 Elsevier Interactive Patient Education  2017 [employer].

## 2016-08-22 NOTE — Progress Notes (Signed)
Samantha Price is a 56 y.o. female who presents to [place]: [place] today for cough congestion and runny nose. Symptoms present for about 8 days. She denies fevers chills vomiting or diarrhea. She denies significant shortness of breath. She notes cough is nonproductive and does not interfere with sleep. She's been using over-the-counter medications which helps some.  Right shoulder: Patient notes continued right shoulder pain. She was last seen for this problem in June 2017 when she received a subacromial bursa injection. The shoulder pain she's been doing with intermittently was thought to be multifactorial related to subacromial bursitis/rotator cuff tendinopathy, acromioclavicular DJD, and some cervical radiculopathy. She had been religiously doing her home exercise program for months until recently. She was focused on her daughter's wedding and notes that she had stopped doing her exercises. She notes the pain started back around the same time. She denies any repeat injury. Pain is located in the right upper arm worse with overhead motion reaching back and at night.   Past Medical History:  Diagnosis Date  . Anemia   . Cyst    drained from Rt breast  . Diabetes mellitus    type 2  . Fibromyalgia   . Herniated disc   . Migraines   . Wears glasses    Past Surgical History:  Procedure Laterality Date  . APPENDECTOMY    . breast cyst excison     Left,   . COLONOSCOPY W/ POLYPECTOMY     2007, Dr. Oletta Lamas.    . TUBAL LIGATION     Social History  Substance Use Topics  . Smoking status: Never Smoker  . Smokeless tobacco: Not on file  . Alcohol use Yes   family history includes Cancer in her father; Colon cancer in her other; Diabetes in her mother; Heart disease in her other; Hypertrophic cardiomyopathy in her brother.  ROS as above:  Medications: Current  Outpatient Prescriptions  Medication Sig Dispense Refill  . AMBULATORY NON FORMULARY MEDICATION Medication Name: Glucometer with strips and lancets to test 2-3 time per week. 1 Units PRN  . aspirin 81 MG tablet Take 81 mg by mouth daily.      . Ginkgo Biloba 40 MG TABS Take 40 mg by mouth daily.      . Linagliptin-Metformin HCl (JENTADUETO) 2.10-998 MG TABS Take 1 tablet by mouth 2 (two) times daily. 60 tablet 6  . lisinopril-hydrochlorothiazide (PRINZIDE,ZESTORETIC) 20-12.5 MG tablet TAKE ONE TABLET BY MOUTH TWICE DAILY. 60 tablet 6  . Multiple Vitamin (MULTIVITAMIN PO) Take by mouth daily.      Marland Kitchen topiramate (TOPAMAX) 50 MG tablet Take 1 tablet (50 mg total) by mouth 2 (two) times daily. 60 tablet 3  . benzonatate (TESSALON) 200 MG capsule Take 1 capsule (200 mg total) by mouth 3 (three) times daily as needed for cough. 45 capsule 0   No current facility-administered medications for this visit.    Allergies  Allergen Reactions  . Latex Rash    Health Maintenance Health Maintenance  Topic Date Due  . PNEUMOCOCCAL POLYSACCHARIDE VACCINE (1) 05/17/1961  . PAP SMEAR  05/17/1980  . OPHTHALMOLOGY EXAM  02/25/2015  . HEMOGLOBIN A1C  08/04/2016  . INFLUENZA VACCINE  02/28/2017 (Originally 01/26/2016)  . FOOT EXAM  09/10/2016  . COLONOSCOPY  10/04/2016  . MAMMOGRAM  06/17/2018  . TETANUS/TDAP  11/25/2020  . Hepatitis C Screening  Completed  . HIV Screening  Completed  Exam:  BP (!) 183/74   Pulse (!) 56   Temp 97.7 F (36.5 C) (Oral)   Wt 154 lb (69.9 kg)   LMP 06/28/2011   SpO2 100%   BMI 28.17 kg/m  Gen: Well NAD HEENT: EOMI,  MMM Lungs: Normal work of breathing. CTABL Heart: RRR no Slight nonradiating murmur is present Abd: NABS, Soft. Nondistended, Nontender Exts: Brisk capillary refill, warm and well perfused.  MSK: Right shoulder normal-appearing nontender. Abduction painful arc limited abduction to 120. Normal and internal and external motion. Positive Hawkins  and Neer's test. Positive empty can test.  Study Result   CLINICAL DATA:  Decreased range of motion.  Weakness.  EXAM: MRI OF THE RIGHT SHOULDER WITHOUT CONTRAST  TECHNIQUE: Multiplanar, multisequence MR imaging of the shoulder was performed. No intravenous contrast was administered.  COMPARISON:  None.  FINDINGS: Rotator cuff: Mild tendinosis of the supraspinatus and infraspinatus tendons with mild fraying along the bursal surface of the supraspinatus tendon 1.5 cm from the insertion. Teres minor tendon is intact. Subscapularis tendon is intact.  Muscles: No atrophy or fatty replacement of nor abnormal signal within, the muscles of the rotator cuff.  Biceps long head:  Intact.  Acromioclavicular Joint: Mild degenerative changes of the acromioclavicular joint. Type II acromion. Trace subacromial/subdeltoid bursal fluid.  Glenohumeral Joint: No joint effusion.  No chondral defect.  Labrum: Grossly intact, but evaluation is limited by lack of intraarticular fluid.  Bones:  No marrow signal abnormality.  No fracture or dislocation.  IMPRESSION: 1. Mild tendinosis of the supraspinatus and infraspinatus tendons with mild fraying along the bursal surface of the supraspinatus tendon 1.5 cm from the insertion. 2. Mild subacromial/subdeltoid bursitis.   Electronically Signed   By: Kathreen Devoid   On: 07/06/2015 16:40     cc:  ------------------------------------------------------------------- LV EF: 60% -   65%  ------------------------------------------------------------------- History:   PMH:  Family history of hypertrophic cardiomyopathy. Risk factors:  Hypertension. Diabetes mellitus.  ------------------------------------------------------------------- Study Conclusions  - Left ventricle: The cavity size was normal. There was moderate   concentric hypertrophy. Systolic function was normal. The   estimated ejection fraction was in the range  of 60% to 65%. Wall   motion was normal; there were no regional wall motion   abnormalities. Features are consistent with a pseudonormal left   ventricular filling pattern, with concomitant abnormal relaxation   and increased filling pressure (grade 2 diastolic dysfunction).   Doppler parameters are consistent with indeterminate ventricular   filling pressure. - Aortic valve: There was no regurgitation. - Mitral valve: Transvalvular velocity was within the normal range.   There was no evidence for stenosis. There was no regurgitation. - Right ventricle: The cavity size was normal. Wall thickness was   normal. Systolic function was normal. - Atrial septum: No defect or patent foramen ovale was identified. - Tricuspid valve: There was trivial regurgitation. - Pulmonary arteries: Systolic pressure was within the normal   range. PA peak pressure: 25 mm Hg (S).    No results found for this or any previous visit (from the past 72 hour(s)). No results found.    Assessment and Plan: 56 y.o. female with  Cough: Viral URI.  Patient continues to have a bit of a cough. Plan to treat with Tessalon Perles and over-the-counter medications along with some watchful waiting.  Right shoulder pain: Likely rotator cuff tendinitis versus subacromial bursitis. Patient has stopped her home exercises and the pain has returned. Plan for a short trial of home  exercise programs if not better in 2 weeks will proceed with injection. Like to avoid injection today because she is ill.  Hypertension: Remains elevated. Recommend patient keep home blood pressure log and bring in to her next visit.  Murmur: Likely related to elevated blood pressure and anxiety. Recent echocardiogram showed diastolic dysfunction but otherwise was normal.   No orders of the defined types were placed in this encounter.  Meds ordered this encounter  Medications  . benzonatate (TESSALON) 200 MG capsule    Sig: Take 1 capsule (200 mg  total) by mouth 3 (three) times daily as needed for cough.    Dispense:  45 capsule    Refill:  0     Discussed warning signs or symptoms. Please see discharge instructions. Patient expresses understanding.

## 2016-09-08 ENCOUNTER — Ambulatory Visit: Payer: 59 | Admitting: Family Medicine

## 2016-09-15 ENCOUNTER — Ambulatory Visit (INDEPENDENT_AMBULATORY_CARE_PROVIDER_SITE_OTHER): Payer: 59 | Admitting: Family Medicine

## 2016-09-15 ENCOUNTER — Encounter: Payer: Self-pay | Admitting: Family Medicine

## 2016-09-15 VITALS — BP 182/75 | HR 60

## 2016-09-15 DIAGNOSIS — M7550 Bursitis of unspecified shoulder: Secondary | ICD-10-CM

## 2016-09-15 DIAGNOSIS — H8110 Benign paroxysmal vertigo, unspecified ear: Secondary | ICD-10-CM | POA: Insufficient documentation

## 2016-09-15 DIAGNOSIS — I10 Essential (primary) hypertension: Secondary | ICD-10-CM

## 2016-09-15 DIAGNOSIS — H8111 Benign paroxysmal vertigo, right ear: Secondary | ICD-10-CM | POA: Diagnosis not present

## 2016-09-15 MED ORDER — MECLIZINE HCL 25 MG PO TABS: 25.0000 mg | ORAL_TABLET | Freq: Three times a day (TID) | ORAL | 3 refills | Status: DC | PRN

## 2016-09-15 MED ORDER — AMLODIPINE BESYLATE 10 MG PO TABS: 10.0000 mg | ORAL_TABLET | Freq: Every day | ORAL | 0 refills | Status: DC

## 2016-09-15 MED ORDER — LISINOPRIL-HYDROCHLOROTHIAZIDE 20-25 MG PO TABS: 1.0000 | ORAL_TABLET | Freq: Every day | ORAL | 0 refills | Status: DC

## 2016-09-15 MED ORDER — LISINOPRIL-HYDROCHLOROTHIAZIDE 20-12.5 MG PO TABS: ORAL_TABLET | ORAL | 6 refills | Status: DC

## 2016-09-15 NOTE — Patient Instructions (Addendum)
Thank you for coming in today. Call or go to the ER if you develop a large red swollen joint with extreme pain or oozing puss.   Continue exercises for shoulder.   For high blood pressure continue Lisinopril/HCTZ.  Add amlodipine.   For Dizziness research  "Epley maneuver" for the right ear.  You should hear about the Vestibular PT folks for vertigo.  You can also take meclizine for dizziness as needed. This medicine will make you tired.   Follow up with Dr. Madilyn Fireman in 1 month to go over blood pressure.    Benign Positional Vertigo Vertigo is the feeling that you or your surroundings are moving when they are not. Benign positional vertigo is the most common form of vertigo. The cause of this condition is not serious (is benign). This condition is triggered by certain movements and positions (is positional). This condition can be dangerous if it occurs while you are doing something that could endanger you or others, such as driving. What are the causes? In many cases, the cause of this condition is not known. It may be caused by a disturbance in an area of the inner ear that helps your brain to sense movement and balance. This disturbance can be caused by a viral infection (labyrinthitis), head injury, or repetitive motion. What increases the risk? This condition is more likely to develop in:  Women.  People who are 75 years of age or older. What are the signs or symptoms? Symptoms of this condition usually happen when you move your head or your eyes in different directions. Symptoms may start suddenly, and they usually last for less than a minute. Symptoms may include:  Loss of balance and falling.  Feeling like you are spinning or moving.  Feeling like your surroundings are spinning or moving.  Nausea and vomiting.  Blurred vision.  Dizziness.  Involuntary eye movement (nystagmus). Symptoms can be mild and cause only slight annoyance, or they can be severe and interfere  with daily life. Episodes of benign positional vertigo may return (recur) over time, and they may be triggered by certain movements. Symptoms may improve over time. How is this diagnosed? This condition is usually diagnosed by medical history and a physical exam of the head, neck, and ears. You may be referred to a health care provider who specializes in ear, nose, and throat (ENT) problems (otolaryngologist) or a provider who specializes in disorders of the nervous system (neurologist). You may have additional testing, including:  MRI.  A CT scan.  Eye movement tests. Your health care provider may ask you to change positions quickly while he or she watches you for symptoms of benign positional vertigo, such as nystagmus. Eye movement may be tested with an electronystagmogram (ENG), caloric stimulation, the Dix-Hallpike test, or the roll test.  An electroencephalogram (EEG). This records electrical activity in your brain.  Hearing tests. How is this treated? Usually, your health care provider will treat this by moving your head in specific positions to adjust your inner ear back to normal. Surgery may be needed in severe cases, but this is rare. In some cases, benign positional vertigo may resolve on its own in 2-4 weeks. Follow these instructions at home: Safety   Move slowly.Avoid sudden body or head movements.  Avoid driving.  Avoid operating heavy machinery.  Avoid doing any tasks that would be dangerous to you or others if a vertigo episode would occur.  If you have trouble walking or keeping your balance, try using  a cane for stability. If you feel dizzy or unstable, sit down right away.  Return to your normal activities as told by your health care provider. Ask your health care provider what activities are safe for you. General instructions   Take over-the-counter and prescription medicines only as told by your health care provider.  Avoid certain positions or movements as  told by your health care provider.  Drink enough fluid to keep your urine clear or pale yellow.  Keep all follow-up visits as told by your health care provider. This is important. Contact a health care provider if:  You have a fever.  Your condition gets worse or you develop new symptoms.  Your family or friends notice any behavioral changes.  Your nausea or vomiting gets worse.  You have numbness or a "pins and needles" sensation. Get help right away if:  You have difficulty speaking or moving.  You are always dizzy.  You faint.  You develop severe headaches.  You have weakness in your legs or arms.  You have changes in your hearing or vision.  You develop a stiff neck.  You develop sensitivity to light. This information is not intended to replace advice given to you by your health care provider. Make sure you discuss any questions you have with your health care provider. Document Released: 03/21/2006 Document Revised: 11/19/2015 Document Reviewed: 10/06/2014 Elsevier Interactive Patient Education  2017 [employer].

## 2016-09-16 NOTE — Progress Notes (Signed)
Samantha Price is a 56 y.o. female who presents to [place]: [place] today for follow-up right shoulder pain, discussed vertigo and hypertension.  Right shoulder pain: Patient has been seen several times for right-sided shoulder pain thought to be due to a combination of subacromial bursitis and cervical radiculopathy. She notes pain in her lateral upper arm worse with overhead motion and reaching back. She denies any radiating pain. She notes the pain is consistent with previous subacromial bursitis pain. She last had an injection in June and notes the pain has been returning over the last few months and wants to proceed with an injection today. She has continued her home exercise programs. Ex  Vertigo: Patient has a history of intermittent vertigo however over the last few weeks she's had a returning symptoms of dizziness that she describes as room spinning. This occurs for a few seconds with head motion. She notes the last few days since been a little bit worse. She denies any weakness or numbness or loss of function.  Hypertension: Patient has a history of hypertension previously. She currently takes lisinopril/hydrochlorothiazide 20/12.5 twice daily. She denies chest pain palpitations or shortness of breath.   Past Medical History:  Diagnosis Date  . Anemia   . Cyst    drained from Rt breast  . Diabetes mellitus    type 2  . Fibromyalgia   . Herniated disc   . Migraines   . Wears glasses    Past Surgical History:  Procedure Laterality Date  . APPENDECTOMY    . breast cyst excison     Left,   . COLONOSCOPY W/ POLYPECTOMY     2007, Dr. Oletta Lamas.    . TUBAL LIGATION     Social History  Substance Use Topics  . Smoking status: Never Smoker  . Smokeless tobacco: Never Used  . Alcohol use Yes   family history includes Cancer in her father; Colon cancer in her  other; Diabetes in her mother; Heart disease in her other; Hypertrophic cardiomyopathy in her brother.  ROS as above:  Medications: Current Outpatient Prescriptions  Medication Sig Dispense Refill  . AMBULATORY NON FORMULARY MEDICATION Medication Name: Glucometer with strips and lancets to test 2-3 time per week. 1 Units PRN  . amLODipine (NORVASC) 10 MG tablet Take 1 tablet (10 mg total) by mouth daily. 90 tablet 0  . aspirin 81 MG tablet Take 81 mg by mouth daily.      . Ginkgo Biloba 40 MG TABS Take 40 mg by mouth daily.      . Linagliptin-Metformin HCl (JENTADUETO) 2.10-998 MG TABS Take 1 tablet by mouth 2 (two) times daily. 60 tablet 6  . lisinopril-hydrochlorothiazide (PRINZIDE,ZESTORETIC) 20-12.5 MG tablet TAKE ONE TABLET BY MOUTH TWICE DAILY. 60 tablet 6  . meclizine (ANTIVERT) 25 MG tablet Take 1 tablet (25 mg total) by mouth 3 (three) times daily as needed for dizziness or nausea. 30 tablet 3  . Multiple Vitamin (MULTIVITAMIN PO) Take by mouth daily.      Marland Kitchen topiramate (TOPAMAX) 50 MG tablet Take 1 tablet (50 mg total) by mouth 2 (two) times daily. 60 tablet 3   No current facility-administered medications for this visit.    Allergies  Allergen Reactions  . Latex Rash    Health Maintenance Health Maintenance  Topic Date Due  . PNEUMOCOCCAL POLYSACCHARIDE VACCINE (1) 05/17/1961  . PAP SMEAR  05/17/1980  . OPHTHALMOLOGY EXAM  02/25/2015  .  HEMOGLOBIN A1C  08/04/2016  . FOOT EXAM  09/10/2016  . INFLUENZA VACCINE  02/28/2017 (Originally 01/26/2016)  . COLONOSCOPY  10/04/2016  . MAMMOGRAM  06/17/2018  . TETANUS/TDAP  11/25/2020  . Hepatitis C Screening  Completed  . HIV Screening  Completed     Exam:  BP (!) 182/75   Pulse 60   LMP 06/28/2011   SpO2 99%  Gen: Well NAD HEENT: EOMI,  MMM Lungs: Normal work of breathing. CTABL Heart: RRR no MRG Abd: NABS, Soft. Nondistended, Nontender Exts: Brisk capillary refill, warm and well perfused.  MSK: Right shoulder  normal-appearing nontender. Normal motion however patient has pain with abduction. Positive Hawkins and Neer's test normal strength. Normal oriented. Positive right-sided Dix-Hallpike test. Normal balance otherwise.  Procedure: Real-time Ultrasound Guided Injection of right subacromial space  Device: GE Logiq E  Images permanently stored and available for review in the ultrasound unit. Verbal informed consent obtained. Discussed risks and benefits of procedure. Warned about infection bleeding damage to structures skin hypopigmentation and fat atrophy among others. Patient expresses understanding and agreement Time-out conducted.  Noted no overlying erythema, induration, or other signs of local infection.  Skin prepped in a sterile fashion.  Local anesthesia: Topical Ethyl chloride.  With sterile technique and under real time ultrasound guidance: 40mg  kenalog and 22ml marcaine injected easily.  Completed without difficulty  Pain immediately resolved suggesting accurate placement of the medication.  Advised to call if fevers/chills, erythema, induration, drainage, or persistent bleeding.  Images permanently stored and available for review in the ultrasound unit.  Impression: Technically successful ultrasound guided injection.     No results found for this or any previous visit (from the past 72 hour(s)). No results found.    Assessment and Plan: 56 y.o. female with  Right shoulder pain likely subacromial bursitis. Continue home exercise program. Status post injection today. Recheck in about a month.  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 Plan to refer to vestibular physical therapy as well as treatment with meclizine.  Hypertension not well-controlled. Patient already has what seems like maximum ACE inhibitor diuretic therapy. We'll add amlodipine and recheck in a month or so with myself or her primary care provider  Dr Madilyn Fireman   Orders Placed This Encounter  Procedures  . Ambulatory referral to Physical Therapy    Referral Priority:   Routine    Referral Type:   Physical Medicine    Referral Reason:   Specialty Services Required    Requested Specialty:   Physical Therapy    Number of Visits Requested:   1   Meds ordered this encounter  Medications  . DISCONTD: lisinopril-hydrochlorothiazide (PRINZIDE,ZESTORETIC) 20-25 MG tablet    Sig: Take 1 tablet by mouth daily.    Dispense:  90 tablet    Refill:  0  . meclizine (ANTIVERT) 25 MG tablet    Sig: Take 1 tablet (25 mg total) by mouth 3 (three) times daily as needed for dizziness or nausea.    Dispense:  30 tablet    Refill:  3  . lisinopril-hydrochlorothiazide (PRINZIDE,ZESTORETIC) 20-12.5 MG tablet    Sig: TAKE ONE TABLET BY MOUTH TWICE DAILY.    Dispense:  60 tablet    Refill:  6  . amLODipine (NORVASC) 10 MG tablet    Sig: Take 1 tablet (10 mg total) by mouth daily.    Dispense:  90 tablet    Refill:  0     Discussed warning  signs or symptoms. Please see discharge instructions. Patient expresses understanding.  I spent 40 minutes with this patient, greater than 50% was face-to-face time counseling regarding the above diagnosis.  METHENEY,CATHERINE, MD

## 2016-12-12 ENCOUNTER — Other Ambulatory Visit: Payer: Self-pay | Admitting: Family Medicine

## 2017-02-24 ENCOUNTER — Other Ambulatory Visit: Payer: Self-pay | Admitting: Family Medicine

## 2017-04-20 ENCOUNTER — Encounter: Payer: Self-pay | Admitting: Family Medicine

## 2017-04-20 ENCOUNTER — Ambulatory Visit (INDEPENDENT_AMBULATORY_CARE_PROVIDER_SITE_OTHER): Payer: 59

## 2017-04-20 ENCOUNTER — Ambulatory Visit (INDEPENDENT_AMBULATORY_CARE_PROVIDER_SITE_OTHER): Payer: 59 | Admitting: Family Medicine

## 2017-04-20 VITALS — BP 189/99 | HR 60 | Temp 97.6°F | Wt 159.0 lb

## 2017-04-20 DIAGNOSIS — M79642 Pain in left hand: Secondary | ICD-10-CM | POA: Diagnosis not present

## 2017-04-20 DIAGNOSIS — M67442 Ganglion, left hand: Secondary | ICD-10-CM

## 2017-04-20 DIAGNOSIS — M25542 Pain in joints of left hand: Secondary | ICD-10-CM | POA: Diagnosis not present

## 2017-04-20 DIAGNOSIS — M19032 Primary osteoarthritis, left wrist: Secondary | ICD-10-CM | POA: Diagnosis not present

## 2017-04-20 DIAGNOSIS — J069 Acute upper respiratory infection, unspecified: Secondary | ICD-10-CM

## 2017-04-20 MED ORDER — BENZONATATE 200 MG PO CAPS: 200.0000 mg | ORAL_CAPSULE | Freq: Three times a day (TID) | ORAL | 0 refills | Status: DC | PRN

## 2017-04-20 MED ORDER — DICLOFENAC SODIUM 1 % TD GEL: 2.0000 g | Freq: Four times a day (QID) | TRANSDERMAL | 11 refills | Status: DC

## 2017-04-20 MED ORDER — GUAIFENESIN-CODEINE 100-10 MG/5ML PO SOLN: 5.0000 mL | Freq: Four times a day (QID) | ORAL | 0 refills | Status: DC | PRN

## 2017-04-20 NOTE — Patient Instructions (Signed)
Thank you for coming in today. Use have a viral cough.  Please add tessalon during the day and codeine at night.  Recheck if not better.   For your thumb I think it is arthritis.  Try topical diclofenac gel 4x daily for pain.  If not better we can do an injection.   You also have a tiny ganglion cyst on your finger that we will watch.   Recheck in 1 month.    Ganglion Cyst A ganglion cyst is a noncancerous, fluid-filled lump that occurs near joints or tendons. The ganglion cyst grows out of a joint or the lining of a tendon. It most often develops in the hand or wrist, but it can also develop in the shoulder, elbow, hip, knee, ankle, or foot. The round or oval ganglion cyst can be the size of a pea or larger than a grape. Increased activity may enlarge the size of the cyst because more fluid starts to build up. What are the causes? It is not known what causes a ganglion cyst to grow. However, it may be related to:  Inflammation or irritation around the joint.  An injury.  Repetitive movements or overuse.  Arthritis.  What increases the risk? Risk factors include:  Being a woman.  Being age 2-50.  What are the signs or symptoms? Symptoms may include:  A lump. This most often appears on the hand or wrist, but it can occur in other areas of the body.  Tingling.  Pain.  Numbness.  Muscle weakness.  Weak grip.  Less movement in a joint.  How is this diagnosed? Ganglion cysts are most often diagnosed based on a physical exam. Your health care provider will feel the lump and may shine a light alongside it. If it is a ganglion cyst, a light often shines through it. Your health care provider may order an X-ray, ultrasound, or MRI to rule out other conditions. How is this treated? Ganglion cysts usually go away on their own without treatment. If pain or other symptoms are involved, treatment may be needed. Treatment is also needed if the ganglion cyst limits your movement  or if it gets infected. Treatment may include:  Wearing a brace or splint on your wrist or finger.  Taking anti-inflammatory medicine.  Draining fluid from the lump with a needle (aspiration).  Injecting a steroid into the joint.  Surgery to remove the ganglion cyst.  Follow these instructions at home:  Do not press on the ganglion cyst, poke it with a needle, or hit it.  Take medicines only as directed by your health care provider.  Wear your brace or splint as directed by your health care provider.  Watch your ganglion cyst for any changes.  Keep all follow-up visits as directed by your health care provider. This is important. Contact a health care provider if:  Your ganglion cyst becomes larger or more painful.  You have increased redness, red streaks, or swelling.  You have pus coming from the lump.  You have weakness or numbness in the affected area.  You have a fever or chills. This information is not intended to replace advice given to you by your health care provider. Make sure you discuss any questions you have with your health care provider. Document Released: 06/10/2000 Document Revised: 11/19/2015 Document Reviewed: 11/26/2013 Elsevier Interactive Patient Education  2018 [employer].

## 2017-04-20 NOTE — Progress Notes (Signed)
Samantha Price is a 56 y.o. female who presents to [facility] today for pain in the left thumb and cough and congestion.   Left thumb pain: Patient notes pain at the base of her left thumb.  This is been present for 3 weeks and is occurred without injury.  She notes pain is worse with motion of the thumb as well as pressure at the base of the thumb.  She denies any radiating pain or chills.  She has not tried any treatment yet.  Additionally patient notes cough and congestion.  This is been ongoing now for about a week.  Is associated with nasal discharge and congestion.  The cough is obnoxious and productive of small amount of sputum.  She denies significant shortness of breath or wheezing.  She denies chest pain palpitations vomiting or diarrhea.  Additionally patient notes a small nontender mobile nodule at the flexor tendon of the fourth MCP of the left hand.  This is nonpainful and does not seem to interfere with her ability to use her hand normally.  Past Medical History:  Diagnosis Date  . Anemia   . Cyst    drained from Rt breast  . Diabetes mellitus    type 2  . Fibromyalgia   . Herniated disc   . Migraines   . Wears glasses    Past Surgical History:  Procedure Laterality Date  . APPENDECTOMY    . breast cyst excison     Left,   . COLONOSCOPY W/ POLYPECTOMY     2007, Dr. Oletta Lamas.    . TUBAL LIGATION     Social History  Substance Use Topics  . Smoking status: Never Smoker  . Smokeless tobacco: Never Used  . Alcohol use Yes     ROS:  As above   Medications: Current Outpatient Prescriptions  Medication Sig Dispense Refill  . AMBULATORY NON FORMULARY MEDICATION Medication Name: Glucometer with strips and lancets to test 2-3 time per week. 1 Units PRN  . amLODipine (NORVASC) 10 MG tablet Take 1 tablet (10 mg total) by mouth daily. 90 tablet 0  . aspirin 81 MG tablet Take 81 mg by mouth daily.      . Ginkgo  Biloba 40 MG TABS Take 40 mg by mouth daily.      . JENTADUETO 2.10-998 MG TABS TAKE 1 TABLET BY MOUTH TWICE DAILY 60 tablet 1  . lisinopril-hydrochlorothiazide (PRINZIDE,ZESTORETIC) 20-12.5 MG tablet TAKE ONE TABLET BY MOUTH TWICE DAILY. 60 tablet 6  . meclizine (ANTIVERT) 25 MG tablet Take 1 tablet (25 mg total) by mouth 3 (three) times daily as needed for dizziness or nausea. 30 tablet 3  . Multiple Vitamin (MULTIVITAMIN PO) Take by mouth daily.      Marland Kitchen topiramate (TOPAMAX) 50 MG tablet Take 1 tablet (50 mg total) by mouth 2 (two) times daily. 60 tablet 3  . benzonatate (TESSALON) 200 MG capsule Take 1 capsule (200 mg total) by mouth 3 (three) times daily as needed for cough. 45 capsule 0  . diclofenac sodium (VOLTAREN) 1 % GEL Apply 2 g topically 4 (four) times daily. To affected joint. 100 g 11  . guaiFENesin-codeine 100-10 MG/5ML syrup Take 5 mLs by mouth every 6 (six) hours as needed for cough. 120 mL 0   No current facility-administered medications for this visit.    Allergies  Allergen Reactions  . Latex Rash     Exam:  BP (!) 189/99   Pulse 60  Temp 97.6 F (36.4 C) (Oral)   Wt 159 lb (72.1 kg)   LMP 06/28/2011   BMI 29.08 kg/m  General well-appearing HEENT: EOMI, nasal discharge.  Posterior pharynx with mild cobblestoning. Lungs: CTABL Nl WOB  Heart: RRR no MRG Abd: NABS, NT, ND Exts: Non edematous BL  LE, warm and well perfused.   MSK:  Left thumb slightly swollen at the the thenar eminence .Marland Kitchen  Tender to palpation at the [facility].  Thumb motion is intact but mildly painful with opposition.  Pulses capillary refill and sensation are intact.  Small palpable cystic structure present at the flexor tendon at left fourth MCP  Limited musculoskeletal ultrasound of the left base of the thumb reveals a moderate effusion with bossing indicative of DJD.  Normal bony structures otherwise.  Additionally limited ultrasound left fourth MCP flexor tendon reveals a small palpable  cystic structure consistent with ganglion cyst.  Normal bony structures   No results found for this or any previous visit (from the past 48 hour(s)). No results found.    Assessment and Plan: 56 y.o. female with  Left thumb pain CMC DJD.  Discussed options.  Plan for trial of conservative management with diclofenac gel.  Recheck in 1 month next step would be injection versus referral to hand physical therapy.  Nodule at the left fourth MCP is a ganglion cyst diagnosed with ultrasound.  Plan for watchful waiting and recheck in 1 month.  Cough congestion is viral URI.  Plan for symptomatic management with Tessalon Perles and codeine cough syrup.  Recheck if not improving.    Orders Placed This Encounter  Procedures  . DG Finger Thumb Left    Order Specific Question:   Reason for exam:    Answer:   eval pain base of thumb. Suspect CMC djd    Order Specific Question:   Is the patient pregnant?    Answer:   No    Order Specific Question:   Preferred imaging location?    Answer:   [facility]   Meds ordered this encounter  Medications  . DISCONTD: diclofenac sodium (VOLTAREN) 1 % GEL    Sig: Apply 2 g topically 4 (four) times daily. To affected joint.    Dispense:  100 g    Refill:  11  . diclofenac sodium (VOLTAREN) 1 % GEL    Sig: Apply 2 g topically 4 (four) times daily. To affected joint.    Dispense:  100 g    Refill:  11  . benzonatate (TESSALON) 200 MG capsule    Sig: Take 1 capsule (200 mg total) by mouth 3 (three) times daily as needed for cough.    Dispense:  45 capsule    Refill:  0  . guaiFENesin-codeine 100-10 MG/5ML syrup    Sig: Take 5 mLs by mouth every 6 (six) hours as needed for cough.    Dispense:  120 mL    Refill:  0    Discussed warning signs or symptoms. Please see discharge instructions. Patient expresses understanding.

## 2017-04-24 ENCOUNTER — Other Ambulatory Visit: Payer: Self-pay | Admitting: Family Medicine

## 2017-05-14 ENCOUNTER — Other Ambulatory Visit: Payer: Self-pay | Admitting: Family Medicine

## 2017-06-19 ENCOUNTER — Other Ambulatory Visit: Payer: Self-pay | Admitting: Family Medicine

## 2017-07-13 ENCOUNTER — Telehealth: Payer: Self-pay | Admitting: Family Medicine

## 2017-07-13 NOTE — Telephone Encounter (Signed)
I called pt and left a VM stating to call alnd schedule a F/u appt with Dr.Metheney ( BP/DM follow up )

## 2017-07-20 ENCOUNTER — Other Ambulatory Visit: Payer: Self-pay

## 2017-07-20 MED ORDER — LINAGLIPTIN-METFORMIN HCL 2.5-1000 MG PO TABS: ORAL_TABLET | ORAL | 0 refills | Status: DC

## 2017-07-20 NOTE — Progress Notes (Signed)
Pt called to request Rx, advised she needs follow up. She will schedule. She has had a death in the family, that is why her follow up appt is delayed.

## 2017-08-09 ENCOUNTER — Ambulatory Visit (INDEPENDENT_AMBULATORY_CARE_PROVIDER_SITE_OTHER): Payer: BLUE CROSS/BLUE SHIELD | Admitting: Physician Assistant

## 2017-08-09 ENCOUNTER — Encounter: Payer: Self-pay | Admitting: Family Medicine

## 2017-08-09 VITALS — BP 162/74 | HR 69 | Ht 62.0 in | Wt 159.0 lb

## 2017-08-09 DIAGNOSIS — I10 Essential (primary) hypertension: Secondary | ICD-10-CM | POA: Diagnosis not present

## 2017-08-09 DIAGNOSIS — E119 Type 2 diabetes mellitus without complications: Secondary | ICD-10-CM

## 2017-08-09 DIAGNOSIS — G43909 Migraine, unspecified, not intractable, without status migrainosus: Secondary | ICD-10-CM | POA: Diagnosis not present

## 2017-08-09 LAB — LIPID PANEL
Cholesterol: 216 mg/dL — ABNORMAL HIGH (ref <200)
HDL: 71 mg/dL (ref >50)
LDL Cholesterol (Calc): 119 mg/dL (calc) — ABNORMAL HIGH
Non-HDL Cholesterol (Calc): 145 mg/dL (calc) — ABNORMAL HIGH (ref <130)
Total CHOL/HDL Ratio: 3 (calc) (ref <5.0)
Triglycerides: 143 mg/dL (ref <150)

## 2017-08-09 LAB — COMPLETE METABOLIC PANEL WITH GFR
AG Ratio: 1.6 (calc) (ref 1.0–2.5)
ALT: 11 U/L (ref 6–29)
AST: 15 U/L (ref 10–35)
Albumin: 4.4 g/dL (ref 3.6–5.1)
Alkaline phosphatase (APISO): 56 U/L (ref 33–130)
BUN: 13 mg/dL (ref 7–25)
CO2: 29 mmol/L (ref 20–32)
Calcium: 9.7 mg/dL (ref 8.6–10.4)
Chloride: 103 mmol/L (ref 98–110)
Creat: 0.83 mg/dL (ref 0.50–1.05)
GFR, Est African American: 90 mL/min/{1.73_m2} (ref >=60)
GFR, Est Non African American: 78 mL/min/{1.73_m2} (ref >=60)
GLOBULIN: 2.8 g/dL (ref 1.9–3.7)
Glucose, Bld: 105 mg/dL — ABNORMAL HIGH (ref 65–99)
POTASSIUM: 3.9 mmol/L (ref 3.5–5.3)
SODIUM: 141 mmol/L (ref 135–146)
Total Bilirubin: 0.5 mg/dL (ref 0.2–1.2)
Total Protein: 7.2 g/dL (ref 6.1–8.1)

## 2017-08-09 LAB — POCT GLYCOSYLATED HEMOGLOBIN (HGB A1C): HEMOGLOBIN A1C: 5.4

## 2017-08-09 LAB — TSH: TSH: 0.95 mIU/L (ref 0.40–4.50)

## 2017-08-09 MED ORDER — LINAGLIPTIN-METFORMIN HCL 2.5-1000 MG PO TABS: ORAL_TABLET | ORAL | 1 refills | Status: DC

## 2017-08-09 MED ORDER — LISINOPRIL-HYDROCHLOROTHIAZIDE 20-12.5 MG PO TABS: 1.0000 | ORAL_TABLET | Freq: Two times a day (BID) | ORAL | 1 refills | Status: DC

## 2017-08-09 MED ORDER — TOPIRAMATE 50 MG PO TABS: 50.0000 mg | ORAL_TABLET | Freq: Two times a day (BID) | ORAL | 1 refills | Status: DC

## 2017-08-09 MED ORDER — AMLODIPINE BESYLATE 10 MG PO TABS: 10.0000 mg | ORAL_TABLET | Freq: Every day | ORAL | 1 refills | Status: DC

## 2017-08-09 NOTE — Patient Instructions (Signed)
Call to check on the Shingles vaccine.  If covered by your plan the can schedule a nurse visit and we can recheck your BP at that time.

## 2017-08-09 NOTE — Progress Notes (Signed)
Subjective:    Patient ID: Samantha Price, female    DOB: 1960/11/21, 57 y.o.   MRN: [id number]  HPI  57 year old female is here today to follow-up.  Unfortunately over this past year she is had a very stressful year.  She lost her job but then was able to find a new one.  She is now working for [employer].  They are now baby and he is merging with another bank so she is worried that she may not have a job.  Her father-in-law passed away.  Diabetes - no hypoglycemic events. No wounds or sores that are not healing well. No increased thirst or urination. Checking glucose at home. Taking medications as prescribed without any side effects.  Hypertension- Pt denies chest pain, SOB, dizziness, or heart palpitations.  Taking meds as directed w/o problems.  Denies medication side effects.    Migraine HA -she is been doing well overall.  She has been taking her topiramate regularly but just filled her last prescription.   Review of Systems  BP (!) 164/78   Pulse 69   Ht 5\' 2"  (1.575 m)   Wt 159 lb (72.1 kg)   LMP 06/28/2011   SpO2 98%   BMI 29.08 kg/m     Allergies  Allergen Reactions  . Latex Rash    Past Medical History:  Diagnosis Date  . Anemia   . Cyst    drained from Rt breast  . Diabetes mellitus    type 2  . Fibromyalgia   . Herniated disc   . Migraines   . Wears glasses     Past Surgical History:  Procedure Laterality Date  . APPENDECTOMY    . breast cyst excison     Left,   . COLONOSCOPY W/ POLYPECTOMY     2005/10/21, Dr. Oletta Lamas.    . TUBAL LIGATION      Social History   Socioeconomic History  . Marital status: Widowed    Spouse name: Annie Main   . Number of children: 3  . Years of education: BA  . Highest education level: Not on file  Social Needs  . Financial resource strain: Not on file  . Food insecurity - worry: Not on file  . Food insecurity - inability: Not on file  . Transportation needs - medical: Not on file  . Transportation needs -  non-medical: Not on file  Occupational History  . Occupation: Insurance underwriter: [employer]    Comment: [employer]  Tobacco Use  . Smoking status: Never Smoker  . Smokeless tobacco: Never Used  Substance and Sexual Activity  . Alcohol use: Yes  . Drug use: Yes  . Sexual activity: Not on file    Comment: widow, regularly exercises  Other Topics Concern  . Not on file  Social History Narrative    Has 3 daughters. All 3 out of the home now.   Her husband committed suicide in 10-21-08 from PTSD, and mother died 49 months prior to that.  Mom died in a tragic accident.     Family History  Problem Relation Age of Onset  . Diabetes Mother   . Cancer Father        stomach  . Hypertrophic cardiomyopathy Brother   . Colon cancer Other        aunt, cousin  . Heart disease Other        family hisatory  . Leukemia Unknown  1st cousin  . Breast cancer Unknown        aunt  . Stroke Unknown        Grandparent    Outpatient Encounter Medications as of 08/09/2017  Medication Sig  . AMBULATORY NON FORMULARY MEDICATION Medication Name: Glucometer with strips and lancets to test 2-3 time per week.  Marland Kitchen amLODipine (NORVASC) 10 MG tablet Take 1 tablet (10 mg total) by mouth daily.  Marland Kitchen aspirin 81 MG tablet Take 81 mg by mouth daily.    . diclofenac sodium (VOLTAREN) 1 % GEL Apply 2 g topically 4 (four) times daily. To affected joint.  . Ginkgo Biloba 40 MG TABS Take 40 mg by mouth daily.    . Linagliptin-Metformin HCl (JENTADUETO) 2.10-998 MG TABS TAKE 1 TABLET BY MOUTH TWICE DAILY  . lisinopril-hydrochlorothiazide (PRINZIDE,ZESTORETIC) 20-12.5 MG tablet Take 1 tablet by mouth 2 (two) times daily.  . Multiple Vitamin (MULTIVITAMIN PO) Take by mouth daily.    Marland Kitchen topiramate (TOPAMAX) 50 MG tablet Take 1 tablet (50 mg total) by mouth 2 (two) times daily.  . [DISCONTINUED] amLODipine (NORVASC) 10 MG tablet Take 1 tablet (10 mg total) by mouth daily.  . [DISCONTINUED]  Linagliptin-Metformin HCl (JENTADUETO) 2.10-998 MG TABS TAKE 1 TABLET BY MOUTH TWICE DAILY, LAST REFILL, PLEASE CALL OFFICE TO SCHEDULE AN APPOINTMENT  . [DISCONTINUED] lisinopril-hydrochlorothiazide (PRINZIDE,ZESTORETIC) 20-12.5 MG tablet TAKE 1 TABLET BY MOUTH TWICE DAILY  . [DISCONTINUED] meclizine (ANTIVERT) 25 MG tablet Take 1 tablet (25 mg total) by mouth 3 (three) times daily as needed for dizziness or nausea.  . [DISCONTINUED] topiramate (TOPAMAX) 50 MG tablet Take 1 tablet (50 mg total) by mouth 2 (two) times daily.  . [DISCONTINUED] benzonatate (TESSALON) 200 MG capsule Take 1 capsule (200 mg total) by mouth 3 (three) times daily as needed for cough.  . [DISCONTINUED] guaiFENesin-codeine 100-10 MG/5ML syrup Take 5 mLs by mouth every 6 (six) hours as needed for cough.   No facility-administered encounter medications on file as of 08/09/2017.          Objective:   Physical Exam  Constitutional: She is oriented to person, place, and time. She appears well-developed and well-nourished.  HENT:  Head: Normocephalic and atraumatic.  Cardiovascular: Normal rate, regular rhythm and normal heart sounds.  Pulmonary/Chest: Effort normal and breath sounds normal.  Neurological: She is alert and oriented to person, place, and time.  Skin: Skin is warm and dry.  Psychiatric: She has a normal mood and affect. Her behavior is normal.        Assessment & Plan:  DM -uncontrolled.  Hgb A1Ct for 5.4 today which is fantastic.  Continue current regimen.  Follow-up in 4 months.  Due for labs.  HTN -due for CMP and lipid panel.  Plan to restart amlodipine which she has been out of.  Hopefully if she comes back and get the shingles vaccine she can have her blood pressure rechecked at that time.   Migraine HA -stable, doing well.  We will refill Topamax.  Shingles vaccine Rx given.

## 2018-04-19 ENCOUNTER — Other Ambulatory Visit: Payer: Self-pay | Admitting: Family Medicine

## 2018-04-19 MED ORDER — LISINOPRIL-HYDROCHLOROTHIAZIDE 20-12.5 MG PO TABS: 1.0000 | ORAL_TABLET | Freq: Two times a day (BID) | ORAL | 0 refills | Status: DC

## 2018-06-04 ENCOUNTER — Other Ambulatory Visit: Payer: Self-pay | Admitting: Family Medicine

## 2018-07-30 ENCOUNTER — Other Ambulatory Visit: Payer: Self-pay | Admitting: Family Medicine

## 2018-07-30 NOTE — Telephone Encounter (Signed)
Samantha Price called and left a message stating she is in [place] due to a family emergency. She is unable to come in until March. She requests a refill on Jentadueto and Lisinopril. She has not been seen since 08/09/2017. Please advise.

## 2018-07-31 ENCOUNTER — Other Ambulatory Visit: Payer: Self-pay | Admitting: Family Medicine

## 2018-08-02 ENCOUNTER — Telehealth: Payer: Self-pay

## 2018-08-02 NOTE — Telephone Encounter (Signed)
I have sent the information to the insurance online and per Covermymeds the insurance is lapsed. I have called the patient and left a VM that the number she has given me for the ID is lapsed and we will need a new card to try and get her medication approved. Patient was also advised to make an appointment.

## 2018-08-02 NOTE — Telephone Encounter (Signed)
Genise called and states she needs a PA for [employer].    [employer] [place] - [id number] [phone]

## 2018-08-06 NOTE — Telephone Encounter (Signed)
Patient called back and left a VM that she had called the insurance to follow up and was told by her insurance that the medication is still in process and they would make a decision this week.

## 2018-08-07 NOTE — Telephone Encounter (Signed)
Received a fax from [facility] that Samantha Price has been approved from 08/03/2018 through 08/03/2021. Pharmacy aware and form sent to scan. Patient has been notified and voices understanding.

## 2018-08-14 ENCOUNTER — Ambulatory Visit: Payer: BLUE CROSS/BLUE SHIELD | Admitting: Family Medicine

## 2018-08-14 ENCOUNTER — Encounter: Payer: Self-pay | Admitting: Family Medicine

## 2018-08-14 ENCOUNTER — Ambulatory Visit (INDEPENDENT_AMBULATORY_CARE_PROVIDER_SITE_OTHER): Payer: BLUE CROSS/BLUE SHIELD

## 2018-08-14 VITALS — BP 117/56 | HR 72 | Wt 169.0 lb

## 2018-08-14 DIAGNOSIS — R079 Chest pain, unspecified: Secondary | ICD-10-CM | POA: Diagnosis not present

## 2018-08-14 DIAGNOSIS — R0602 Shortness of breath: Secondary | ICD-10-CM | POA: Diagnosis not present

## 2018-08-14 DIAGNOSIS — I5189 Other ill-defined heart diseases: Secondary | ICD-10-CM

## 2018-08-14 DIAGNOSIS — I10 Essential (primary) hypertension: Secondary | ICD-10-CM | POA: Diagnosis not present

## 2018-08-14 DIAGNOSIS — M542 Cervicalgia: Secondary | ICD-10-CM | POA: Diagnosis not present

## 2018-08-14 DIAGNOSIS — E119 Type 2 diabetes mellitus without complications: Secondary | ICD-10-CM

## 2018-08-14 DIAGNOSIS — R002 Palpitations: Secondary | ICD-10-CM

## 2018-08-14 NOTE — Patient Instructions (Addendum)
Thank you for coming in today. Attend PT for the neck.  Get xray and labs.   You should hear from cardiology about an appointment and the Holter monitor.   Let me know if you do not hear anything.    Recheck with me in 2-3 weeks.     Palpitations Palpitations are feelings that your heartbeat is irregular or is faster than normal. It may feel like your heart is fluttering or skipping a beat. Palpitations are usually not a serious problem. They may be caused by many things, including smoking, caffeine, alcohol, stress, and certain medicines or drugs. Most causes of palpitations are not serious. However, some palpitations can be a sign of a serious problem. You may need further tests to rule out serious medical problems. Follow these instructions at home:     Pay attention to any changes in your condition. Take these actions to help manage your symptoms: Eating and drinking  Avoid foods and drinks that may cause palpitations. These may include: ? Caffeinated coffee, tea, soft drinks, diet pills, and energy drinks. ? Chocolate. ? Alcohol. Lifestyle  Take steps to reduce your stress and anxiety. Things that can help you relax include: ? Yoga. ? Mind-body activities, such as deep breathing, meditation, or using words and images to create positive thoughts (guided imagery). ? Physical activity, such as swimming, jogging, or walking. Tell your health care provider if your palpitations increase with activity. If you have chest pain or shortness of breath with activity, do not continue the activity until you are seen by your health care provider. ? Biofeedback. This is a method that helps you learn to use your mind to control things in your body, such as your heartbeat.  Do not use drugs, including cocaine or ecstasy. Do not use marijuana.  Get plenty of rest and sleep. Keep a regular bed time. General instructions  Take over-the-counter and prescription medicines only as told by your  health care provider.  Do not use any products that contain nicotine or tobacco, such as cigarettes and e-cigarettes. If you need help quitting, ask your health care provider.  Keep all follow-up visits as told by your health care provider. This is important. These may include visits for further testing if palpitations do not go away or get worse. Contact a health care provider if you:  Continue to have a fast or irregular heartbeat after 24 hours.  Notice that your palpitations occur more often. Get help right away if you:  Have chest pain or shortness of breath.  Have a severe headache.  Feel dizzy or you faint. Summary  Palpitations are feelings that your heartbeat is irregular or is faster than normal. It may feel like your heart is fluttering or skipping a beat.  Palpitations may be caused by many things, including smoking, caffeine, alcohol, stress, certain medicines, and drugs.  Although most causes of palpitations are not serious, some causes can be a sign of a serious medical problem.  Get help right away if you faint or have chest pain, shortness of breath, a severe headache, or dizziness. This information is not intended to replace advice given to you by your health care provider. Make sure you discuss any questions you have with your health care provider. Document Released: 06/10/2000 Document Revised: 07/26/2017 Document Reviewed: 07/26/2017 Elsevier Interactive Patient Education  2019 [employer].

## 2018-08-14 NOTE — Progress Notes (Signed)
Samantha Price is a 58 y.o. female who presents to [facility] today for neck pain and shortness of breath /palpitations.  Samantha Price over the past few months has had been having episodes of some shortness of breath associated with palpitations.  These can occur at any time and are not typically related to exertion or food.  She was awoken from her sleep yesterday morning with an episode and had shortness of breath.  It passed quickly.  She describes a heart pounding sensation during it.  She denies significant chest pain.  She denies leg swelling orthopnea.  She notes her brother has a significant cardiac history resulted in sudden near cardiac death.  He now has an implanted cardiac defibrillator.  He apparently has some sort of genetic cardiac disease that she should be aware of but she cannot remember what it is.  She is not sure she ever got tested for it.  She denies any family members who have drowned or had other sudden cardiac death.  Additionally Samantha Price notes left-sided neck pain.  This is been ongoing for months as well.  She has pain radiating or referring to the base of her skull associate with a headache.  Additionally she has some pain radiating down her left arm.  She denies any weakness or numbness or loss of function.  Pain is worsening somewhat.  She denies any injury.  Symptoms are moderate.  She is tried some over-the-counter medications which helped a little.  She is currently taking Aleve occasionally.  She has a pertinent history for right-sided cervical radicular pain in 2016 that improved with physical therapy and epidural steroid injection.   Samantha Price has several other chronic medical problems including typically well controlled diabetes.  She is been in [place] for the past several months and has been somewhat lost to follow-up.  She is moved back  to the area and has resumed her medications and has a follow-up appointment scheduled with her primary care provider in the end of March.   ROS as above:  Exam:  BP (!) 117/56   Pulse 72   Wt 169 lb (76.7 kg)   LMP 06/28/2011   BMI 30.91 kg/m  Wt Readings from Last 5 Encounters:  08/14/18 169 lb (76.7 kg)  08/09/17 159 lb (72.1 kg)  04/20/17 159 lb (72.1 kg)  08/22/16 154 lb (69.9 kg)  06/01/16 145 lb (65.8 kg)    Gen: Well NAD HEENT: EOMI,  MMM Lungs: Normal work of breathing. CTABL Heart: RRR no soft systolic murmur not radiating much Abd: NABS, Soft. Nondistended, Nontender Exts: Brisk capillary refill, warm and well perfused.  C-spine: Nontender to spinal midline.  Tender palpation left cervical paraspinal musculature and trapezius. Decreased cervical motion. 's positive Spurling's test. Upper extremity strength is intact throughout. Reflexes are equal and normal bilaterally.  Sensation is intact throughout.   Lab and Radiology Results  Two-view chest x-ray images personally independently reviewed Bronchitic changes present.  Thoracolumbar scoliosis present.  No acute infiltrate. Await formal radiology review  Twelve-lead EKG shows normal sinus rhythm at 70 bpm.  No ST segment elevation or depression.  QTc 429 Normal EKG.     EXAM:  MRI CERVICAL SPINE WITHOUT CONTRAST  TECHNIQUE: Multiplanar, multisequence MR imaging of the cervical spine was performed. No intravenous contrast was administered.  COMPARISON:  MRI of the cervical spine 04/28/2006  FINDINGS: Normal signal is present in the cervical and upper thoracic  spinal cord to the lowest imaged level, T2-3. There straightening of the normal cervical lordosis as before. Marrow signal and vertebral body heights are maintained. Alignment is anatomic. Craniocervical junction is within normal limits.  Flow is present in the vertebral arteries.  C2-3:  Negative.  C3-4: A leftward disc osteophyte  complex is stable. Mild left foraminal narrowing is unchanged.  C4-5: A broad-based disc osteophyte complex is asymmetric to the right. There is some progression with effacement of the ventral CSF and potential contact of the cord. The foramina are patent.  C5-6: A broad-based disc osteophyte complex effaces the ventral CSF. Moderate foraminal narrowing has progressed bilaterally.  C6-7: A shallow right paramedian disc protrusion is present without significant stenosis.  C7-T1:  Negative.  IMPRESSION: 1. Progressive broad-based disc osteophyte complex with moderate foraminal narrowing bilaterally at L5-S1. 2. Asymmetric rightward disc osteophyte complex with some progression and effacement of ventral CSF with mild to moderate central canal stenosis and potential contact of the cord at C4-5. 3. Mild left foraminal narrowing at C3-4 is stable. 4. Shallow right paramedian disc protrusion at C6-7 without significant stenosis.  I personally (independently) visualized and performed the interpretation of the MRI images attached in this note.    Electronically Signed   By: San Morelle M.D.   On: 05/11/2015 17:11  *[facility]*                       [place], [place] [address]                            [phone]  ------------------------------------------------------------------- Transthoracic Echocardiography  Patient:    Samantha, Price MR #:       [id number] Study Date: 06/16/2016 Gender:     F Price:        64 Height:     157.5 cm Weight:     65.8 kg BSA:        1.71 m^2 Pt. Status: Room:   ATTENDING    Samantha Price  REFERRING    Samantha Price  PERFORMING   [facility], Inpatient  SONOGRAPHER  [facility]  cc:  ------------------------------------------------------------------- LV EF: 60% -    65%  ------------------------------------------------------------------- History:   PMH:  Family history of hypertrophic cardiomyopathy. Risk factors:  Hypertension. Diabetes mellitus.  ------------------------------------------------------------------- Study Conclusions  - Left ventricle: The cavity size was normal. There was moderate   concentric hypertrophy. Systolic function was normal. The   estimated ejection fraction was in the range of 60% to 65%. Wall   motion was normal; there were no regional wall motion   abnormalities. Features are consistent with a pseudonormal left   ventricular filling pattern, with concomitant abnormal relaxation   and increased filling pressure (grade 2 diastolic dysfunction).   Doppler parameters are consistent with indeterminate ventricular   filling pressure. - Aortic valve: There was no regurgitation. - Mitral valve: Transvalvular velocity was within the normal range.   There was no evidence for stenosis. There was no regurgitation. - Right ventricle: The cavity size was normal. Wall thickness was   normal. Systolic function was normal. - Atrial septum: No defect or patent foramen ovale was identified. - Tricuspid valve: There was  trivial regurgitation. - Pulmonary arteries: Systolic pressure was within the normal   range. PA peak pressure: 25 mm Hg (S).     Assessment and Plan: 58 y.o. female with  Cervical pain.  Very likely paraspinal muscular spasm and dysfunction.  Patient may also have a component of C7 radiculopathy based on radiating pain.  MRI from 2016 does show bulging disc that could cause symptoms in this level if worsening.  Plan for initial trial physical therapy.  Recheck in about 3 weeks.  Shortness of breath and palpitations: Patient has symptoms concerning for tachypalpitations possibly atrial fibrillation or SVT.  She has this unknown family history.  She cannot recall what it her brother's diagnosis was but is in the  process of looking it up or researching it.  EKG today looks pretty normal.  Echocardiogram from 6314 showed diastolic dysfunction but no valvular abnormalities.  Patient does have a bit of a murmur today as well.  Plan for chest x-ray as above, Holter monitor, and referral to cardiology.  Recheck in 3 weeks.  Precautions reviewed.  Additionally will obtain basic labs to work-up her tachypalpitations.  Additionally will check A1c for her history of diabetes and direct LDL. Follow-up with PCP as scheduled on March 31.  PDMP not reviewed this encounter. Orders Placed This Encounter  Procedures  . DG Chest 2 View    Order Specific Question:   Reason for exam:    Answer:   Chest pain sob assess intra-thoracic pathology    Order Specific Question:   Is the patient pregnant?    Answer:   No    Order Specific Question:   Preferred imaging location?    Answer:   [facility]  . CBC  . COMPLETE METABOLIC PANEL WITH GFR  . LDL cholesterol, direct  . Hemoglobin A1c  . Ambulatory referral to Cardiology    Referral Priority:   Routine    Referral Type:   Consultation    Referral Reason:   Specialty Services Required    Requested Specialty:   Cardiology    Number of Visits Requested:   1  . Ambulatory referral to Physical Therapy    Referral Priority:   Routine    Referral Type:   Physical Medicine    Referral Reason:   Specialty Services Required    Requested Specialty:   Physical Therapy  . Holter monitor - 48 hour    Standing Status:   Future    Standing Expiration Date:   08/14/2028    Order Specific Question:   Where should this test be performed?    Answer:   [facility]  . EKG 12-Lead   No orders of the defined types were placed in this encounter.    Historical information moved to improve visibility of documentation.  Past Medical History:  Diagnosis Date  . Anemia   . Cyst    drained from Rt breast  . Diabetes mellitus    type 2  . Fibromyalgia   . Herniated  disc   . Migraines   . Wears glasses    Past Surgical History:  Procedure Laterality Date  . APPENDECTOMY    . breast cyst excison     Left,   . COLONOSCOPY W/ POLYPECTOMY     2007, Dr. Oletta Lamas.    . TUBAL LIGATION     Social History   Tobacco Use  . Smoking status: Never Smoker  . Smokeless tobacco: Never Used  Substance Use Topics  .  Alcohol use: Yes   family history includes Breast cancer in her unknown relative; Cancer in her father; Colon cancer in an other family member; Diabetes in her mother; Heart disease in an other family member; Hypertrophic cardiomyopathy in her brother; Leukemia in her unknown relative; Stroke in her unknown relative.  Medications: Current Outpatient Medications  Medication Sig Dispense Refill  . AMBULATORY NON FORMULARY MEDICATION Medication Name: Glucometer with strips and lancets to test 2-3 time per week. 1 Units PRN  . amLODipine (NORVASC) 10 MG tablet Take 1 tablet (10 mg total) by mouth daily. 90 tablet 1  . aspirin 81 MG tablet Take 81 mg by mouth daily.      . diclofenac sodium (VOLTAREN) 1 % GEL Apply 2 g topically 4 (four) times daily. To affected joint. 100 g 11  . Ginkgo Biloba 40 MG TABS Take 40 mg by mouth daily.      . JENTADUETO 2.10-998 MG TABS TAKE 1 TABLET BY MOUTH TWICE DAILY -  FINAL  REFILL.  APPOINTMENT  REQUIRED 180 tablet 0  . lisinopril-hydrochlorothiazide (PRINZIDE,ZESTORETIC) 20-12.5 MG tablet TAKE 1 TABLET BY MOUTH TWICE DAILY **  NEED  APPOINTMENT  FOR  REFILLS  ** 180 tablet 0  . Multiple Vitamin (MULTIVITAMIN PO) Take by mouth daily.      Marland Kitchen topiramate (TOPAMAX) 50 MG tablet Take 1 tablet (50 mg total) by mouth 2 (two) times daily. 180 tablet 1   No current facility-administered medications for this visit.    Allergies  Allergen Reactions  . Latex Rash     Discussed warning signs or symptoms. Please see discharge instructions. Patient expresses understanding.

## 2018-08-15 ENCOUNTER — Telehealth: Payer: Self-pay

## 2018-08-15 DIAGNOSIS — R002 Palpitations: Secondary | ICD-10-CM

## 2018-08-15 DIAGNOSIS — R011 Cardiac murmur, unspecified: Secondary | ICD-10-CM

## 2018-08-15 LAB — HEMOGLOBIN A1C
EAG (MMOL/L): 8.2 (calc)
HEMOGLOBIN A1C: 6.8 %{Hb} — AB (ref <5.7)
MEAN PLASMA GLUCOSE: 148 (calc)

## 2018-08-15 LAB — COMPLETE METABOLIC PANEL WITH GFR
AG Ratio: 1.5 (calc) (ref 1.0–2.5)
ALBUMIN MSPROF: 4.3 g/dL (ref 3.6–5.1)
ALKALINE PHOSPHATASE (APISO): 71 U/L (ref 37–153)
ALT: 13 U/L (ref 6–29)
AST: 16 U/L (ref 10–35)
BILIRUBIN TOTAL: 0.4 mg/dL (ref 0.2–1.2)
BUN / CREAT RATIO: 14 (calc) (ref 6–22)
BUN: 15 mg/dL (ref 7–25)
CHLORIDE: 103 mmol/L (ref 98–110)
CO2: 28 mmol/L (ref 20–32)
Calcium: 9.8 mg/dL (ref 8.6–10.4)
Creat: 1.11 mg/dL — ABNORMAL HIGH (ref 0.50–1.05)
GFR, Est African American: 63 mL/min/{1.73_m2} (ref >=60)
GFR, Est Non African American: 54 mL/min/{1.73_m2} — ABNORMAL LOW (ref >=60)
GLUCOSE: 134 mg/dL — AB (ref 65–99)
Globulin: 2.9 g/dL (calc) (ref 1.9–3.7)
Potassium: 4 mmol/L (ref 3.5–5.3)
Sodium: 139 mmol/L (ref 135–146)
Total Protein: 7.2 g/dL (ref 6.1–8.1)

## 2018-08-15 LAB — LDL CHOLESTEROL, DIRECT: LDL DIRECT: 113 mg/dL — AB (ref <100)

## 2018-08-15 LAB — CBC
HCT: 36.5 % (ref 35.0–45.0)
Hemoglobin: 12.3 g/dL (ref 11.7–15.5)
MCH: 30 pg (ref 27.0–33.0)
MCHC: 33.7 g/dL (ref 32.0–36.0)
MCV: 89 fL (ref 80.0–100.0)
MPV: 9.9 fL (ref 7.5–12.5)
PLATELETS: 381 10*3/uL (ref 140–400)
RBC: 4.1 10*6/uL (ref 3.80–5.10)
RDW: 11.8 % (ref 11.0–15.0)
WBC: 9.1 10*3/uL (ref 3.8–10.8)

## 2018-08-15 NOTE — Telephone Encounter (Signed)
With normal EKG I think it is okay to wait until you see the cardiologist on March 4.  However limit know if you do not get the Holter monitor placed before then.  Additionally I will go ahead and order an echocardiogram which will give [place] a better idea of what is going on.  This is all more information for the cardiologist and they will probably be doing this test anyway.  If you need a sooner appointment I am happy to see you for follow-up sooner if needed.

## 2018-08-15 NOTE — Telephone Encounter (Signed)
Echocardiogram authorized.   Pt scheduled for OV.

## 2018-08-15 NOTE — Telephone Encounter (Signed)
Samantha Price did receive a call from Cardiology for appointment March 4th. She wanted to know if Dr Georgina Snell can get her an earlier appointment. She is very worried about her chest pain.

## 2018-08-16 ENCOUNTER — Encounter: Payer: Self-pay | Admitting: Family Medicine

## 2018-08-16 ENCOUNTER — Ambulatory Visit (INDEPENDENT_AMBULATORY_CARE_PROVIDER_SITE_OTHER): Payer: BLUE CROSS/BLUE SHIELD | Admitting: Family Medicine

## 2018-08-16 ENCOUNTER — Ambulatory Visit (INDEPENDENT_AMBULATORY_CARE_PROVIDER_SITE_OTHER): Payer: BLUE CROSS/BLUE SHIELD

## 2018-08-16 VITALS — BP 147/83 | HR 74 | Ht 62.0 in | Wt 165.0 lb

## 2018-08-16 DIAGNOSIS — R079 Chest pain, unspecified: Secondary | ICD-10-CM | POA: Diagnosis not present

## 2018-08-16 DIAGNOSIS — Z8249 Family history of ischemic heart disease and other diseases of the circulatory system: Secondary | ICD-10-CM

## 2018-08-16 DIAGNOSIS — I10 Essential (primary) hypertension: Secondary | ICD-10-CM | POA: Diagnosis not present

## 2018-08-16 DIAGNOSIS — M47812 Spondylosis without myelopathy or radiculopathy, cervical region: Secondary | ICD-10-CM

## 2018-08-16 DIAGNOSIS — M4802 Spinal stenosis, cervical region: Secondary | ICD-10-CM | POA: Diagnosis not present

## 2018-08-16 DIAGNOSIS — M542 Cervicalgia: Secondary | ICD-10-CM | POA: Diagnosis not present

## 2018-08-16 MED ORDER — METOPROLOL TARTRATE 25 MG PO TABS: 25.0000 mg | ORAL_TABLET | Freq: Two times a day (BID) | ORAL | 3 refills | Status: DC

## 2018-08-16 MED ORDER — GABAPENTIN 300 MG PO CAPS: ORAL_CAPSULE | ORAL | 3 refills | Status: DC

## 2018-08-16 MED ORDER — NITROGLYCERIN 0.4 MG SL SUBL: 0.4000 mg | SUBLINGUAL_TABLET | SUBLINGUAL | 3 refills | Status: DC | PRN

## 2018-08-16 NOTE — Patient Instructions (Addendum)
Thank you for coming in today. I will see if I can get the studies moved up sooner.   Start metoprolol twice daily for heart protection and blood pressure.  Use nitroglycerin if having bad chest pain.   Get labs and xray now.   Start gabapentin for nerve pain and increase to 3x daily as needed.   Attend physical therapy.   Recheck next week.    [facility] is [phone]    Hypertrophic Cardiomyopathy  Hypertrophic cardiomyopathy (HCM) is a heart condition in which part of the heart muscle gets too thick. The condition can cause a dangerous and abnormal heart rhythm. It can also weaken the heart over time. The heart is divided into four chambers. The thickening usually affects the pumping chamber on the lower left (left ventricle). HCM may also affect the valve that lets blood flow out of the left ventricle (mitral valve). Symptoms often begin at about age 56. What are the causes? This condition is usually caused by abnormal genes that control heart muscle growth. These abnormal genes are passed down through families (are inherited). What increases the risk? This condition is more likely to develop in people with a family history of HCM. What are the signs or symptoms? Symptoms of this condition include:  Shortness of breath, especially after exercising or lying down.  Chest pain.  Dizziness.  Fatigue.  Irregular or fast heart rate.  Fainting, especially after physical activity. How is this diagnosed? This condition is diagnosed based on the results of a physical exam that involves checking for an abnormal heart sound (heart murmur). Tests may be done, including:  An electrocardiogram (ECG). This test records your heart's electrical activity.  An echocardiogram. This test can show whether your left ventricle is enlarged and whether it fills slowly.  A Doppler test. This test shows irregular blood flow and pressure differences inside the heart.  A chest  X-ray. This test can show whether your heart is enlarged.  An MRI. How is this treated? Treatment for HCM depends on how severe your symptoms are. There are several options for treatment, including.  Medicine. Medicines can be given to: ? Reduce the workload of your heart. ? Lower your blood pressure. ? Thin your blood and prevent clots.  A device. Devices that can be used to treat this condition include: ? A pacemaker. This device helps to control your heartbeat. ? A defibrillator. This device restores a normal heart rhythm.  Surgery. This may include a procedure to: ? Inject alcohol into the small blood vessels that supply your heart muscle (alcohol septal ablation). This is done during a procedure called cardiac catheterization. The goal is to cause the muscle to become thinner. ? Remove part of the wall that divides the right and left sides of the heart (septum). ? Replace the mitral valve. Follow these instructions at home:  Avoid strenuous exercise and activities, like heavy lifting or shoveling snow.  Make sure the members of your household know how to do CPR in case of an emergency.  Take over-the-counter and prescription medicines only as told by your health care provider.  Follow a healthy diet and maintain a healthy weight. If you need help with losing weight, ask your health care provider.  Limit alcohol intake to no more than 1 drink per day for nonpregnant women and 2 drinks per day for men. One drink equals 12 oz of beer, 5 oz of wine, or 1 oz of hard liquor.  Do not use  any products that contain nicotine or tobacco, such as cigarettes and e-cigarettes. If you need help quitting, ask your health care provider.  Keep all follow-up visits as directed by your health care provider. This is important. Contact a health care provider if:  You have new symptoms.  Your symptoms get worse. Get help right away if:  You have chest pain or shortness of breath, especially  during or after sports.  You feel faint or you pass out.  You have trouble breathing even at rest.  Your feet or ankles swell.  Your heartbeat seems irregular or seems faster than normal (palpitations).  Your heartbeat seems abnormal. This information is not intended to replace advice given to you by your health care provider. Make sure you discuss any questions you have with your health care provider. Document Released: 05/19/2004 Document Revised: 05/06/2016 Document Reviewed: 05/06/2016 Elsevier Interactive Patient Education  2019 Elsevier Inc.   Metoprolol tablets What is this medicine? METOPROLOL (me TOE proe lole) is a beta-blocker. Beta-blockers reduce the workload on the heart and help it to beat more regularly. This medicine is used to treat high blood pressure and to prevent chest pain. It is also used to after a heart attack and to prevent an additional heart attack from occurring. This medicine may be used for other purposes; ask your health care provider or pharmacist if you have questions. COMMON BRAND NAME(S): Lopressor What should I tell my health care provider before I take this medicine? They need to know if you have any of these conditions: -diabetes -heart or vessel disease like slow heart rate, worsening heart failure, heart block, sick sinus syndrome or Raynaud's disease -kidney disease -liver disease -lung or breathing disease, like asthma or emphysema -pheochromocytoma -thyroid disease -an unusual or allergic reaction to metoprolol, other beta-blockers, medicines, foods, dyes, or preservatives -pregnant or trying to get pregnant -breast-feeding How should I use this medicine? Take this medicine by mouth with a drink of water. Follow the directions on the prescription label. Take this medicine immediately after meals. Take your doses at regular intervals. Do not take more medicine than directed. Do not stop taking this medicine suddenly. This could lead to  serious heart-related effects. Talk to your pediatrician regarding the use of this medicine in children. Special care may be needed. Overdosage: If you think you have taken too much of this medicine contact a poison control center or emergency room at once. NOTE: This medicine is only for you. Do not share this medicine with others. What if I miss a dose? If you miss a dose, take it as soon as you can. If it is almost time for your next dose, take only that dose. Do not take double or extra doses. What may interact with this medicine? This medicine may interact with the following medications: -certain medicines for blood pressure, heart disease, irregular heart beat -certain medicines for depression like monoamine oxidase (MAO) inhibitors, fluoxetine, or paroxetine -clonidine -dobutamine -epinephrine -isoproterenol -reserpine This list may not describe all possible interactions. Give your health care provider a list of all the medicines, herbs, non-prescription drugs, or dietary supplements you use. Also tell them if you smoke, drink alcohol, or use illegal drugs. Some items may interact with your medicine. What should I watch for while using this medicine? Visit your doctor or health care professional for regular check ups. Contact your doctor right away if your symptoms worsen. Check your blood pressure and pulse rate regularly. Ask your health care professional what  your blood pressure and pulse rate should be, and when you should contact them. You may get drowsy or dizzy. Do not drive, use machinery, or do anything that needs mental alertness until you know how this medicine affects you. Do not sit or stand up quickly, especially if you are an older patient. This reduces the risk of dizzy or fainting spells. Contact your doctor if these symptoms continue. Alcohol may interfere with the effect of this medicine. Avoid alcoholic drinks. What side effects may I notice from receiving this  medicine? Side effects that you should report to your doctor or health care professional as soon as possible: -allergic reactions like skin rash, itching or hives -cold or numb hands or feet -depression -difficulty breathing -faint -fever with sore throat -irregular heartbeat, chest pain -rapid weight gain -swollen legs or ankles Side effects that usually do not require medical attention (report to your doctor or health care professional if they continue or are bothersome): -anxiety or nervousness -change in sex drive or performance -dry skin -headache -nightmares or trouble sleeping -short term memory loss -stomach upset or diarrhea -unusually tired This list may not describe all possible side effects. Call your doctor for medical advice about side effects. You may report side effects to FDA at 1-800-FDA-1088. Where should I keep my medicine? Keep out of the reach of children. Store at room temperature between 15 and 30 degrees C (59 and 86 degrees F). Throw away any unused medicine after the expiration date. NOTE: This sheet is a summary. It may not cover all possible information. If you have questions about this medicine, talk to your doctor, pharmacist, or health care provider.  2019 Elsevier/Gold Standard (2013-02-15 14:40:36)

## 2018-08-16 NOTE — Progress Notes (Signed)
Samantha Price is a 58 y.o. female who presents to [place]: [place] today for follow-up chest pain and follow-up neck pain.  Samantha Price was seen 2 days ago.  At that time she had been complaining of episodes of palpitations associated with shortness of breath.  She notes that she is also been having some chest pain with these episodes as well.  She notes her pain is typically left-sided.  Pain is worse when she has episodes of palpitations or shortness of breath.  Pain can occur at rest and with exertion but is not typically exertional.  She notes she has some pain a little bit most of the time.  She was referred to cardiology and a Holter monitor and echocardiogram were ordered.  Holter monitor scheduled on March 3 and cardiology appointment scheduled March 4.  Echo still not scheduled.  2 days ago when I saw her for she noted that her brother had a cardiac condition that resulted in sudden cardiac collapse.  She contacted her brother and notes that he has hypertrophic cardiomyopathy and has an implanted defibrillator.    Additionally Samantha Price was seen for neck pain thought to be left-sided cervical radicular in nature.  She notes her neck pain is worsened and she is having posterior neck pain extending to the base of the skull.  This is worse with motion and better with rest.  She denies any recent injury.  She denies significant change to her radicular pain to her left arm.  She denies weakness or numbness distally.  ROS as above:  Exam:  BP (!) 147/83   Pulse 74   Ht 5\' 2"  (1.575 m)   Wt 165 lb (74.8 kg)   LMP 06/28/2011   BMI 30.18 kg/m  Wt Readings from Last 5 Encounters:  08/16/18 165 lb (74.8 kg)  08/14/18 169 lb (76.7 kg)  08/09/17 159 lb (72.1 kg)  04/20/17 159 lb (72.1 kg)  08/22/16 154 lb (69.9 kg)    Gen: Well NAD HEENT: EOMI,  MMM Lungs: Normal work of  breathing. CTABL Heart: RRR no soft nonradiating murmur. Abd: NABS, Soft. Nondistended, Nontender Exts: Brisk capillary refill, warm and well perfused.  C-spine: Nontender to spinal midline.  Tender palpation cervical paraspinal musculature. Decreased motion due to pain. Extremity strength is intact.  Lab and Radiology Results Results for orders placed or performed in visit on 08/14/18 (from the past 72 hour(s))  CBC     Status: None   Collection Time: 08/14/18 12:08 PM  Result Value Ref Range   WBC 9.1 3.8 - 10.8 Thousand/uL   RBC 4.10 3.80 - 5.10 Million/uL   Hemoglobin 12.3 11.7 - 15.5 g/dL   HCT 36.5 35.0 - 45.0 %   MCV 89.0 80.0 - 100.0 fL   MCH 30.0 27.0 - 33.0 pg   MCHC 33.7 32.0 - 36.0 g/dL   RDW 11.8 11.0 - 15.0 %   Platelets 381 140 - 400 Thousand/uL   MPV 9.9 7.5 - 12.5 fL  COMPLETE METABOLIC PANEL WITH GFR     Status: Abnormal   Collection Time: 08/14/18 12:08 PM  Result Value Ref Range   Glucose, Bld 134 (H) 65 - 99 mg/dL    Comment: .            Fasting reference interval . For someone without known diabetes, a glucose value >125 mg/dL indicates that they may have diabetes and this should be confirmed with  a follow-up test. .    BUN 15 7 - 25 mg/dL   Creat 1.11 (H) 0.50 - 1.05 mg/dL    Comment: For patients >80 years of age, the reference limit for Creatinine is approximately 13% higher for people identified as African-American. .    GFR, Est Non African American 54 (L) > OR = 60 mL/min/1.32m2   GFR, Est African American 63 > OR = 60 mL/min/1.55m2   BUN/Creatinine Ratio 14 6 - 22 (calc)   Sodium 139 135 - 146 mmol/L   Potassium 4.0 3.5 - 5.3 mmol/L   Chloride 103 98 - 110 mmol/L   CO2 28 20 - 32 mmol/L   Calcium 9.8 8.6 - 10.4 mg/dL   Total Protein 7.2 6.1 - 8.1 g/dL   Albumin 4.3 3.6 - 5.1 g/dL   Globulin 2.9 1.9 - 3.7 g/dL (calc)   AG Ratio 1.5 1.0 - 2.5 (calc)   Total Bilirubin 0.4 0.2 - 1.2 mg/dL   Alkaline phosphatase (APISO) 71 37 - 153 U/L     AST 16 10 - 35 U/L   ALT 13 6 - 29 U/L  LDL cholesterol, direct     Status: Abnormal   Collection Time: 08/14/18 12:08 PM  Result Value Ref Range   Direct LDL 113 (H) <100 mg/dL    Comment: Greatly elevated Triglycerides values (>1200 mg/dL) interfere with the dLDL assay. As no Triglycerides  testing was ordered, interpret results with caution. . Desirable range <100 mg/dL for primary prevention;   <70 mg/dL for patients with CHD or diabetic patients  with > or = 2 CHD risk factors. .   Hemoglobin A1c     Status: Abnormal   Collection Time: 08/14/18 12:08 PM  Result Value Ref Range   Hgb A1c MFr Bld 6.8 (H) <5.7 % of total Hgb    Comment: For someone without known diabetes, a hemoglobin A1c value of 6.5% or greater indicates that they may have  diabetes and this should be confirmed with a follow-up  test. . For someone with known diabetes, a value <7% indicates  that their diabetes is well controlled and a value  greater than or equal to 7% indicates suboptimal  control. A1c targets should be individualized based on  duration of diabetes, age, comorbid conditions, and  other considerations. . Currently, no consensus exists regarding use of hemoglobin A1c for diagnosis of diabetes for children. .    Mean Plasma Glucose 148 (calc)   eAG (mmol/L) 8.2 (calc)   Dg Chest 2 View  Result Date: 08/14/2018 CLINICAL DATA:  Pain and shortness of breath. EXAM: CHEST - 2 VIEW COMPARISON:  None. FINDINGS: Mild cardiomegaly. The hila and mediastinum are normal. No pneumothorax. No pulmonary nodules or masses. No focal infiltrates. IMPRESSION: No active cardiopulmonary disease. Electronically Signed   By: Dorise Bullion III M.D   On: 08/14/2018 17:15  I personally (independently) visualized and performed the interpretation of the images attached in this note.  X-ray images cervical spine personally independently reviewed. Degenerative changes at C5-C6.  Loss of cervical lordosis  indicating spasm. No fractures or severe degenerative changes present.    Assessment and Plan: 58 y.o. female with chest pain associate with palpitations and occasional shortness of breath with a family history of hypertrophic cardiomyopathy. Chest pain is a bit more concerning then my initial understanding on February 18.  However her symptoms are not particularly exertional and an EKG on the 18th was largely unremarkable and echocardiogram from 2017 did  not show signs concerning for hypertrophic cardiomyopathy.   Regardless plan to proceed with existing work-up including Holter monitor and echocardiogram.  Additionally follow-up with cardiology.  In the meantime however I will check troponin as that would certainly change our plan if elevated and will prescribe metoprolol.  Metoprolol should help reduce palpitations and hopefully will help reduce symptoms as well.  Additionally prescribed nitroglycerin for use as needed.  Neck pain: New onset posterior neck pain is very likely myofascial strain and spasm.  Plan to proceed with physical therapy.  Formal radiology read of cervical spine x-ray pending.  Patient also has cervical radicular pain down her left arm.  Some of her chest pain could be cervical radicular pain in the C5 or T1 dermatome.  Plan to treat with physical therapy and gabapentin.  Recheck in 1 week.  PDMP not reviewed this encounter. Orders Placed This Encounter  Procedures  . DG Cervical Spine Complete    Standing Status:   Future    Number of Occurrences:   1    Standing Expiration Date:   10/15/2019    Order Specific Question:   Reason for Exam (SYMPTOM  OR DIAGNOSIS REQUIRED)    Answer:   eval cervical pain    Order Specific Question:   Is patient pregnant?    Answer:   No    Order Specific Question:   Preferred imaging location?    Answer:   [facility]    Order Specific Question:   Radiology Contrast Protocol - do NOT remove file path    Answer:    [email or web address]  . Troponin T  . Ambulatory referral to Physical Therapy    Referral Priority:   Routine    Referral Type:   Physical Medicine    Referral Reason:   Specialty Services Required    Requested Specialty:   Physical Therapy   Meds ordered this encounter  Medications  . metoprolol tartrate (LOPRESSOR) 25 MG tablet    Sig: Take 1 tablet (25 mg total) by mouth 2 (two) times daily.    Dispense:  60 tablet    Refill:  3  . nitroGLYCERIN (NITROSTAT) 0.4 MG SL tablet    Sig: Place 1 tablet (0.4 mg total) under the tongue every 5 (five) minutes as needed for chest pain.    Dispense:  50 tablet    Refill:  3  . gabapentin (NEURONTIN) 300 MG capsule    Sig: One tab PO qHS for a week, then BID for a week, then TID. May double weekly to a max of 3,600mg /day    Dispense:  180 capsule    Refill:  3     Historical information moved to improve visibility of documentation.  Past Medical History:  Diagnosis Date  . Anemia   . Cyst    drained from Rt breast  . Diabetes mellitus    type 2  . Fibromyalgia   . Herniated disc   . Migraines   . Wears glasses    Past Surgical History:  Procedure Laterality Date  . APPENDECTOMY    . breast cyst excison     Left,   . COLONOSCOPY W/ POLYPECTOMY     2007, Dr. Oletta Lamas.    . TUBAL LIGATION     Social History   Tobacco Use  . Smoking status: Never Smoker  . Smokeless tobacco: Never Used  Substance Use Topics  . Alcohol use: Yes   family history includes Breast cancer in an other family  member; Cancer in her father; Colon cancer in an other family member; Diabetes in her mother; Heart disease in an other family member; Hypertrophic cardiomyopathy in her brother; Leukemia in an other family member; Stroke in an other family member.  Medications: Current Outpatient Medications  Medication Sig Dispense Refill  . AMBULATORY NON FORMULARY MEDICATION Medication Name: Glucometer with strips  and lancets to test 2-3 time per week. 1 Units PRN  . amLODipine (NORVASC) 10 MG tablet Take 1 tablet (10 mg total) by mouth daily. 90 tablet 1  . aspirin 81 MG tablet Take 81 mg by mouth daily.      . diclofenac sodium (VOLTAREN) 1 % GEL Apply 2 g topically 4 (four) times daily. To affected joint. 100 g 11  . Ginkgo Biloba 40 MG TABS Take 40 mg by mouth daily.      . JENTADUETO 2.10-998 MG TABS TAKE 1 TABLET BY MOUTH TWICE DAILY -  FINAL  REFILL.  APPOINTMENT  REQUIRED 180 tablet 0  . lisinopril-hydrochlorothiazide (PRINZIDE,ZESTORETIC) 20-12.5 MG tablet TAKE 1 TABLET BY MOUTH TWICE DAILY **  NEED  APPOINTMENT  FOR  REFILLS  ** 180 tablet 0  . Multiple Vitamin (MULTIVITAMIN PO) Take by mouth daily.      Marland Kitchen topiramate (TOPAMAX) 50 MG tablet Take 1 tablet (50 mg total) by mouth 2 (two) times daily. 180 tablet 1  . gabapentin (NEURONTIN) 300 MG capsule One tab PO qHS for a week, then BID for a week, then TID. May double weekly to a max of 3,600mg /day 180 capsule 3  . metoprolol tartrate (LOPRESSOR) 25 MG tablet Take 1 tablet (25 mg total) by mouth 2 (two) times daily. 60 tablet 3  . nitroGLYCERIN (NITROSTAT) 0.4 MG SL tablet Place 1 tablet (0.4 mg total) under the tongue every 5 (five) minutes as needed for chest pain. 50 tablet 3   No current facility-administered medications for this visit.    Allergies  Allergen Reactions  . Latex Rash     Discussed warning signs or symptoms. Please see discharge instructions. Patient expresses understanding.

## 2018-08-17 ENCOUNTER — Telehealth: Payer: Self-pay | Admitting: Family Medicine

## 2018-08-17 DIAGNOSIS — R079 Chest pain, unspecified: Secondary | ICD-10-CM

## 2018-08-17 LAB — TROPONIN I: TROPONIN I: 0.01 ng/mL (ref <=0.0)

## 2018-08-17 LAB — TROPONIN T

## 2018-08-17 NOTE — Telephone Encounter (Signed)
You're welcome!

## 2018-08-17 NOTE — Telephone Encounter (Signed)
Blood test done yesterday cannot be run stat. That is a send out test apparently. There is an equilivent test that can be done stat. The lab has canceled the lab test from yesterday. Please go to the lab this morning to get your blood test. This should come back today or tonight if we get it done this morning.

## 2018-08-22 ENCOUNTER — Ambulatory Visit (INDEPENDENT_AMBULATORY_CARE_PROVIDER_SITE_OTHER): Payer: BLUE CROSS/BLUE SHIELD | Admitting: Family Medicine

## 2018-08-22 VITALS — BP 122/64 | HR 79 | Wt 168.0 lb

## 2018-08-22 DIAGNOSIS — M542 Cervicalgia: Secondary | ICD-10-CM

## 2018-08-22 DIAGNOSIS — R079 Chest pain, unspecified: Secondary | ICD-10-CM

## 2018-08-22 NOTE — Patient Instructions (Addendum)
Thank you for coming in today.  Ok to take 1/2 pills of the metoprolol or just take at bedtime.  Ok to try to figure the balance between side effects and benefit.  Keep track of blood pressure.   OK to increase gabapentin.   Follow up with Cardiology as scheduled.   Follow up with Dr Madilyn Fireman.   Send FMLA forms to me.   Recheck with me in 1 month if ok.

## 2018-08-22 NOTE — Progress Notes (Signed)
Samantha Price is a 58 y.o. female who presents to [place]: [place] today for neck pain, and chest pain.  [name] was seen last week for palpitations and chest pain also with neck pain.  She had a history of tachypalpitations associated with some shortness of breath and chest pain.  Initial EKG was normal.  She was seen on the 20th and troponin was obtained which fortunately was negative.  She has been referred to cardiology and scheduled for echocardiogram and Holter monitor.  Her cardiology appointment is March 4.   She was also started on metoprolol 25 mg twice daily.  She notes this is helped considerably to improve her chest symptoms.  I does make her fatigued however.  She does not check her blood pressure at home.  She notes neck pain is still bothersome.  She does note the gabapentin has helped a little.  She is planning on scheduling with physical therapy in the near future.   ROS as above:  Exam:  BP 122/64   Pulse 79   Wt 168 lb (76.2 kg)   LMP 06/28/2011   BMI 30.73 kg/m  Wt Readings from Last 5 Encounters:  08/22/18 168 lb (76.2 kg)  08/16/18 165 lb (74.8 kg)  08/14/18 169 lb (76.7 kg)  08/09/17 159 lb (72.1 kg)  04/20/17 159 lb (72.1 kg)    Gen: Well NAD HEENT: EOMI,  MMM  Lungs: Normal work of breathing. CTABL Heart: RRR no MRG Abd: NABS, Soft. Nondistended, Nontender Exts: Brisk capillary refill, warm and well perfused.   Lab and Radiology Results Recent Results (from the past 2160 hour(s))  CBC     Status: None   Collection Time: 08/14/18 12:08 PM  Result Value Ref Range   WBC 9.1 3.8 - 10.8 Thousand/uL   RBC 4.10 3.80 - 5.10 Million/uL   Hemoglobin 12.3 11.7 - 15.5 g/dL   HCT 36.5 35.0 - 45.0 %   MCV 89.0 80.0 - 100.0 fL   MCH 30.0 27.0 - 33.0 pg   MCHC 33.7 32.0 - 36.0 g/dL   RDW 11.8 11.0 - 15.0 %   Platelets 381 140 - 400  Thousand/uL   MPV 9.9 7.5 - 12.5 fL  COMPLETE METABOLIC PANEL WITH GFR     Status: Abnormal   Collection Time: 08/14/18 12:08 PM  Result Value Ref Range   Glucose, Bld 134 (H) 65 - 99 mg/dL    Comment: .            Fasting reference interval . For someone without known diabetes, a glucose value >125 mg/dL indicates that they may have diabetes and this should be confirmed with a follow-up test. .    BUN 15 7 - 25 mg/dL   Creat 1.11 (H) 0.50 - 1.05 mg/dL    Comment: For patients >47 years of age, the reference limit for Creatinine is approximately 13% higher for people identified as African-American. .    GFR, Est Non African American 54 (L) > OR = 60 mL/min/1.75m2   GFR, Est African American 63 > OR = 60 mL/min/1.64m2   BUN/Creatinine Ratio 14 6 - 22 (calc)   Sodium 139 135 - 146 mmol/L   Potassium 4.0 3.5 - 5.3 mmol/L   Chloride 103 98 - 110 mmol/L   CO2 28 20 - 32 mmol/L   Calcium 9.8 8.6 - 10.4 mg/dL   Total Protein 7.2 6.1 - 8.1 g/dL  Albumin 4.3 3.6 - 5.1 g/dL   Globulin 2.9 1.9 - 3.7 g/dL (calc)   AG Ratio 1.5 1.0 - 2.5 (calc)   Total Bilirubin 0.4 0.2 - 1.2 mg/dL   Alkaline phosphatase (APISO) 71 37 - 153 U/L   AST 16 10 - 35 U/L   ALT 13 6 - 29 U/L  LDL cholesterol, direct     Status: Abnormal   Collection Time: 08/14/18 12:08 PM  Result Value Ref Range   Direct LDL 113 (H) <100 mg/dL    Comment: Greatly elevated Triglycerides values (>1200 mg/dL) interfere with the dLDL assay. As no Triglycerides  testing was ordered, interpret results with caution. . Desirable range <100 mg/dL for primary prevention;   <70 mg/dL for patients with CHD or diabetic patients  with > or = 2 CHD risk factors. .   Hemoglobin A1c     Status: Abnormal   Collection Time: 08/14/18 12:08 PM  Result Value Ref Range   Hgb A1c MFr Bld 6.8 (H) <5.7 % of total Hgb    Comment: For someone without known diabetes, a hemoglobin A1c value of 6.5% or greater indicates that they may have    diabetes and this should be confirmed with a follow-up  test. . For someone with known diabetes, a value <7% indicates  that their diabetes is well controlled and a value  greater than or equal to 7% indicates suboptimal  control. A1c targets should be individualized based on  duration of diabetes, age, comorbid conditions, and  other considerations. . Currently, no consensus exists regarding use of hemoglobin A1c for diagnosis of diabetes for children. .    Mean Plasma Glucose 148 (calc)   eAG (mmol/L) 8.2 (calc)  Troponin T     Status: None   Collection Time: 08/16/18 10:46 AM  Result Value Ref Range   Troponin T TROPT CANCELED     Comment: TEST NOT PERFORMED . No suitable specimen received.  Result canceled by the ancillary.   Troponin T     Status: None   Collection Time: 08/16/18 11:07 AM  Result Value Ref Range   Troponin T TROPT CANCELED     Comment: Test cancelled per client request.  Result canceled by the ancillary.   Troponin I     Status: None   Collection Time: 08/17/18  9:12 AM  Result Value Ref Range   Troponin I 0.01 < OR = 0.0 ng/mL    Comment: . In accord with published recommendations, serial testing of troponin I at intervals of 2 to 4 hours for up to 12 to 24 hours is suggested in order to corroborate a single troponin I result. An elevated troponin alone is not sufficient to make the diagnosis of MI. . . For additional information, please refer to  http://education.quest[employer].com/faq/FAQ202  (This link is being provided for informational/ educational purposes only.)    Dg Chest 2 View  Result Date: 08/14/2018 CLINICAL DATA:  Pain and shortness of breath. EXAM: CHEST - 2 VIEW COMPARISON:  None. FINDINGS: Mild cardiomegaly. The hila and mediastinum are normal. No pneumothorax. No pulmonary nodules or masses. No focal infiltrates. IMPRESSION: No active cardiopulmonary disease. Electronically Signed   By: Dorise Bullion III M.D   On:  08/14/2018 17:15   Dg Cervical Spine Complete  Result Date: 08/16/2018 CLINICAL DATA:  Neck pain and stiffness with tenderness 10 years. Symptoms worse over the past 3 weeks. EXAM: CERVICAL SPINE - COMPLETE 4+ VIEW COMPARISON:  04/13/2015 FINDINGS: Straightening of  the normal cervical lordosis. Mild spondylosis of the cervical spine. Vertebral body heights are normal. Mild disc space narrowing at the C5-6 level. Bilateral neural foraminal narrowing at the C5-6 level left worse than right without significant change. Uncovertebral joint spurring is present. Atlantoaxial articulation is normal. IMPRESSION: No acute findings. 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 Moderate bilateral neural foraminal narrowing at the C5-6 level left worse than right unchanged. Electronically Signed   By: Marin Olp M.D.   On: 08/16/2018 16:13   I personally (independently) visualized and performed the interpretation of the images attached in this note.    Assessment and Plan: 58 y.o. female with  Chest pain with palpitations.  Family history of hypertrophic cardiomyopathy.  Fortunately EKG and chest x-ray were not significantly abnormal.  Additionally labs so far have been relatively normal.  Plan to continue metoprolol.  Can decrease dose to half pills if that improves side effects and also control symptoms.  Follow-up with cardiology and primary care provider.  Neck pain likely cervical radicular pain.  Plan to proceed with physical therapy.  Recheck with me in 4 weeks.  Continue gabapentin.  PDMP not reviewed this encounter. No orders of the defined types were placed in this encounter.  No orders of the defined types were placed in this encounter.    Historical information moved to improve visibility of documentation.  Past Medical History:  Diagnosis Date  . Anemia   . Cyst    drained from Rt breast  . Diabetes mellitus    type 2  . Fibromyalgia   . Herniated  disc   . Migraines   . Wears glasses    Past Surgical History:  Procedure Laterality Date  . APPENDECTOMY    . breast cyst excison     Left,   . COLONOSCOPY W/ POLYPECTOMY     2007, Dr. Oletta Lamas.    . TUBAL LIGATION     Social History   Tobacco Use  . Smoking status: Never Smoker  . Smokeless tobacco: Never Used  Substance Use Topics  . Alcohol use: Yes   family history includes Breast cancer in an other family member; Cancer in her father; Colon cancer in an other family member; Diabetes in her mother; Heart disease in an other family member; Hypertrophic cardiomyopathy in her brother; Leukemia in an other family member; Stroke in an other family member.  Medications: Current Outpatient Medications  Medication Sig Dispense Refill  . AMBULATORY NON FORMULARY MEDICATION Medication Name: Glucometer with strips and lancets to test 2-3 time per week. 1 Units PRN  . amLODipine (NORVASC) 10 MG tablet Take 1 tablet (10 mg total) by mouth daily. 90 tablet 1  . aspirin 81 MG tablet Take 81 mg by mouth daily.      . diclofenac sodium (VOLTAREN) 1 % GEL Apply 2 g topically 4 (four) times daily. To affected joint. 100 g 11  . gabapentin (NEURONTIN) 300 MG capsule One tab PO qHS for a week, then BID for a week, then TID. May double weekly to a max of 3,600mg /day 180 capsule 3  . Ginkgo Biloba 40 MG TABS Take 40 mg by mouth daily.      . JENTADUETO 2.10-998 MG TABS TAKE 1 TABLET BY MOUTH TWICE DAILY -  FINAL  REFILL.  APPOINTMENT  REQUIRED 180 tablet 0  . lisinopril-hydrochlorothiazide (PRINZIDE,ZESTORETIC) 20-12.5 MG tablet TAKE 1 TABLET BY MOUTH TWICE DAILY **  NEED  APPOINTMENT  FOR  REFILLS  ** 180 tablet 0  . metoprolol tartrate (LOPRESSOR) 25 MG tablet Take 1 tablet (25 mg total) by mouth 2 (two) times daily. 60 tablet 3  . Multiple Vitamin (MULTIVITAMIN PO) Take by mouth daily.      . nitroGLYCERIN (NITROSTAT) 0.4 MG SL tablet Place 1 tablet (0.4 mg total) under the tongue every 5 (five)  minutes as needed for chest pain. 50 tablet 3  . topiramate (TOPAMAX) 50 MG tablet Take 1 tablet (50 mg total) by mouth 2 (two) times daily. 180 tablet 1   No current facility-administered medications for this visit.    Allergies  Allergen Reactions  . Latex Rash     Discussed warning signs or symptoms. Please see discharge instructions. Patient expresses understanding.

## 2018-08-28 ENCOUNTER — Ambulatory Visit (HOSPITAL_BASED_OUTPATIENT_CLINIC_OR_DEPARTMENT_OTHER)
Admission: RE | Admit: 2018-08-28 | Discharge: 2018-08-28 | Disposition: A | Discharge status: Home / Self Care | Payer: BLUE CROSS/BLUE SHIELD | Source: Ambulatory Visit | Attending: Family Medicine | Admitting: Family Medicine

## 2018-08-28 ENCOUNTER — Ambulatory Visit (INDEPENDENT_AMBULATORY_CARE_PROVIDER_SITE_OTHER): Payer: BLUE CROSS/BLUE SHIELD

## 2018-08-28 DIAGNOSIS — R0602 Shortness of breath: Secondary | ICD-10-CM | POA: Diagnosis not present

## 2018-08-28 DIAGNOSIS — R011 Cardiac murmur, unspecified: Secondary | ICD-10-CM | POA: Diagnosis present

## 2018-08-28 DIAGNOSIS — R002 Palpitations: Secondary | ICD-10-CM

## 2018-08-29 ENCOUNTER — Ambulatory Visit: Payer: BLUE CROSS/BLUE SHIELD | Admitting: Cardiology

## 2018-08-29 ENCOUNTER — Telehealth: Payer: Self-pay | Admitting: Family Medicine

## 2018-08-29 ENCOUNTER — Encounter: Payer: Self-pay | Admitting: Cardiology

## 2018-08-29 ENCOUNTER — Encounter: Payer: Self-pay | Admitting: Family Medicine

## 2018-08-29 VITALS — BP 142/80 | HR 58 | Ht 62.0 in | Wt 170.0 lb

## 2018-08-29 DIAGNOSIS — R0600 Dyspnea, unspecified: Secondary | ICD-10-CM

## 2018-08-29 DIAGNOSIS — Z8249 Family history of ischemic heart disease and other diseases of the circulatory system: Secondary | ICD-10-CM

## 2018-08-29 DIAGNOSIS — R160 Hepatomegaly, not elsewhere classified: Secondary | ICD-10-CM

## 2018-08-29 DIAGNOSIS — R0609 Other forms of dyspnea: Secondary | ICD-10-CM | POA: Diagnosis not present

## 2018-08-29 DIAGNOSIS — I10 Essential (primary) hypertension: Secondary | ICD-10-CM | POA: Diagnosis not present

## 2018-08-29 DIAGNOSIS — K7689 Other specified diseases of liver: Secondary | ICD-10-CM | POA: Insufficient documentation

## 2018-08-29 NOTE — Progress Notes (Signed)
Cardiology Office Note:    Date:  08/29/2018   ID:  Samantha Price, DOB August 17, 1960, MRN [id number]  PCP:  Samantha Marry, MD  Cardiologist:  Samantha Lindau, MD   Referring MD: Samantha Hams, MD    ASSESSMENT:    1. DOE (dyspnea on exertion)   2. Essential hypertension, benign   3. Family history of hypertrophic cardiomyopathy    PLAN:    In order of problems listed above:  1. Primary prevention stressed with the patient.  Importance of compliance with diet and medication stressed and she vocalized understanding.  Her blood pressure is stable.  Diet was discussed for obesity and risks of obesity explained and she plans to do better. 2. Her echocardiogram report was discussed with her.  She was also told about her cyst in the liver area and she will talk to her primary care physician about this. 3. In view of her palpitation she is a wearing a Holter monitor.  Her TSH was fine.  If this is unrevealing and she continues to have symptoms we will do a 2-week event monitoring. 4. She will undergo exercise stress echo to assess dyspnea on exertion.  She tells me that she is an active lady and plays tennis regularly. 5. Patient will be seen in follow-up appointment in 6 months or earlier if the patient has any concerns    Medication Adjustments/Labs and Tests Ordered: Current medicines are reviewed at length with the patient today.  Concerns regarding medicines are outlined above.  No orders of the defined types were placed in this encounter.  No orders of the defined types were placed in this encounter.    History of Present Illness:    Samantha Price is a 58 y.o. female who is being seen today for the evaluation of dyspnea on exertion at the request of Samantha Hams, MD.  Patient is a pleasant 58 year old female.  She has past medical history of essential hypertension.  She also has mild dyslipidemia.  She tells me that she had a brother who was diagnosed with  hypertrophic cardiomyopathy and is alive.  Subsequently she has had an echocardiogram done about a couple of years ago and it was fine.  She is here because of onset of palpitations on and off.  She also gives history of some dyspnea on exertion.  No orthopnea or PND.  No chest pain or chest tightness.  At the time of my evaluation, the patient is alert awake oriented and in no distress.  Past Medical History:  Diagnosis Date  . Anemia   . Cyst    drained from Rt breast  . Diabetes mellitus    type 2  . Fibromyalgia   . Herniated disc   . Migraines   . Wears glasses     Past Surgical History:  Procedure Laterality Date  . APPENDECTOMY    . breast cyst excison     Left,   . COLONOSCOPY W/ POLYPECTOMY     2007, Dr. Oletta Price.    . TUBAL LIGATION      Current Medications: Current Meds  Medication Sig  . AMBULATORY NON FORMULARY MEDICATION Medication Name: Glucometer with strips and lancets to test 2-3 time per week.  Marland Kitchen amLODipine (NORVASC) 10 MG tablet Take 1 tablet (10 mg total) by mouth daily.  Marland Kitchen aspirin 81 MG tablet Take 81 mg by mouth daily.    . diclofenac sodium (VOLTAREN) 1 % GEL Apply 2 g topically 4 (  four) times daily. To affected joint.  Marland Kitchen gabapentin (NEURONTIN) 300 MG capsule One tab PO qHS for a week, then BID for a week, then TID. May double weekly to a max of 3,600mg /day  . Ginkgo Biloba 40 MG TABS Take 40 mg by mouth daily.    . JENTADUETO 2.10-998 MG TABS TAKE 1 TABLET BY MOUTH TWICE DAILY -  FINAL  REFILL.  APPOINTMENT  REQUIRED  . lisinopril-hydrochlorothiazide (PRINZIDE,ZESTORETIC) 20-12.5 MG tablet TAKE 1 TABLET BY MOUTH TWICE DAILY **  NEED  APPOINTMENT  FOR  REFILLS  **  . metoprolol tartrate (LOPRESSOR) 25 MG tablet Take 1 tablet (25 mg total) by mouth 2 (two) times daily.  . Multiple Vitamin (MULTIVITAMIN PO) Take by mouth daily.    . nitroGLYCERIN (NITROSTAT) 0.4 MG SL tablet Place 1 tablet (0.4 mg total) under the tongue every 5 (five) minutes as needed for  chest pain.  Marland Kitchen topiramate (TOPAMAX) 50 MG tablet Take 1 tablet (50 mg total) by mouth 2 (two) times daily.     Allergies:   Latex   Social History   Socioeconomic History  . Marital status: Widowed    Spouse name: Samantha Price   . Number of children: 3  . Years of education: BA  . Highest education level: Not on file  Occupational History  . 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 Financial resource strain: Not on file  . Food insecurity:    Worry: Not on file    Inability: Not on file  . Transportation needs:    Medical: Not on file    Non-medical: Not on file  Tobacco Use  . Smoking status: Never Smoker  . Smokeless tobacco: Never Used  Substance and Sexual Activity  . Alcohol use: Yes  . Drug use: Yes  . Sexual activity: Not on file    Comment: widow, regularly exercises  Lifestyle  . Physical activity:    Days per week: Not on file    Minutes per session: Not on file  . Stress: Not on file  Relationships  . Social connections:    Talks on phone: Not on file    Gets together: Not on file    Attends religious service: Not on file    Active member of club or organization: Not on file    Attends meetings of clubs or organizations: Not on file    Relationship status: Not on file  Other Topics Concern  . Not on file  Social History Narrative    Has 3 daughters. All 3 out of the home now.   Her husband committed suicide in 03-Oct-2008 from PTSD, and mother died 56 months prior to that.  Mom died in a tragic accident.      Family History: The patient's family history includes Breast cancer in an other family member; Cancer in her father; Colon cancer in an other family member; Diabetes in her mother; Heart disease in an other family member; Hypertrophic cardiomyopathy in her brother; Leukemia in an other family member; Stroke in an other family member.  ROS:   Please see the history of present illness.      All other systems reviewed and are negative.  EKGs/Labs/Other Studies Reviewed:    The following studies were reviewed today: EKG reveals sinus rhythm and nonspecific ST-T changes.   Recent Labs: 08/14/2018: ALT 13; BUN 15; Creat 1.11; Hemoglobin 12.3; Platelets 381; Potassium 4.0; Sodium  139  Recent Lipid Panel    Component Value Date/Time   CHOL 216 (H) 08/09/2017 0919   TRIG 143 08/09/2017 0919   HDL 71 08/09/2017 0919   CHOLHDL 3.0 08/09/2017 0919   VLDL 36 (H) 09/18/2015 1646   LDLCALC 119 (H) 08/09/2017 0919   LDLDIRECT 113 (H) 08/14/2018 1208    Physical Exam:    VS:  BP (!) 142/80 (BP Location: Right Arm, Patient Position: Sitting, Cuff Size: Normal)   Pulse (!) 58   Ht 5\' 2"  (1.575 m)   Wt 170 lb (77.1 kg)   LMP 06/28/2011   SpO2 96%   BMI 31.09 kg/m     Wt Readings from Last 3 Encounters:  08/29/18 170 lb (77.1 kg)  08/22/18 168 lb (76.2 kg)  08/16/18 165 lb (74.8 kg)     GEN: Patient is in no acute distress HEENT: Normal NECK: No JVD; No carotid bruits LYMPHATICS: No lymphadenopathy CARDIAC: S1 S2 regular, 2/6 systolic murmur at the apex. RESPIRATORY:  Clear to auscultation without rales, wheezing or rhonchi  ABDOMEN: Soft, non-tender, non-distended MUSCULOSKELETAL:  No edema; No deformity  SKIN: Warm and dry NEUROLOGIC:  Alert and oriented x 3 PSYCHIATRIC:  Normal affect    Signed, Samantha Lindau, MD  08/29/2018 2:25 PM    [facility]

## 2018-08-29 NOTE — Patient Instructions (Signed)
Medication Instructions:  Your physician recommends that you continue on your current medications as directed. Please refer to the Current Medication list given to you today.  If you need a refill on your cardiac medications before your next appointment, please call your pharmacy.   Lab work: None.  If you have labs (blood work) drawn today and your tests are completely normal, you will receive your results only by: Marland Kitchen MyChart Message (if you have MyChart) OR . A paper copy in the mail If you have any lab test that is abnormal or we need to change your treatment, we will call you to review the results.  Testing/Procedures: Your physician has requested that you have a stress echocardiogram. For further information please visit [email or web address]. Please follow instruction sheet as given.    Follow-Up: At [facility], you and your health needs are our priority.  As part of our continuing mission to provide you with exceptional heart care, we have created designated Provider Care Teams.  These Care Teams include your primary Cardiologist (physician) and Advanced Practice Providers (APPs -  Physician Assistants and Nurse Practitioners) who all work together to provide you with the care you need, when you need it. You will need a follow up appointment in 3 months.  Please call our office 2 months in advance to schedule this appointment.  You may see No primary care provider on file. or another member of our [employer] in [place]: Jenne Campus, MD . Shirlee More, MD  Any Other Special Instructions Will Be Listed Below (If Applicable).   Exercise Stress Echocardiogram  An exercise stress echocardiogram is a test to check how well your heart is working. This test uses sound waves (ultrasound) and a computer to make images of your heart before and after exercise. Ultrasound images that are taken before you exercise (your resting echocardiogram) will show how much  blood is getting to your heart muscle and how well your heart muscle and heart valves are functioning. During the next part of this test, you will walk on a treadmill or ride a stationary bike to see how exercise affects your heart. While you exercise, the electrical activity of your heart will be monitored with an electrocardiogram (ECG). Your blood pressure will also be monitored. You may have this test if you:  Have chest pain or other symptoms of a heart problem.  Recently had a heart attack or heart surgery.  Have heart valve problems.  Have a condition that causes narrowing of the blood vessels that supply your heart (coronary artery disease).  Have a high risk of heart disease and are starting a new exercise program.  Have a high risk of heart disease and need to have major surgery. Tell a health care provider about:  Any allergies you have.  All medicines you are taking, including vitamins, herbs, eye drops, creams, and over-the-counter medicines.  Any problems you or family members have had with anesthetic medicines.  Any blood disorders you have.  Any surgeries you have had.  Any medical conditions you have.  Whether you are pregnant or may be pregnant. What are the risks? Generally, this is a safe procedure. However, problems may occur, including:  Chest pain.  Dizziness or light-headedness.  Shortness of breath.  Increased or irregular heartbeat (palpitations).  Nausea or vomiting.  Heart attack (very rare). What happens before the procedure?  Follow instructions from your health care provider about eating or drinking restrictions. You may be asked to  avoid all forms of caffeine for 24 hours before your procedure, or as told by your health care provider.  Ask your health care provider about changing or stopping your regular medicines. This is especially important if you are taking diabetes medicines or blood thinners.  If you use an inhaler, bring it with  you to the test.  Wear loose, comfortable clothing and walking shoes.  Do notuse any products that contain nicotine or tobacco, such as cigarettes and e-cigarettes, for 4 hours before the test or as told by your health care provider. If you need help quitting, ask your health care provider. What happens during the procedure?  You will take off your clothes from the waist up and put on a hospital gown.  A technician will place electrodes on your chest.  A blood pressure cuff will be placed on your arm.  You will lie down on a table for an ultrasound exam before you exercise. Gel will be rubbed on your chest, and a handheld device (transducer) will be pressed against your chest and moved over your heart.  Then, you will start exercising by walking on a treadmill or pedaling a stationary bicycle.  Your blood pressure and heart rhythm will be monitored while you exercise.  The exercise will gradually get harder or faster.  You will exercise until: ? Your heart reaches a target level. ? You are too tired to continue. ? You cannot continue because of chest pain, weakness, or dizziness.  You will have another ultrasound exam after you stop exercising. The procedure may vary among health care providers and hospitals. What happens after the procedure?  Your heart rate and blood pressure will be monitored until they return to your normal levels. Summary  An exercise stress echocardiogram is a test that uses ultrasound to check how well your heart works before and after exercise.  Before the test, follow instructions from your health care provider about stopping medications, avoiding nicotine and tobacco, and avoiding certain foods and drinks.  During the test, your blood pressure and heart rhythm will be monitored while you exercise on a treadmill or stationary bicycle. This information is not intended to replace advice given to you by your health care provider. Make sure you discuss any  questions you have with your health care provider. Document Released: 06/17/2004 Document Revised: 02/03/2016 Document Reviewed: 02/03/2016 Elsevier Interactive Patient Education  2019 [employer].

## 2018-08-29 NOTE — Telephone Encounter (Signed)
As noted on echocardiogram will follow-up liver cyst seen on echo.  Plan for abdominal ultrasound.

## 2018-08-31 ENCOUNTER — Ambulatory Visit (INDEPENDENT_AMBULATORY_CARE_PROVIDER_SITE_OTHER): Payer: BLUE CROSS/BLUE SHIELD

## 2018-08-31 DIAGNOSIS — R160 Hepatomegaly, not elsewhere classified: Secondary | ICD-10-CM

## 2018-09-03 ENCOUNTER — Encounter: Payer: Self-pay | Admitting: Family Medicine

## 2018-09-03 DIAGNOSIS — K824 Cholesterolosis of gallbladder: Secondary | ICD-10-CM | POA: Insufficient documentation

## 2018-09-04 ENCOUNTER — Ambulatory Visit: Payer: BLUE CROSS/BLUE SHIELD | Admitting: Family Medicine

## 2018-09-07 ENCOUNTER — Other Ambulatory Visit: Payer: Self-pay

## 2018-09-07 ENCOUNTER — Ambulatory Visit (HOSPITAL_BASED_OUTPATIENT_CLINIC_OR_DEPARTMENT_OTHER)
Admission: RE | Admit: 2018-09-07 | Discharge: 2018-09-07 | Disposition: A | Discharge status: Home / Self Care | Payer: BLUE CROSS/BLUE SHIELD | Source: Ambulatory Visit | Attending: Cardiology | Admitting: Cardiology

## 2018-09-07 DIAGNOSIS — Z8249 Family history of ischemic heart disease and other diseases of the circulatory system: Secondary | ICD-10-CM

## 2018-09-07 DIAGNOSIS — R0609 Other forms of dyspnea: Secondary | ICD-10-CM | POA: Diagnosis not present

## 2018-09-07 DIAGNOSIS — I10 Essential (primary) hypertension: Secondary | ICD-10-CM

## 2018-09-07 NOTE — Progress Notes (Signed)
  Echocardiogram Echocardiogram Stress Test has been performed.  Cardell Peach 09/07/2018, 12:00 PM

## 2018-09-10 ENCOUNTER — Other Ambulatory Visit: Payer: Self-pay

## 2018-09-10 ENCOUNTER — Ambulatory Visit (INDEPENDENT_AMBULATORY_CARE_PROVIDER_SITE_OTHER): Payer: BLUE CROSS/BLUE SHIELD | Admitting: Family Medicine

## 2018-09-10 ENCOUNTER — Encounter: Payer: Self-pay | Admitting: Family Medicine

## 2018-09-10 VITALS — BP 114/56 | HR 72 | Ht 62.0 in | Wt 167.0 lb

## 2018-09-10 DIAGNOSIS — Z23 Encounter for immunization: Secondary | ICD-10-CM | POA: Diagnosis not present

## 2018-09-10 DIAGNOSIS — Z0000 Encounter for general adult medical examination without abnormal findings: Secondary | ICD-10-CM | POA: Diagnosis not present

## 2018-09-10 DIAGNOSIS — Z1231 Encounter for screening mammogram for malignant neoplasm of breast: Secondary | ICD-10-CM | POA: Diagnosis not present

## 2018-09-10 MED ORDER — DICLOFENAC SODIUM 1 % TD GEL: 2.0000 g | Freq: Four times a day (QID) | TRANSDERMAL | 11 refills | Status: DC

## 2018-09-10 MED ORDER — SODIUM CHLORIDE 0.9 % IV SOLN
10.00 | INTRAVENOUS | Status: DC
Start: ? — End: 2018-09-10

## 2018-09-10 MED ORDER — TOPIRAMATE 50 MG PO TABS: 50.0000 mg | ORAL_TABLET | Freq: Two times a day (BID) | ORAL | 1 refills | Status: DC

## 2018-09-10 MED ORDER — GENERIC EXTERNAL MEDICATION
10.00 | Status: DC
Start: ? — End: 2018-09-10

## 2018-09-10 MED ORDER — LINAGLIPTIN-METFORMIN HCL 2.5-1000 MG PO TABS: ORAL_TABLET | ORAL | 1 refills | Status: DC

## 2018-09-10 MED ORDER — LISINOPRIL-HYDROCHLOROTHIAZIDE 20-12.5 MG PO TABS: ORAL_TABLET | ORAL | 1 refills | Status: DC

## 2018-09-10 MED ORDER — AMLODIPINE BESYLATE 10 MG PO TABS: 10.0000 mg | ORAL_TABLET | Freq: Every day | ORAL | 1 refills | Status: DC

## 2018-09-10 NOTE — Patient Instructions (Signed)

## 2018-09-10 NOTE — Progress Notes (Signed)
Subjective:     GLENIS MUSOLF is a 58 y.o. female and is here for a comprehensive physical exam. The patient reports no problems.  Did want to let me know that she actually went to [facility] emergency department yesterday for some left-sided chest pain and shortness of breath.  She took some nitroglycerin and says that her pain did improve.  She actually had a negative stress test and echo a couple of days prior on March 13.  It have a mildly elevated d-dimer levels so they did perform a CT pulmonary angiogram.  It was negative for pulmonary embolism.  Social History   Socioeconomic History  . Marital status: Widowed    Spouse name: Annie Main   . Number of children: 3  . Years of education: BA  . Highest education level: Not on file  Occupational History  . 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 Financial resource strain: Not on file  . Food insecurity:    Worry: Not on file    Inability: Not on file  . Transportation needs:    Medical: Not on file    Non-medical: Not on file  Tobacco Use  . Smoking status: Never Smoker  . Smokeless tobacco: Never Used  Substance and Sexual Activity  . Alcohol use: Yes  . Drug use: Yes  . Sexual activity: Not on file    Comment: widow, regularly exercises  Lifestyle  . Physical activity:    Days per week: Not on file    Minutes per session: Not on file  . Stress: Not on file  Relationships  . Social connections:    Talks on phone: Not on file    Gets together: Not on file    Attends religious service: Not on file    Active member of club or organization: Not on file    Attends meetings of clubs or organizations: Not on file    Relationship status: Not on file  . Intimate partner violence:    Fear of current or ex partner: Not on file    Emotionally abused: Not on file    Physically abused: Not on file    Forced sexual activity: Not on file  Other Topics  Concern  . Not on file  Social History Narrative    Has 3 daughters. All 3 out of the home now.   Her husband committed suicide in 2008-10-17 from PTSD, and mother died 65 months prior to that.  Mom died in a tragic accident.    Health Maintenance  Topic Date Due  . PNEUMOCOCCAL POLYSACCHARIDE VACCINE AGE 38-64 HIGH RISK  05/17/1961  . PAP SMEAR-Modifier  05/17/1980  . OPHTHALMOLOGY EXAM  02/25/2015  . COLONOSCOPY  10/04/2016  . MAMMOGRAM  06/17/2018  . FOOT EXAM  08/09/2018  . INFLUENZA VACCINE  09/25/2018 (Originally 01/25/2018)  . HEMOGLOBIN A1C  02/12/2019  . TETANUS/TDAP  11/25/2020  . Hepatitis C Screening  Completed  . HIV Screening  Completed    The following portions of the patient's history were reviewed and updated as appropriate: allergies, current medications, past family history, past medical history, past social history, past surgical history and problem list.  Review of Systems A comprehensive review of systems was negative.   Objective:    BP (!) 114/56   Pulse 72   Ht 5\' 2"  (1.575 m)   Wt 167 lb (75.8 kg)   LMP 06/28/2011  SpO2 100%   BMI 30.54 kg/m  General appearance: alert, cooperative and appears stated age Head: Normocephalic, without obvious abnormality, atraumatic Eyes: conj clear, EOMI, PEERLA Ears: normal TM's and external ear canals both ears Nose: Nares normal. Septum midline. Mucosa normal. No drainage or sinus tenderness. Throat: lips, mucosa, and tongue normal; teeth and gums normal Neck: no adenopathy, no carotid bruit, no JVD, supple, symmetrical, trachea midline and thyroid not enlarged, symmetric, no tenderness/mass/nodules Back: symmetric, no curvature. ROM normal. No CVA tenderness. Lungs: clear to auscultation bilaterally Breasts: normal appearance, no masses or tenderness Heart: regular rate and rhythm, S1, S2 normal, no murmur, click, rub or gallop Abdomen: soft, non-tender; bowel sounds normal; no masses,  no organomegaly Extremities:  extremities normal, atraumatic, no cyanosis or edema Pulses: 2+ and symmetric Skin: Skin color, texture, turgor normal. No rashes or lesions Lymph nodes: Cervical, supraclavicular, and axillary nodes normal. Neurologic: Alert and oriented X 3, normal strength and tone. Normal symmetric reflexes. Normal coordination and gait    Assessment:    Healthy female exam.      Plan:     See After Visit Summary for Counseling Recommendations   Keep up a regular exercise program and make sure you are eating a healthy diet Try to eat 4 servings of dairy a day, or if you are lactose intolerant take a calcium with vitamin D daily.  Your vaccines are up to date.  Please schedule mammogram.

## 2018-09-12 ENCOUNTER — Ambulatory Visit (INDEPENDENT_AMBULATORY_CARE_PROVIDER_SITE_OTHER): Payer: BLUE CROSS/BLUE SHIELD

## 2018-09-12 ENCOUNTER — Other Ambulatory Visit: Payer: Self-pay

## 2018-09-12 DIAGNOSIS — Z1231 Encounter for screening mammogram for malignant neoplasm of breast: Secondary | ICD-10-CM

## 2018-09-17 ENCOUNTER — Telehealth: Payer: Self-pay | Admitting: *Deleted

## 2018-09-17 DIAGNOSIS — R002 Palpitations: Secondary | ICD-10-CM

## 2018-09-17 NOTE — Telephone Encounter (Signed)
OK, order placed. Will print off

## 2018-09-17 NOTE — Telephone Encounter (Signed)
Pt reports that Dr. Madilyn Fireman told her that she was going to order a 2 week holter monitor for her. She did have the 3 day monitor done.. Will fwd to pcp for advice.Maryruth Eve, Lahoma Crocker, CMA

## 2018-09-19 ENCOUNTER — Telehealth: Payer: Self-pay | Admitting: *Deleted

## 2018-09-19 NOTE — Telephone Encounter (Signed)
Left message on pt's phone to call back about monitor being mailed.

## 2018-09-25 ENCOUNTER — Encounter: Payer: BLUE CROSS/BLUE SHIELD | Admitting: Family Medicine

## 2018-09-27 ENCOUNTER — Telehealth: Payer: BLUE CROSS/BLUE SHIELD | Admitting: Family

## 2018-09-27 DIAGNOSIS — R42 Dizziness and giddiness: Secondary | ICD-10-CM

## 2018-09-27 NOTE — Progress Notes (Signed)
Based on what you shared with me, I feel your condition warrants further evaluation and I recommend that you be seen for a face to face office visit.     NOTE: If you entered your credit card information for this eVisit, you will not be charged. You may see a "hold" on your card for the $35 but that hold will drop off and you will not have a charge processed.  If you are having a true medical emergency please call 911.  If you need an urgent face to face visit, [facility] has four urgent care centers for your convenience.    PLEASE NOTE: THE [facility] LOCATIONS AND URGENT CARE CLINICS DO NOT HAVE THE TESTING FOR CORONAVIRUS COVID19 AVAILABLE.  IF YOU FEEL YOU NEED THIS TEST YOU MUST GO TO A TRIAGE LOCATION AT [facility]   [email or web address] to reserve your spot online an avoid wait times  [facility] [address], [address] [place], [place] [address] 8 am to 8 pm Monday-Friday 10 am to 4 pm Saturday-Sunday *Across the street from [employer]  [place], [address] 8 am to 5 pm Monday-Friday * In the [facility] on the [facility]   The following sites will take your insurance:  . [facility] [place] a Provider at this Location  [address] [place], [place] [address] . 10 am to 8 pm Monday-Friday . 12 pm to 8 pm Saturday-Sunday   . [facility] Health Urgent Care at [place] a Provider at this Location  [place], [place], [place] [address] . 8 am to 8 pm Monday-Friday . 9 am to 6 pm Saturday . 11 am to 6 pm Sunday   . [facility] Urgent Care at [place] Get Driving Directions  [address] [place], [place] [address] . 8 am to 8 pm Monday-Friday . 8 am to 4 pm Saturday-Sunday   Your e-visit answers were  reviewed by a board certified advanced clinical practitioner to complete your personal care plan.  Thank you for using e-Visits.

## 2018-10-01 ENCOUNTER — Telehealth: Payer: Self-pay | Admitting: *Deleted

## 2018-10-01 ENCOUNTER — Telehealth (INDEPENDENT_AMBULATORY_CARE_PROVIDER_SITE_OTHER): Payer: BLUE CROSS/BLUE SHIELD | Admitting: Family Medicine

## 2018-10-01 ENCOUNTER — Encounter: Payer: Self-pay | Admitting: Family Medicine

## 2018-10-01 VITALS — Temp 97.9°F | Ht 62.0 in | Wt 167.0 lb

## 2018-10-01 DIAGNOSIS — R110 Nausea: Secondary | ICD-10-CM

## 2018-10-01 DIAGNOSIS — R05 Cough: Secondary | ICD-10-CM

## 2018-10-01 DIAGNOSIS — M549 Dorsalgia, unspecified: Secondary | ICD-10-CM | POA: Diagnosis not present

## 2018-10-01 DIAGNOSIS — R0602 Shortness of breath: Secondary | ICD-10-CM | POA: Diagnosis not present

## 2018-10-01 DIAGNOSIS — R059 Cough, unspecified: Secondary | ICD-10-CM

## 2018-10-01 MED ORDER — HYDROCODONE-HOMATROPINE 5-1.5 MG/5ML PO SYRP: 5.0000 mL | ORAL_SOLUTION | Freq: Every evening | ORAL | 0 refills | Status: DC | PRN

## 2018-10-01 MED ORDER — DOXYCYCLINE HYCLATE 100 MG PO TABS: 100.0000 mg | ORAL_TABLET | Freq: Two times a day (BID) | ORAL | 0 refills | Status: DC

## 2018-10-01 NOTE — Telephone Encounter (Signed)
Called pt to see if she has put her monitor on yet, not getting any reports. Pt stated she had just received monitor today, thinks it went to one of her neighbors by mistake and they left it on her doorstep. She will put it on today.

## 2018-10-01 NOTE — Progress Notes (Signed)
Pt reports sore throat started 1.5 wks ago using home remedies, she's had some chills, but no fever waking with headache. She stated that she has been having some problems when she breaths in her chest hurts. Her cough has been non productive. She hasn't looked at the back of her throat really to notice any white spots she said that it looks a little red but otherwise normal.  her headache is frontal and spreads. Her eyes get watery she took benadryl and aleve for this, no vision problems .Marland KitchenElouise Munroe, [place]

## 2018-10-01 NOTE — Progress Notes (Signed)
Virtual Visit via Video Note  I connected with Samantha Price on 10/01/18 at 10:10 AM EDT by a video enabled telemedicine application and verified that I am speaking with the correct person using two identifiers.   I discussed the limitations of evaluation and management by telemedicine and the availability of in person appointments. The patient expressed understanding and agreed to proceed.  Subjective:    CC: Cough and ST.   HPI:  58 year old female complains of dry cough and nausea x 1.5 weeks.  Tried salt water gargles and pushed fluids.  + ST.  No fever.  + chills. NO sweats.  She has a tickle in her throat.  Sometimes painful to swallow.  + HA and back aches.  "feels like a chest cold"  Some dizziness.  No ear pain.  No diarrhea or loose stools. Some mild SOB with activity.  Took some benadryl for allergies.  Didn't help. Tried Delsym.  Didn't help either.  The HA and back pain is keeping her up at night.  + very fatigued. + watery eyes.   Past medical history, Surgical history, Family history not pertinant except as noted below, Social history, Allergies, and medications have been entered into the medical record, reviewed, and corrections made.   Review of Systems: No fevers, chills, night sweats, weight loss, chest pain, or shortness of breath.   Objective:    General: Speaking clearly in complete sentences without any shortness of breath.  Alert and oriented x3.  Normal judgment. No apparent acute distress.    Impression and Recommendations:   Cough - viral vs bacterial. Since not getting better and unable to listen to her chest will tx with doxycycline.  Sent hydrocodone cough syrup as well. If not feeling better by the end of the week please let me know.  Call if fever or increased SOB occurs.  Recommend stay home and self quarantine until feeling better. She is working from home right now.   Back Pain - if pain not improving please let me know.     I discussed the  assessment and treatment plan with the patient. The patient was provided an opportunity to ask questions and all were answered. The patient agreed with the plan and demonstrated an understanding of the instructions.   The patient was advised to call back or seek an in-person evaluation if the symptoms worsen or if the condition fails to improve as anticipated.     Beatrice Lecher, MD

## 2018-10-03 ENCOUNTER — Encounter (INDEPENDENT_AMBULATORY_CARE_PROVIDER_SITE_OTHER): Payer: BLUE CROSS/BLUE SHIELD

## 2018-10-03 DIAGNOSIS — R002 Palpitations: Secondary | ICD-10-CM | POA: Diagnosis not present

## 2018-10-19 ENCOUNTER — Telehealth: Payer: Self-pay | Admitting: *Deleted

## 2018-10-19 ENCOUNTER — Telehealth: Payer: Self-pay | Admitting: Cardiology

## 2018-10-19 ENCOUNTER — Telehealth: Payer: BLUE CROSS/BLUE SHIELD | Admitting: Physician Assistant

## 2018-10-19 NOTE — Telephone Encounter (Signed)
Spoke with pt today about monitor. Says she is very itchy and irritated where the strip has been. Has been wearing for 3 weeks now. I instructed the pt to go ahead and send the monitor back, should have enough data by now. Use cortisone cream and Bendadryl for itching and irritation. Pt will do this and send monitor back today.

## 2018-10-19 NOTE — Telephone Encounter (Signed)
No message needed °

## 2018-10-19 NOTE — Telephone Encounter (Signed)
Please call patient back regarding her monitor  [phone]

## 2018-11-26 ENCOUNTER — Telehealth: Payer: Self-pay | Admitting: Cardiology

## 2018-11-26 ENCOUNTER — Telehealth: Payer: Self-pay | Admitting: *Deleted

## 2018-11-26 NOTE — Telephone Encounter (Signed)
YOUR CARDIOLOGY TEAM HAS ARRANGED FOR AN E-VISIT FOR YOUR APPOINTMENT - PLEASE REVIEW IMPORTANT INFORMATION BELOW SEVERAL DAYS PRIOR TO YOUR APPOINTMENT  Due to the recent COVID-19 pandemic, we are transitioning in-person office visits to tele-medicine visits in an effort to decrease unnecessary exposure to our patients, their families, and staff. These visits are billed to your insurance just like a normal visit is. We also encourage you to sign up for MyChart if you have not already done so. You will need a smartphone if possible. For patients that do not have this, we can still complete the visit using a regular telephone but do prefer a smartphone to enable video when possible. You may have a family member that lives with you that can help. If possible, we also ask that you have a blood pressure cuff and scale at home to measure your blood pressure, heart rate and weight prior to your scheduled appointment. Patients with clinical needs that need an in-person evaluation and testing will still be able to come to the office if absolutely necessary. If you have any questions, feel free to call our office.     YOUR PROVIDER WILL BE USING THE FOLLOWING PLATFORM TO COMPLETE YOUR VISIT: Staff: Please delete this text and fill in MyChart/[email or web address]  . IF USING MYCHART - How to Download the MyChart App to Your SmartPhone   - If Apple, go to App Store and type in MyChart in the search bar and download the app. If Android, ask patient to go to Google Play Store and type in MyChart in the search bar and download the app. The app is free but as with any other app downloads, your phone may require you to verify saved payment information or Apple/Android password.  - You will need to then log into the app with your MyChart username and password, and select [facility] as your healthcare provider to link the account.  - When it is time for your visit, go to the MyChart app, find appointments, and click Begin  Video Visit. Be sure to Select Allow for your device to access the Microphone and Camera for your visit. You will then be connected, and your provider will be with you shortly.  **If you have any issues connecting or need assistance, please contact MyChart service desk [phone] ([phone])**  **If using a computer, in order to ensure the best quality for your visit, you will need to use either of the following Internet Browsers: Google Chrome or Microsoft Edge**  . IF USING DOXIMITY or [email or web address].ME - The staff will give you instructions on receiving your link to join the meeting the day of your visit.      2-3 DAYS BEFORE YOUR APPOINTMENT  You will receive a telephone call from one of our [facility] team members - your caller ID may say "Unknown caller." If this is a video visit, we will walk you through how to get the video launched on your phone. We will remind you check your blood pressure, heart rate and weight prior to your scheduled appointment. If you have an Apple Watch or Kardia, please upload any pertinent ECG strips the day before or morning of your appointment to MyChart. Our staff will also make sure you have reviewed the consent and agree to move forward with your scheduled tele-health visit.     THE DAY OF YOUR APPOINTMENT  Approximately 15 minutes prior to your scheduled appointment, you will receive a telephone call from one of HeartCare team -   your caller ID may say "Unknown caller."  Our staff will confirm medications, vital signs for the day and any symptoms you may be experiencing. Please have this information available prior to the time of visit start. It may also be helpful for you to have a pad of paper and pen handy for any instructions given during your visit. They will also walk you through joining the smartphone meeting if this is a video visit.    CONSENT FOR TELE-HEALTH VISIT - PLEASE REVIEW  I hereby voluntarily request, consent and authorize [facility] and  its employed or contracted physicians, physician assistants, nurse practitioners or other licensed health care professionals (the Practitioner), to provide me with telemedicine health care services (the "Services") as deemed necessary by the treating Practitioner. I acknowledge and consent to receive the Services by the Practitioner via telemedicine. I understand that the telemedicine visit will involve communicating with the Practitioner through live audiovisual communication technology and the disclosure of certain medical information by electronic transmission. I acknowledge that I have been given the opportunity to request an in-person assessment or other available alternative prior to the telemedicine visit and am voluntarily participating in the telemedicine visit.  I understand that I have the right to withhold or withdraw my consent to the use of telemedicine in the course of my care at any time, without affecting my right to future care or treatment, and that the Practitioner or I may terminate the telemedicine visit at any time. I understand that I have the right to inspect all information obtained and/or recorded in the course of the telemedicine visit and may receive copies of available information for a reasonable fee.  I understand that some of the potential risks of receiving the Services via telemedicine include:  Marland Kitchen Delay or interruption in medical evaluation due to technological equipment failure or disruption; . Information transmitted may not be sufficient (e.g. poor resolution of images) to allow for appropriate medical decision making by the Practitioner; and/or  . In rare instances, security protocols could fail, causing a breach of personal health information.  Furthermore, I acknowledge that it is my responsibility to provide information about my medical history, conditions and care that is complete and accurate to the best of my ability. I acknowledge that Practitioner's advice,  recommendations, and/or decision may be based on factors not within their control, such as incomplete or inaccurate data provided by me or distortions of diagnostic images or specimens that may result from electronic transmissions. I understand that the practice of medicine is not an exact science and that Practitioner makes no warranties or guarantees regarding treatment outcomes. I acknowledge that I will receive a copy of this consent concurrently upon execution via email to the email address I last provided but may also request a printed copy by calling the office of [place].    I understand that my insurance will be billed for this visit.   I have read or had this consent read to me. . I understand the contents of this consent, which adequately explains the benefits and risks of the Services being provided via telemedicine.  . I have been provided ample opportunity to ask questions regarding this consent and the Services and have had my questions answered to my satisfaction. . I give my informed consent for the services to be provided through the use of telemedicine in my medical care  By participating in this telemedicine visit I agree to the above.Pt consents to virtual visit.

## 2018-11-26 NOTE — Telephone Encounter (Signed)
LM for patient to call the office to confirm if "ok" to do Virtual visit with Dr Clearence Ped on 11/30/2018. [facility]

## 2018-11-30 ENCOUNTER — Telehealth (INDEPENDENT_AMBULATORY_CARE_PROVIDER_SITE_OTHER): Payer: BC Managed Care – PPO | Admitting: Cardiology

## 2018-11-30 ENCOUNTER — Other Ambulatory Visit: Payer: Self-pay

## 2018-11-30 ENCOUNTER — Encounter: Payer: Self-pay | Admitting: Cardiology

## 2018-11-30 VITALS — Ht 62.0 in | Wt 169.0 lb

## 2018-11-30 DIAGNOSIS — E119 Type 2 diabetes mellitus without complications: Secondary | ICD-10-CM | POA: Diagnosis not present

## 2018-11-30 DIAGNOSIS — I5189 Other ill-defined heart diseases: Secondary | ICD-10-CM

## 2018-11-30 DIAGNOSIS — Z8249 Family history of ischemic heart disease and other diseases of the circulatory system: Secondary | ICD-10-CM

## 2018-11-30 DIAGNOSIS — I10 Essential (primary) hypertension: Secondary | ICD-10-CM | POA: Diagnosis not present

## 2018-11-30 NOTE — Progress Notes (Signed)
Virtual Visit via Telephone Note   This visit type was conducted due to national recommendations for restrictions regarding the COVID-19 Pandemic (e.g. social distancing) in an effort to limit this patient's exposure and mitigate transmission in our community.  Due to her co-morbid illnesses, this patient is at least at moderate risk for complications without adequate follow up.  This format is felt to be most appropriate for this patient at this time.  The patient did not have access to video technology/had technical difficulties with video requiring transitioning to audio format only (telephone).  All issues noted in this document were discussed and addressed.  No physical exam could be performed with this format.  Please refer to the patient's chart for her  consent to telehealth for [facility].   Date:  11/30/2018   ID:  Samantha Price, DOB Jul 29, 1960, MRN [id number]  Patient Location: Home Provider Location: Home  PCP:  Hali Marry, MD  Cardiologist:  No primary care provider on file.  Electrophysiologist:  None   Evaluation Performed:  Follow-Up Visit  Chief Complaint:  DOE follow up  History of Present Illness:    Samantha Price is a 58 y.o. female with   The patient does not have symptoms concerning for COVID-19 infection (fever, chills, cough, or new shortness of breath).    Past Medical History:  Diagnosis Date  . Anemia   . Cyst    drained from Rt breast  . Diabetes mellitus    type 2  . Fibromyalgia   . Herniated disc   . Migraines   . Wears glasses    Past Surgical History:  Procedure Laterality Date  . APPENDECTOMY    . breast cyst excison     Left,   . COLONOSCOPY W/ POLYPECTOMY     2007, Dr. Oletta Lamas.    . TUBAL LIGATION       Current Meds  Medication Sig  . AMBULATORY NON FORMULARY MEDICATION Medication Name: Glucometer with strips and lancets to test 2-3 time per week.  Marland Kitchen amLODipine (NORVASC) 10 MG tablet Take 1 tablet (10  mg total) by mouth daily.  Marland Kitchen aspirin 81 MG tablet Take 81 mg by mouth daily.    . diclofenac sodium (VOLTAREN) 1 % GEL Apply 2 g topically 4 (four) times daily. To affected joint.  Marland Kitchen doxycycline (VIBRA-TABS) 100 MG tablet Take 1 tablet (100 mg total) by mouth 2 (two) times daily. (Patient taking differently: Take 100 mg by mouth as needed. )  . gabapentin (NEURONTIN) 300 MG capsule One tab PO qHS for a week, then BID for a week, then TID. May double weekly to a max of 3,600mg /day  . linaGLIPtin-metFORMIN HCl (JENTADUETO) 2.10-998 MG TABS TAKE 1 TABLET BY MOUTH TWICE DAILY  . lisinopril-hydrochlorothiazide (PRINZIDE,ZESTORETIC) 20-12.5 MG tablet TAKE 1 TABLET BY MOUTH TWICE DAILY  . metoprolol tartrate (LOPRESSOR) 25 MG tablet Take 1 tablet (25 mg total) by mouth 2 (two) times daily.  . Multiple Vitamin (MULTIVITAMIN PO) Take by mouth daily.    . nitroGLYCERIN (NITROSTAT) 0.4 MG SL tablet Place 1 tablet (0.4 mg total) under the tongue every 5 (five) minutes as needed for chest pain.  Marland Kitchen topiramate (TOPAMAX) 50 MG tablet Take 1 tablet (50 mg total) by mouth 2 (two) times daily.  . [DISCONTINUED] HYDROcodone-homatropine (HYCODAN) 5-1.5 MG/5ML syrup Take 5 mLs by mouth at bedtime as needed for cough.     Allergies:   Latex   Social History   Tobacco Use  .  Smoking status: Never Smoker  . Smokeless tobacco: Never Used  Substance Use Topics  . Alcohol use: Yes  . Drug use: Yes     Family Hx: The patient's family history includes Breast cancer in an other family member; Cancer in her father; Colon cancer in an other family member; Diabetes in her mother; Heart disease in an other family member; Hypertrophic cardiomyopathy in her brother; Leukemia in an other family member; Stroke in an other family member.  ROS:   Please see the history of present illness.    As mentioned above All other systems reviewed and are negative.   Prior CV studies:   The following studies were reviewed today:   I discussed findings and stress test with the patient at length  Labs/Other Tests and Data Reviewed:    EKG:  No ECG reviewed.  Recent Labs: 08/14/2018: ALT 13; BUN 15; Creat 1.11; Hemoglobin 12.3; Platelets 381; Potassium 4.0; Sodium 139   Recent Lipid Panel Lab Results  Component Value Date/Time   CHOL 216 (H) 08/09/2017 09:19 AM   TRIG 143 08/09/2017 09:19 AM   HDL 71 08/09/2017 09:19 AM   CHOLHDL 3.0 08/09/2017 09:19 AM   LDLCALC 119 (H) 08/09/2017 09:19 AM   LDLDIRECT 113 (H) 08/14/2018 12:08 PM    Wt Readings from Last 3 Encounters:  11/30/18 169 lb (76.7 kg)  10/01/18 167 lb (75.8 kg)  09/10/18 167 lb (75.8 kg)     Objective:    Vital Signs:  Ht 5\' 2"  (1.575 m)   Wt 169 lb (76.7 kg)   LMP 06/28/2011   BMI 30.91 kg/m    VITAL SIGNS:  reviewed  ASSESSMENT & PLAN:    1. Essential hypertension: Blood pressure stable.  Her shortness of breath is resolved and she walks her dogs on a regular basis. 2. Diabetes mellitus: Diet was discussed.  She is very compliant with exercise.  Importance of statins emphasized and she is agreeable to consider it.  I would like her to go to her primary care doctor and get a Chem-7 liver lipid check.  She lives far away from our office and wants to get it done at her primary care doctor's office.  Once we have those numbers we will advise her accordingly. 3. Patient will be seen in follow-up appointment in 6 months or earlier if the patient has any concerns   COVID-19 Education: The signs and symptoms of COVID-19 were discussed with the patient and how to seek care for testing (follow up with PCP or arrange E-visit).  The importance of social distancing was discussed today.  Time:   Today, I have spent 14 minutes with the patient with telehealth technology discussing the above problems.     Medication Adjustments/Labs and Tests Ordered: Current medicines are reviewed at length with the patient today.  Concerns regarding medicines are  outlined above.   Tests Ordered: No orders of the defined types were placed in this encounter.   Medication Changes: No orders of the defined types were placed in this encounter.   Disposition:  Follow up 58mo  Signed, Jenean Lindau, MD  11/30/2018 11:15 AM    [place]

## 2018-11-30 NOTE — Patient Instructions (Addendum)
Medication Instructions:  Your physician recommends that you continue on your current medications as directed. Please refer to the Current Medication list given to you today.  If you need a refill on your cardiac medications before your next appointment, please call your pharmacy.   Lab work: NONE If you have labs (blood work) drawn today and your tests are completely normal, you will receive your results only by: Marland Kitchen MyChart Message (if you have MyChart) OR . A paper copy in the mail If you have any lab test that is abnormal or we need to change your treatment, we will call you to review the results.  Testing/Procedures: NONE  Follow-Up: At [facility], you and your health needs are our priority.  As part of our continuing mission to provide you with exceptional heart care, we have created designated Provider Care Teams.  These Care Teams include your primary Cardiologist (physician) and Advanced Practice Providers (APPs -  Physician Assistants and Nurse Practitioners) who all work together to provide you with the care you need, when you need it. You will need a follow up appointment in 6 months.  PATIENT states she would like to call back and schedule follow up appt.

## 2018-12-10 ENCOUNTER — Ambulatory Visit: Payer: BC Managed Care – PPO | Admitting: Family Medicine

## 2018-12-10 ENCOUNTER — Encounter: Payer: Self-pay | Admitting: Family Medicine

## 2018-12-10 VITALS — BP 150/89 | HR 87 | Ht 62.0 in | Wt 164.0 lb

## 2018-12-10 DIAGNOSIS — E119 Type 2 diabetes mellitus without complications: Secondary | ICD-10-CM

## 2018-12-10 DIAGNOSIS — I10 Essential (primary) hypertension: Secondary | ICD-10-CM | POA: Diagnosis not present

## 2018-12-10 DIAGNOSIS — R202 Paresthesia of skin: Secondary | ICD-10-CM | POA: Diagnosis not present

## 2018-12-10 LAB — POCT GLYCOSYLATED HEMOGLOBIN (HGB A1C): Hemoglobin A1C: 5.8 % — AB (ref 4.0–5.6)

## 2018-12-10 NOTE — Progress Notes (Signed)
Established Patient Office Visit  Subjective:  Patient ID: Samantha Price, female    DOB: August 13, 1960  Age: 58 y.o. MRN: [id number]  CC:  Chief Complaint  Patient presents with  . Diabetes    numbness in fingers,feet and arms. reports that the past few days its been constant.  . Hypertension    HPI Samantha Price presents for numbness and tingling in her hands and feet.  She says it started maybe a week ago.  But she also reports that she has been taking an extra tablet Topamax daily because she been having more frequent headaches.  She went down to visit her new granddaughter in [place] and stayed for the week and says it was just really hot and humid there and felt like that was likely triggering her migraines.  She is not vegetarian she does consume meat.  No other changes to her medications or regimens.  She did follow-up with cardiology this week and did a virtual visit they are asking that we do up-to-date blood work on her including up-to-date lipids.  Diabetes - no hypoglycemic events. No wounds or sores that are not healing well. No increased thirst or urination. Checking glucose at home. Taking medications as prescribed without any side effects.  Hypertension- Pt denies chest pain, SOB, dizziness, or heart palpitations.  Taking meds as directed w/o problems.  Denies medication side effects.      Past Medical History:  Diagnosis Date  . Anemia   . Cyst    drained from Rt breast  . Diabetes mellitus    type 2  . Fibromyalgia   . Herniated disc   . Migraines   . Wears glasses     Past Surgical History:  Procedure Laterality Date  . APPENDECTOMY    . breast cyst excison     Left,   . COLONOSCOPY W/ POLYPECTOMY     2007, Dr. Oletta Lamas.    . TUBAL LIGATION      Family History  Problem Relation Age of Onset  . Diabetes Mother   . Cancer Father        stomach  . Hypertrophic cardiomyopathy Brother   . Colon cancer Other        aunt, cousin  .  Heart disease Other        family hisatory  . Leukemia Other        1st cousin  . Breast cancer Other        aunt  . Stroke Other        Grandparent    Social History   Socioeconomic History  . Marital status: Widowed    Spouse name: Annie Main   . Number of children: 3  . Years of education: BA  . Highest education level: Not on file  Occupational History  . 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 Financial resource strain: Not on file  . Food insecurity    Worry: Not on file    Inability: Not on file  . Transportation needs    Medical: Not on file    Non-medical: Not on file  Tobacco Use  . Smoking status: Never Smoker  . Smokeless tobacco: Never Used  Substance and Sexual Activity  . Alcohol use: Yes  . Drug use: Yes  . Sexual activity: Not on file    Comment: widow, regularly exercises  Lifestyle  . Physical activity  Days per week: Not on file    Minutes per session: Not on file  . Stress: Not on file  Relationships  . Social Herbalist on phone: Not on file    Gets together: Not on file    Attends religious service: Not on file    Active member of club or organization: Not on file    Attends meetings of clubs or organizations: Not on file    Relationship status: Not on file  . Intimate partner violence    Fear of current or ex partner: Not on file    Emotionally abused: Not on file    Physically abused: Not on file    Forced sexual activity: Not on file  Other Topics Concern  . Not on file  Social History Narrative    Has 3 daughters. All 3 out of the home now.   Her husband committed suicide in 2008-10-05 from PTSD, and mother died 6 months prior to that.  Mom died in a tragic accident.     Outpatient Medications Prior to Visit  Medication Sig Dispense Refill  . AMBULATORY NON FORMULARY MEDICATION Medication Name: Glucometer with strips and lancets to test 2-3 time per week.  1 Units PRN  . amLODipine (NORVASC) 10 MG tablet Take 1 tablet (10 mg total) by mouth daily. 90 tablet 1  . aspirin 81 MG tablet Take 81 mg by mouth daily.      . diclofenac sodium (VOLTAREN) 1 % GEL Apply 2 g topically 4 (four) times daily. To affected joint. 100 g 11  . gabapentin (NEURONTIN) 300 MG capsule One tab PO qHS for a week, then BID for a week, then TID. May double weekly to a max of 3,600mg /day 180 capsule 3  . linaGLIPtin-metFORMIN HCl (JENTADUETO) 2.10-998 MG TABS TAKE 1 TABLET BY MOUTH TWICE DAILY 180 tablet 1  . lisinopril-hydrochlorothiazide (PRINZIDE,ZESTORETIC) 20-12.5 MG tablet TAKE 1 TABLET BY MOUTH TWICE DAILY 180 tablet 1  . metoprolol tartrate (LOPRESSOR) 25 MG tablet Take 1 tablet (25 mg total) by mouth 2 (two) times daily. 60 tablet 3  . Multiple Vitamin (MULTIVITAMIN PO) Take by mouth daily.      . nitroGLYCERIN (NITROSTAT) 0.4 MG SL tablet Place 1 tablet (0.4 mg total) under the tongue every 5 (five) minutes as needed for chest pain. 50 tablet 3  . topiramate (TOPAMAX) 50 MG tablet Take 1 tablet (50 mg total) by mouth 2 (two) times daily. 180 tablet 1  . doxycycline (VIBRA-TABS) 100 MG tablet Take 1 tablet (100 mg total) by mouth 2 (two) times daily. (Patient taking differently: Take 100 mg by mouth as needed. ) 20 tablet 0   No facility-administered medications prior to visit.     Allergies  Allergen Reactions  . Latex Rash    ROS Review of Systems    Objective:    Physical Exam  Constitutional: She is oriented to person, place, and time. She appears well-developed and well-nourished.  HENT:  Head: Normocephalic and atraumatic.  Cardiovascular: Normal rate, regular rhythm and normal heart sounds.  Pulmonary/Chest: Effort normal and breath sounds normal.  Musculoskeletal:     Comments: Normal movement of both hands and fingers.  Neurological: She is alert and oriented to person, place, and time.  Skin: Skin is warm and dry.  Psychiatric: She has a  normal mood and affect. Her behavior is normal.    BP (!) 150/89   Pulse 87   Ht 5\' 2"  (1.575 m)  Wt 164 lb (74.4 kg)   LMP 06/28/2011   SpO2 100%   BMI 30.00 kg/m  Wt Readings from Last 3 Encounters:  12/10/18 164 lb (74.4 kg)  11/30/18 169 lb (76.7 kg)  10/01/18 167 lb (75.8 kg)     Health Maintenance Due  Topic Date Due  . PAP SMEAR-Modifier  05/17/1980  . OPHTHALMOLOGY EXAM  02/25/2015  . COLONOSCOPY  10/04/2016  . FOOT EXAM  08/09/2018    There are no preventive care reminders to display for this patient.  Lab Results  Component Value Date   TSH 0.95 08/09/2017   Lab Results  Component Value Date   WBC 9.1 08/14/2018   HGB 12.3 08/14/2018   HCT 36.5 08/14/2018   MCV 89.0 08/14/2018   PLT 381 08/14/2018   Lab Results  Component Value Date   NA 139 08/14/2018   K 4.0 08/14/2018   CO2 28 08/14/2018   GLUCOSE 134 (H) 08/14/2018   BUN 15 08/14/2018   CREATININE 1.11 (H) 08/14/2018   BILITOT 0.4 08/14/2018   ALKPHOS 51 09/18/2015   AST 16 08/14/2018   ALT 13 08/14/2018   PROT 7.2 08/14/2018   ALBUMIN 4.0 09/18/2015   CALCIUM 9.8 08/14/2018   Lab Results  Component Value Date   CHOL 216 (H) 08/09/2017   Lab Results  Component Value Date   HDL 71 08/09/2017   Lab Results  Component Value Date   LDLCALC 119 (H) 08/09/2017   Lab Results  Component Value Date   TRIG 143 08/09/2017   Lab Results  Component Value Date   CHOLHDL 3.0 08/09/2017   Lab Results  Component Value Date   HGBA1C 5.8 (A) 12/10/2018      Assessment & Plan:   Problem List Items Addressed This Visit      Cardiovascular and Mediastinum   Essential hypertension, benign - Primary    Well controlled. Continue current regimen. Follow up in  6 months.       Relevant Orders   Lipid Panel w/reflex Direct LDL   COMPLETE METABOLIC PANEL WITH GFR   POCT glycosylated hemoglobin (Hb A1C) (Completed)   B12 and Folate Panel   Vitamin B6   Vitamin B1   Zinc   Ferritin    CBC     Endocrine   Diabetes type 2, controlled ([place])    She has made an enormous improvement in her A1c at flow slightly fantastic.  Continue current regimen and continue with exercise.  Follow-up in 4 months.      Relevant Orders   Lipid Panel w/reflex Direct LDL   COMPLETE METABOLIC PANEL WITH GFR   POCT glycosylated hemoglobin (Hb A1C) (Completed)   B12 and Folate Panel   Vitamin B6   Vitamin B1   Zinc   Ferritin   CBC    Other Visit Diagnoses    Paresthesia       Relevant Orders   Lipid Panel w/reflex Direct LDL   COMPLETE METABOLIC PANEL WITH GFR   POCT glycosylated hemoglobin (Hb A1C) (Completed)   B12 and Folate Panel   Vitamin B6   Vitamin B1   Zinc   Ferritin   CBC     Paresthesias-I suspect it is probably from the increased dose of Topamax especially since this happening in the hands and the feet and it started more recently over the last week.  Socially without any injury to the neck or spine.  We will also check for some deficiencies such  as B12 etc.  If everything is normal and she is able to go back on her Topamax and the symptoms persist then consider further work-up.  No orders of the defined types were placed in this encounter.   Follow-up: Return in about 4 months (around 04/11/2019).    Beatrice Lecher, MD

## 2018-12-10 NOTE — Assessment & Plan Note (Signed)
She has made an enormous improvement in her A1c at flow slightly fantastic.  Continue current regimen and continue with exercise.  Follow-up in 4 months.

## 2018-12-10 NOTE — Assessment & Plan Note (Signed)
Well controlled. Continue current regimen. Follow up in  6 months.  

## 2018-12-20 LAB — CBC
HCT: 38.5 % (ref 35.0–45.0)
Hemoglobin: 12.7 g/dL (ref 11.7–15.5)
MCH: 30 pg (ref 27.0–33.0)
MCHC: 33 g/dL (ref 32.0–36.0)
MCV: 90.8 fL (ref 80.0–100.0)
MPV: 9.8 fL (ref 7.5–12.5)
Platelets: 351 10*3/uL (ref 140–400)
RBC: 4.24 10*6/uL (ref 3.80–5.10)
RDW: 11.8 % (ref 11.0–15.0)
WBC: 7.2 10*3/uL (ref 3.8–10.8)

## 2018-12-20 LAB — ZINC: Zinc: 85 ug/dL (ref 60–130)

## 2018-12-20 LAB — LIPID PANEL W/REFLEX DIRECT LDL
Cholesterol: 203 mg/dL — ABNORMAL HIGH (ref <200)
HDL: 64 mg/dL (ref >=50)
LDL Cholesterol (Calc): 111 mg/dL (calc) — ABNORMAL HIGH
Non-HDL Cholesterol (Calc): 139 mg/dL (calc) — ABNORMAL HIGH (ref <130)
Total CHOL/HDL Ratio: 3.2 (calc) (ref <5.0)
Triglycerides: 164 mg/dL — ABNORMAL HIGH (ref <150)

## 2018-12-20 LAB — COMPLETE METABOLIC PANEL WITH GFR
AG Ratio: 1.6 (calc) (ref 1.0–2.5)
ALT: 15 U/L (ref 6–29)
AST: 19 U/L (ref 10–35)
Albumin: 4.6 g/dL (ref 3.6–5.1)
Alkaline phosphatase (APISO): 57 U/L (ref 37–153)
BUN: 12 mg/dL (ref 7–25)
CO2: 26 mmol/L (ref 20–32)
Calcium: 10.4 mg/dL (ref 8.6–10.4)
Chloride: 105 mmol/L (ref 98–110)
Creat: 0.97 mg/dL (ref 0.50–1.05)
GFR, Est African American: 74 mL/min/{1.73_m2} (ref >=60)
GFR, Est Non African American: 64 mL/min/{1.73_m2} (ref >=60)
Globulin: 2.8 g/dL (calc) (ref 1.9–3.7)
Glucose, Bld: 106 mg/dL — ABNORMAL HIGH (ref 65–99)
Potassium: 4 mmol/L (ref 3.5–5.3)
Sodium: 142 mmol/L (ref 135–146)
Total Bilirubin: 0.5 mg/dL (ref 0.2–1.2)
Total Protein: 7.4 g/dL (ref 6.1–8.1)

## 2018-12-20 LAB — VITAMIN B1: Vitamin B1 (Thiamine): 10 nmol/L (ref 8–30)

## 2018-12-20 LAB — FERRITIN: Ferritin: 73 ng/mL (ref 16–232)

## 2018-12-20 LAB — B12 AND FOLATE PANEL
Folate: >24 ng/mL
Vitamin B-12: 385 pg/mL (ref 200–1100)

## 2018-12-20 LAB — VITAMIN B6: Vitamin B6: 34.8 ng/mL — ABNORMAL HIGH (ref 2.1–21.7)

## 2019-04-12 ENCOUNTER — Ambulatory Visit: Payer: BC Managed Care – PPO | Admitting: Family Medicine

## 2019-05-03 ENCOUNTER — Other Ambulatory Visit: Payer: Self-pay

## 2019-05-03 ENCOUNTER — Ambulatory Visit (INDEPENDENT_AMBULATORY_CARE_PROVIDER_SITE_OTHER): Payer: BC Managed Care – PPO | Admitting: Family Medicine

## 2019-05-03 ENCOUNTER — Encounter: Payer: Self-pay | Admitting: Family Medicine

## 2019-05-03 VITALS — BP 151/72 | HR 83 | Ht 62.0 in | Wt 167.0 lb

## 2019-05-03 DIAGNOSIS — E119 Type 2 diabetes mellitus without complications: Secondary | ICD-10-CM | POA: Diagnosis not present

## 2019-05-03 DIAGNOSIS — I10 Essential (primary) hypertension: Secondary | ICD-10-CM | POA: Diagnosis not present

## 2019-05-03 DIAGNOSIS — Z23 Encounter for immunization: Secondary | ICD-10-CM | POA: Diagnosis not present

## 2019-05-03 DIAGNOSIS — G43909 Migraine, unspecified, not intractable, without status migrainosus: Secondary | ICD-10-CM | POA: Diagnosis not present

## 2019-05-03 LAB — POCT GLYCOSYLATED HEMOGLOBIN (HGB A1C): Hemoglobin A1C: 5.9 % — AB (ref 4.0–5.6)

## 2019-05-03 MED ORDER — TOPIRAMATE 50 MG PO TABS: 50.0000 mg | ORAL_TABLET | Freq: Two times a day (BID) | ORAL | 1 refills | Status: DC

## 2019-05-03 MED ORDER — JENTADUETO 2.5-1000 MG PO TABS: ORAL_TABLET | ORAL | 1 refills | Status: DC

## 2019-05-03 MED ORDER — AMLODIPINE BESYLATE 10 MG PO TABS: 10.0000 mg | ORAL_TABLET | Freq: Every day | ORAL | 1 refills | Status: DC

## 2019-05-03 MED ORDER — METOPROLOL SUCCINATE ER 25 MG PO TB24: 25.0000 mg | ORAL_TABLET | Freq: Every day | ORAL | 1 refills | Status: DC

## 2019-05-03 NOTE — Assessment & Plan Note (Addendum)
A1c looks great at 5.9 today which is stable from previous. F/U in 4 months.  We also discussed the benefits of being on a statin and the fact that it reduces potential for cardiac risk.  Last LDL was 111.

## 2019-05-03 NOTE — Progress Notes (Signed)
Established Patient Office Visit  Subjective:  Patient ID: Samantha Price, female    DOB: Jun 12, 1961  Age: 58 y.o. MRN: [id number]  CC:  Chief Complaint  Patient presents with  . Diabetes  . Hypertension    HPI Samantha Price presents for   Hypertension- Pt denies chest pain, SOB, dizziness, or heart palpitations.  Taking meds as directed w/o problems.  Denies medication side effects.  He has actually been out of her amlodipine for 2 months.  Diabetes - no hypoglycemic events. No wounds or sores that are not healing well. No increased thirst or urination. Checking glucose at home. Taking medications as prescribed without any side effects.  Says she has her eye exam scheduled for January.  She will be traveling to [place] for the holidays for couple of weeks to be with family as she turned 22 this year.  F/U migraines - doing well on topamax. Due for RF.   Past Medical History:  Diagnosis Date  . Anemia   . Cyst    drained from Rt breast  . Diabetes mellitus    type 2  . Fibromyalgia   . Herniated disc   . Migraines   . Wears glasses     Past Surgical History:  Procedure Laterality Date  . APPENDECTOMY    . breast cyst excison     Left,   . COLONOSCOPY W/ POLYPECTOMY     2007, Dr. Oletta Lamas.    . TUBAL LIGATION      Family History  Problem Relation Age of Onset  . Diabetes Mother   . Cancer Father        stomach  . Hypertrophic cardiomyopathy Brother   . Colon cancer Other        aunt, cousin  . Heart disease Other        family hisatory  . Leukemia Other        1st cousin  . Breast cancer Other        aunt  . Stroke Other        Grandparent    Social History   Socioeconomic History  . Marital status: Widowed    Spouse name: Annie Main   . Number of children: 3  . Years of education: BA  . Highest education level: Not on file  Occupational History  . 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 Financial resource strain: Not on file  . Food insecurity    Worry: Not on file    Inability: Not on file  . Transportation needs    Medical: Not on file    Non-medical: Not on file  Tobacco Use  . Smoking status: Never Smoker  . Smokeless tobacco: Never Used  Substance and Sexual Activity  . Alcohol use: Yes  . Drug use: Yes  . Sexual activity: Not on file    Comment: widow, regularly exercises  Lifestyle  . Physical activity    Days per week: Not on file    Minutes per session: Not on file  . Stress: Not on file  Relationships  . Social Herbalist on phone: Not on file    Gets together: Not on file    Attends religious service: Not on file    Active member of club or organization: Not on file    Attends meetings of clubs or organizations: Not on file  Relationship status: Not on file  . Intimate partner violence    Fear of current or ex partner: Not on file    Emotionally abused: Not on file    Physically abused: Not on file    Forced sexual activity: Not on file  Other Topics Concern  . Not on file  Social History Narrative    Has 3 daughters. All 3 out of the home now.   Her husband committed suicide in 09-20-2008 from PTSD, and mother died 11 months prior to that.  Mom died in a tragic accident.     Outpatient Medications Prior to Visit  Medication Sig Dispense Refill  . AMBULATORY NON FORMULARY MEDICATION Medication Name: Glucometer with strips and lancets to test 2-3 time per week. 1 Units PRN  . aspirin 81 MG tablet Take 81 mg by mouth daily.      . diclofenac sodium (VOLTAREN) 1 % GEL Apply 2 g topically 4 (four) times daily. To affected joint. 100 g 11  . gabapentin (NEURONTIN) 300 MG capsule One tab PO qHS for a week, then BID for a week, then TID. May double weekly to a max of 3,600mg /day 180 capsule 3  . metoprolol tartrate (LOPRESSOR) 25 MG tablet Take 1 tablet (25 mg total) by mouth 2 (two) times daily. 60 tablet 3  .  Multiple Vitamin (MULTIVITAMIN PO) Take by mouth daily.      . nitroGLYCERIN (NITROSTAT) 0.4 MG SL tablet Place 1 tablet (0.4 mg total) under the tongue every 5 (five) minutes as needed for chest pain. 50 tablet 3  . amLODipine (NORVASC) 10 MG tablet Take 1 tablet (10 mg total) by mouth daily. 90 tablet 1  . linaGLIPtin-metFORMIN HCl (JENTADUETO) 2.10-998 MG TABS TAKE 1 TABLET BY MOUTH TWICE DAILY 180 tablet 1  . lisinopril-hydrochlorothiazide (PRINZIDE,ZESTORETIC) 20-12.5 MG tablet TAKE 1 TABLET BY MOUTH TWICE DAILY 180 tablet 1  . topiramate (TOPAMAX) 50 MG tablet Take 1 tablet (50 mg total) by mouth 2 (two) times daily. 180 tablet 1   No facility-administered medications prior to visit.     Allergies  Allergen Reactions  . Latex Rash    ROS Review of Systems    Objective:    Physical Exam  Constitutional: She is oriented to person, place, and time. She appears well-developed and well-nourished.  HENT:  Head: Normocephalic and atraumatic.  Neck: Neck supple. No thyromegaly present.  Cardiovascular: Normal rate, regular rhythm and normal heart sounds.  Pulmonary/Chest: Effort normal and breath sounds normal.  Lymphadenopathy:    She has no cervical adenopathy.  Neurological: She is alert and oriented to person, place, and time.  Skin: Skin is warm and dry.  Psychiatric: She has a normal mood and affect. Her behavior is normal.    BP (!) 151/72   Pulse 83   Ht 5\' 2"  (1.575 m)   Wt 167 lb (75.8 kg)   LMP 06/28/2011   SpO2 96%   BMI 30.54 kg/m  Wt Readings from Last 3 Encounters:  05/03/19 167 lb (75.8 kg)  12/10/18 164 lb (74.4 kg)  11/30/18 169 lb (76.7 kg)     Health Maintenance Due  Topic Date Due  . PAP SMEAR-Modifier  05/17/1980  . COLONOSCOPY  10/04/2016    There are no preventive care reminders to display for this patient.  Lab Results  Component Value Date   TSH 0.95 08/09/2017   Lab Results  Component Value Date   WBC 7.2 12/10/2018   HGB 12.7  12/10/2018   HCT 38.5 12/10/2018   MCV 90.8 12/10/2018   PLT 351 12/10/2018   Lab Results  Component Value Date   NA 142 12/10/2018   K 4.0 12/10/2018   CO2 26 12/10/2018   GLUCOSE 106 (H) 12/10/2018   BUN 12 12/10/2018   CREATININE 0.97 12/10/2018   BILITOT 0.5 12/10/2018   ALKPHOS 51 09/18/2015   AST 19 12/10/2018   ALT 15 12/10/2018   PROT 7.4 12/10/2018   ALBUMIN 4.0 09/18/2015   CALCIUM 10.4 12/10/2018   Lab Results  Component Value Date   CHOL 203 (H) 12/10/2018   Lab Results  Component Value Date   HDL 64 12/10/2018   Lab Results  Component Value Date   LDLCALC 111 (H) 12/10/2018   Lab Results  Component Value Date   TRIG 164 (H) 12/10/2018   Lab Results  Component Value Date   CHOLHDL 3.2 12/10/2018   Lab Results  Component Value Date   HGBA1C 5.9 (A) 05/03/2019      Assessment & Plan:   Problem List Items Addressed This Visit      Cardiovascular and Mediastinum   Migraine    Doing well. RF x 6 mo.        Relevant Medications   topiramate (TOPAMAX) 50 MG tablet   amLODipine (NORVASC) 10 MG tablet   metoprolol succinate (TOPROL-XL) 25 MG 24 hr tablet   lisinopril-hydrochlorothiazide (ZESTORETIC) 20-12.5 MG tablet   Essential hypertension, benign - Primary    Pressure little bit elevated today but she is actually been out of her amlodipine for 2 months.  We will make sure we get refill sent to the pharmacy.  Try to check at home when she is able to and otherwise follow-up in 4 months.      Relevant Medications   amLODipine (NORVASC) 10 MG tablet   metoprolol succinate (TOPROL-XL) 25 MG 24 hr tablet   lisinopril-hydrochlorothiazide (ZESTORETIC) 20-12.5 MG tablet     Endocrine   Diabetes type 2, controlled (H[facility])    A1c looks great at 5.9 today which is stable from previous. F/U in 4 months.  We also discussed the benefits of being on a statin and the fact that it reduces potential for cardiac risk.  Last LDL was 111.      Relevant  Medications   linaGLIPtin-metFORMIN HCl (JENTADUETO) 2.10-998 MG TABS   lisinopril-hydrochlorothiazide (ZESTORETIC) 20-12.5 MG tablet   Other Relevant Orders   POCT HgB A1C (Completed)    Other Visit Diagnoses    Need for immunization against influenza       Relevant Orders   Flu Vaccine QUAD 36+ mos IM (Completed)      Meds ordered this encounter  Medications  . linaGLIPtin-metFORMIN HCl (JENTADUETO) 2.10-998 MG TABS    Sig: TAKE 1 TABLET BY MOUTH TWICE DAILY    Dispense:  180 tablet    Refill:  1  . topiramate (TOPAMAX) 50 MG tablet    Sig: Take 1 tablet (50 mg total) by mouth 2 (two) times daily.    Dispense:  180 tablet    Refill:  1  . amLODipine (NORVASC) 10 MG tablet    Sig: Take 1 tablet (10 mg total) by mouth daily.    Dispense:  90 tablet    Refill:  1  . metoprolol succinate (TOPROL-XL) 25 MG 24 hr tablet    Sig: Take 1 tablet (25 mg total) by mouth daily.    Dispense:  90 tablet  Refill:  1  . lisinopril-hydrochlorothiazide (ZESTORETIC) 20-12.5 MG tablet    Sig: TAKE 1 TABLET BY MOUTH TWICE DAILY    Dispense:  180 tablet    Refill:  1    Follow-up: Return in about 4 months (around 08/31/2019) for Diabetes follow-up.    Beatrice Lecher, MD

## 2019-05-03 NOTE — Assessment & Plan Note (Signed)
Pressure little bit elevated today but she is actually been out of her amlodipine for 2 months.  We will make sure we get refill sent to the pharmacy.  Try to check at home when she is able to and otherwise follow-up in 4 months.

## 2019-05-06 ENCOUNTER — Encounter: Payer: Self-pay | Admitting: Family Medicine

## 2019-05-06 MED ORDER — LISINOPRIL-HYDROCHLOROTHIAZIDE 20-12.5 MG PO TABS: ORAL_TABLET | ORAL | 1 refills | Status: DC

## 2019-05-06 NOTE — Assessment & Plan Note (Signed)
Doing well. RF x 6 mo.

## 2019-05-10 ENCOUNTER — Telehealth: Payer: Self-pay

## 2019-05-10 NOTE — Telephone Encounter (Signed)
With the current rise in numbers across the country I would not recommend travel.

## 2019-05-10 NOTE — Telephone Encounter (Signed)
Samantha Price has travel plans to travel to [place] during the [place] holiday. She wanted to know if she should still go considering her chronic medical issues and COVID-19 increase. Please advise.

## 2019-05-13 NOTE — Telephone Encounter (Signed)
I called the patient and gave her PCP recommendations.

## 2019-05-16 ENCOUNTER — Telehealth: Payer: Self-pay

## 2019-05-16 NOTE — Telephone Encounter (Signed)
Samantha Price states she may have been exposed last Sunday to COVID-19. Her mom has symptoms and has been tested. The results are not back. She is wanting to get tested. She is very anxious about getting COVID-19. She reports no symptoms. Patient advised as below. Gave testing site information. She will go today.   People with COVID-19 have had a wide range of symptoms reported - ranging from mild symptoms to severe illness. Symptoms may appear 2-14 days after exposure to the virus. People with these symptoms may have COVID-19: . Fever or chills . Cough . Shortness of breath or difficulty breathing . Fatigue . Muscle or body aches . Headache . New loss of taste or smell . Sore throat . Congestion or runny nose . Nausea or vomiting . Diarrhea .  When to Seek Emergency Medical Attention Look for emergency warning signs* for COVID-19. If someone is showing any of these signs, seek emergency medical care immediately . Trouble breathing . Persistent pain or pressure in the chest . New confusion . Inability to wake or stay awake . Bluish lips or face  How to self-isolate  . Use a separate room and bathroom for sick household members (if possible). Wendee Copp your hands often with soap and water for at least 20 seconds, especially after blowing your nose, coughing, or sneezing; going to the bathroom; and before eating or preparing food. . If soap and water are not readily available, use an alcohol-based hand sanitizer with at least 60% alcohol. Always wash hands with soap and water if hands are visibly dirty. . Provide your sick household member with clean disposable facemasks to wear at home, if available, to help prevent spreading COVID-19 to others. . Clean the sick room and bathroom, as needed, to avoid unnecessary contact with the sick person. Marland Kitchen Avoid sharing personal items like utensils, food, and drinks.   If you feel healthy but: . Recently had close contact with a person with  COVID-19 Steps to take. Stay Home and Monitor Your Health [facility]) . Stay home until 14 days after your last exposure. . Check your temperature twice a day and watch for symptoms of COVID-19. . If possible, stay away from people who are at higher-risk for getting very sick from COVID-19.  If you: . Have been diagnosed with COVID-19, or . Are waiting for test results, or . Have cough, fever, or shortness of breath, or other symptoms of COVID-19 Steps to take. Isolate Yourself from Others (Isolation) . Stay home until it is safe to be around others. . If you live with others, stay in a specific "sick room" or area and away from other people or animals, including pets. Use a separate bathroom, if available. . Read important information about caring for yourself or someone else who is sick, including when it's safe to end home isolation.

## 2019-05-16 NOTE — Telephone Encounter (Signed)
Agree with documentation as above.   Kaitlynn Tramontana, MD  

## 2019-05-17 ENCOUNTER — Ambulatory Visit: Payer: BC Managed Care – PPO

## 2019-08-30 ENCOUNTER — Ambulatory Visit: Payer: BC Managed Care – PPO | Admitting: Family Medicine

## 2019-09-20 ENCOUNTER — Telehealth: Payer: Self-pay | Admitting: Family Medicine

## 2019-09-20 NOTE — Telephone Encounter (Signed)
Samantha Price advised. She states she will speak with Dr Madilyn Fireman about having the ultrasound at her follow up appointment in April.

## 2019-09-20 NOTE — Telephone Encounter (Signed)
Called and LM on VM to return office call. KG LPN

## 2019-09-20 NOTE — Telephone Encounter (Signed)
Please call patient and let her know she is due for a follow-up ultrasound for the polyp that was seen in her gallbladder.  If she is okay with [place] scheduling this we can go ahead and order an ultrasound abdomen with diagnosis gallbladder polyp.

## 2019-09-30 ENCOUNTER — Ambulatory Visit (INDEPENDENT_AMBULATORY_CARE_PROVIDER_SITE_OTHER): Payer: BC Managed Care – PPO | Admitting: Family Medicine

## 2019-09-30 ENCOUNTER — Encounter: Payer: Self-pay | Admitting: Family Medicine

## 2019-09-30 ENCOUNTER — Other Ambulatory Visit: Payer: Self-pay

## 2019-09-30 VITALS — BP 124/71 | HR 83 | Ht 62.0 in | Wt 172.0 lb

## 2019-09-30 DIAGNOSIS — F430 Acute stress reaction: Secondary | ICD-10-CM

## 2019-09-30 DIAGNOSIS — I10 Essential (primary) hypertension: Secondary | ICD-10-CM | POA: Diagnosis not present

## 2019-09-30 DIAGNOSIS — G43909 Migraine, unspecified, not intractable, without status migrainosus: Secondary | ICD-10-CM

## 2019-09-30 DIAGNOSIS — E119 Type 2 diabetes mellitus without complications: Secondary | ICD-10-CM | POA: Diagnosis not present

## 2019-09-30 LAB — POCT GLYCOSYLATED HEMOGLOBIN (HGB A1C): Hemoglobin A1C: 6.6 % — AB (ref 4.0–5.6)

## 2019-09-30 NOTE — Assessment & Plan Note (Signed)
Well controlled. Continue current regimen. Follow up in  6 month.  

## 2019-09-30 NOTE — Assessment & Plan Note (Signed)
A1c is up a little bit from previous is now 6.6.  She admits that has been eating a little bit more and has not been exercising but plans to get back on track.  She has been under a lot of stress over the last couple months and feels like that has affected her diabetes.

## 2019-09-30 NOTE — Progress Notes (Signed)
Established Patient Office Visit  Subjective:  Patient ID: Samantha Price, female    DOB: 1960-07-05  Age: 59 y.o. MRN: [id number]  CC:  Chief Complaint  Patient presents with  . Diabetes    HPI Samantha Price presents for   Hypertension- Pt denies chest pain, SOB, dizziness, or heart palpitations.  Taking meds as directed w/o problems.  Denies medication side effects.    Diabetes - no hypoglycemic events. No wounds or sores that are not healing well. No increased thirst or urination. Checking glucose at home. Taking medications as prescribed without any side effects.  Has been under a lot of stress recently with some family situation going on.  In fact she is following up to [place] later this week to be with her family.  In fact, she said she is actually interested in talking with someone and may be working with a Social worker. Past Medical History:  Diagnosis Date  . Anemia   . Cyst    drained from Rt breast  . Diabetes mellitus    type 2  . Fibromyalgia   . Herniated disc   . Migraines   . Wears glasses     Past Surgical History:  Procedure Laterality Date  . APPENDECTOMY    . breast cyst excison     Left,   . COLONOSCOPY W/ POLYPECTOMY     05-Oct-2005, Dr. Oletta Lamas.    . TUBAL LIGATION      Family History  Problem Relation Age of Onset  . Diabetes Mother   . Cancer Father        stomach  . Hypertrophic cardiomyopathy Brother   . Colon cancer Other        aunt, cousin  . Heart disease Other        family hisatory  . Leukemia Other        1st cousin  . Breast cancer Other        aunt  . Stroke Other        Grandparent    Social History   Socioeconomic History  . Marital status: Widowed    Spouse name: Annie Main   . Number of children: 3  . Years of education: BA  . Highest education level: Not on file  Occupational History  . Occupation: Insurance underwriter: [employer]    Comment: [employer]  Tobacco Use  .  Smoking status: Never Smoker  . Smokeless tobacco: Never Used  Substance and Sexual Activity  . Alcohol use: Yes  . Drug use: Yes  . Sexual activity: Not on file    Comment: widow, regularly exercises  Other Topics Concern  . Not on file  Social History Narrative    Has 3 daughters. All 3 out of the home now.   Her husband committed suicide in 10/05/2008 from PTSD, and mother died 59 months prior to that.  Mom died in a tragic accident.    Social Determinants of Health   Financial Resource Strain:   . Difficulty of Paying Living Expenses:   Food Insecurity:   . Worried About Charity fundraiser in the Last Year:   . Arboriculturist in the Last Year:   Transportation Needs:   . Film/video editor (Medical):   Marland Kitchen Lack of Transportation (Non-Medical):   Physical Activity:   . Days of Exercise per Week:   . Minutes of Exercise per Session:   Stress:   . Feeling  of Stress :   Social Connections:   . Frequency of Communication with Friends and Family:   . Frequency of Social Gatherings with Friends and Family:   . Attends Religious Services:   . Active Member of Clubs or Organizations:   . Attends Archivist Meetings:   Marland Kitchen Marital Status:   Intimate Partner Violence:   . Fear of Current or Ex-Partner:   . Emotionally Abused:   Marland Kitchen Physically Abused:   . Sexually Abused:     Outpatient Medications Prior to Visit  Medication Sig Dispense Refill  . AMBULATORY NON FORMULARY MEDICATION Medication Name: Glucometer with strips and lancets to test 2-3 time per week. 1 Units PRN  . amLODipine (NORVASC) 10 MG tablet Take 1 tablet (10 mg total) by mouth daily. 90 tablet 1  . aspirin 81 MG tablet Take 81 mg by mouth daily.      . diclofenac sodium (VOLTAREN) 1 % GEL Apply 2 g topically 4 (four) times daily. To affected joint. 100 g 11  . gabapentin (NEURONTIN) 300 MG capsule One tab PO qHS for a week, then BID for a week, then TID. May double weekly to a max of 3,600mg /day 180 capsule  3  . linaGLIPtin-metFORMIN HCl (JENTADUETO) 2.10-998 MG TABS TAKE 1 TABLET BY MOUTH TWICE DAILY 180 tablet 1  . lisinopril-hydrochlorothiazide (ZESTORETIC) 20-12.5 MG tablet TAKE 1 TABLET BY MOUTH TWICE DAILY 180 tablet 1  . metoprolol succinate (TOPROL-XL) 25 MG 24 hr tablet Take 1 tablet (25 mg total) by mouth daily. 90 tablet 1  . Multiple Vitamin (MULTIVITAMIN PO) Take by mouth daily.      . nitroGLYCERIN (NITROSTAT) 0.4 MG SL tablet Place 1 tablet (0.4 mg total) under the tongue every 5 (five) minutes as needed for chest pain. 50 tablet 3  . topiramate (TOPAMAX) 50 MG tablet Take 1 tablet (50 mg total) by mouth 2 (two) times daily. 180 tablet 1  . metoprolol tartrate (LOPRESSOR) 25 MG tablet Take 1 tablet (25 mg total) by mouth 2 (two) times daily. 60 tablet 3   No facility-administered medications prior to visit.    Allergies  Allergen Reactions  . Latex Rash    ROS Review of Systems    Objective:    Physical Exam  Constitutional: She is oriented to person, place, and time. She appears well-developed and well-nourished.  HENT:  Head: Normocephalic and atraumatic.  Cardiovascular: Normal rate, regular rhythm and normal heart sounds.  Pulmonary/Chest: Effort normal and breath sounds normal.  Neurological: She is alert and oriented to person, place, and time.  Skin: Skin is warm and dry.  Psychiatric: She has a normal mood and affect. Her behavior is normal.    BP 124/71   Pulse 83   Ht 5\' 2"  (1.575 m)   Wt 172 lb (78 kg)   LMP 06/28/2011   SpO2 97%   BMI 31.46 kg/m  Wt Readings from Last 3 Encounters:  09/30/19 172 lb (78 kg)  05/03/19 167 lb (75.8 kg)  12/10/18 164 lb (74.4 kg)     There are no preventive care reminders to display for this patient.  There are no preventive care reminders to display for this patient.  Lab Results  Component Value Date   TSH 0.95 08/09/2017   Lab Results  Component Value Date   WBC 7.2 12/10/2018   HGB 12.7 12/10/2018    HCT 38.5 12/10/2018   MCV 90.8 12/10/2018   PLT 351 12/10/2018   Lab Results  Component Value Date   NA 142 12/10/2018   K 4.0 12/10/2018   CO2 26 12/10/2018   GLUCOSE 106 (H) 12/10/2018   BUN 12 12/10/2018   CREATININE 0.97 12/10/2018   BILITOT 0.5 12/10/2018   ALKPHOS 51 09/18/2015   AST 19 12/10/2018   ALT 15 12/10/2018   PROT 7.4 12/10/2018   ALBUMIN 4.0 09/18/2015   CALCIUM 10.4 12/10/2018   Lab Results  Component Value Date   CHOL 203 (H) 12/10/2018   Lab Results  Component Value Date   HDL 64 12/10/2018   Lab Results  Component Value Date   LDLCALC 111 (H) 12/10/2018   Lab Results  Component Value Date   TRIG 164 (H) 12/10/2018   Lab Results  Component Value Date   CHOLHDL 3.2 12/10/2018   Lab Results  Component Value Date   HGBA1C 6.6 (A) 09/30/2019      Assessment & Plan:   Problem List Items Addressed This Visit      Cardiovascular and Mediastinum   Migraine   Essential hypertension, benign    Well controlled. Continue current regimen. Follow up in 6 month.           Endocrine   Diabetes type 2, controlled ([place]) - Primary    A1c is up a little bit from previous is now 6.6.  She admits that has been eating a little bit more and has not been exercising but plans to get back on track.  She has been under a lot of stress over the last couple months and feels like that has affected her diabetes.      Relevant Orders   POCT glycosylated hemoglobin (Hb A1C) (Completed)    Other Visit Diagnoses    Acute reaction to stress       Relevant Orders   Ambulatory referral to [facility] placed for Francie Massing with [facility].  She will be traveling to [place] later this week but might be able to do virtual visits remotely I think this would actually be really helpful for her.  No orders of the defined types were placed in this encounter.   Follow-up: Return in about 3 months (around 12/30/2019) for Diabetes follow-up,  Hypertension.    Beatrice Lecher, MD

## 2020-01-31 ENCOUNTER — Other Ambulatory Visit: Payer: Self-pay | Admitting: Family Medicine

## 2020-02-28 ENCOUNTER — Ambulatory Visit: Payer: BC Managed Care – PPO | Admitting: Family Medicine

## 2020-03-01 ENCOUNTER — Other Ambulatory Visit: Payer: Self-pay | Admitting: Family Medicine

## 2020-03-05 ENCOUNTER — Encounter: Payer: Self-pay | Admitting: Family Medicine

## 2020-03-05 ENCOUNTER — Ambulatory Visit (INDEPENDENT_AMBULATORY_CARE_PROVIDER_SITE_OTHER): Payer: BC Managed Care – PPO | Admitting: Family Medicine

## 2020-03-05 VITALS — BP 132/76 | HR 85 | Ht 62.0 in | Wt 168.0 lb

## 2020-03-05 DIAGNOSIS — Z23 Encounter for immunization: Secondary | ICD-10-CM | POA: Diagnosis not present

## 2020-03-05 DIAGNOSIS — E119 Type 2 diabetes mellitus without complications: Secondary | ICD-10-CM | POA: Diagnosis not present

## 2020-03-05 DIAGNOSIS — Z1231 Encounter for screening mammogram for malignant neoplasm of breast: Secondary | ICD-10-CM | POA: Diagnosis not present

## 2020-03-05 DIAGNOSIS — I10 Essential (primary) hypertension: Secondary | ICD-10-CM | POA: Diagnosis not present

## 2020-03-05 DIAGNOSIS — Z1211 Encounter for screening for malignant neoplasm of colon: Secondary | ICD-10-CM

## 2020-03-05 LAB — POCT GLYCOSYLATED HEMOGLOBIN (HGB A1C): Hemoglobin A1C: 6.6 % — AB (ref 4.0–5.6)

## 2020-03-05 MED ORDER — METOPROLOL SUCCINATE ER 25 MG PO TB24: 25.0000 mg | ORAL_TABLET | Freq: Every day | ORAL | 1 refills | Status: DC

## 2020-03-05 MED ORDER — LISINOPRIL-HYDROCHLOROTHIAZIDE 20-12.5 MG PO TABS: 1.0000 | ORAL_TABLET | Freq: Two times a day (BID) | ORAL | 1 refills | Status: DC

## 2020-03-05 MED ORDER — JENTADUETO 2.5-1000 MG PO TABS: 1.0000 | ORAL_TABLET | Freq: Two times a day (BID) | ORAL | 0 refills | Status: DC

## 2020-03-05 NOTE — Assessment & Plan Note (Signed)
Well controlled. Continue current regimen. Follow up in  4 mo 
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
## 2020-03-07 LAB — COMPLETE METABOLIC PANEL WITH GFR
AG Ratio: 1.7 (calc) (ref 1.0–2.5)
ALT: 12 U/L (ref 6–29)
AST: 17 U/L (ref 10–35)
Albumin: 4.3 g/dL (ref 3.6–5.1)
Alkaline phosphatase (APISO): 58 U/L (ref 37–153)
BUN: 15 mg/dL (ref 7–25)
CO2: 25 mmol/L (ref 20–32)
Calcium: 9.2 mg/dL (ref 8.6–10.4)
Chloride: 105 mmol/L (ref 98–110)
Creat: 0.86 mg/dL (ref 0.50–0.99)
GFR, Est African American: 85 mL/min/{1.73_m2} (ref >=60)
GFR, Est Non African American: 73 mL/min/{1.73_m2} (ref >=60)
Globulin: 2.6 g/dL (calc) (ref 1.9–3.7)
Glucose, Bld: 133 mg/dL — ABNORMAL HIGH (ref 65–99)
Potassium: 4 mmol/L (ref 3.5–5.3)
Sodium: 140 mmol/L (ref 135–146)
Total Bilirubin: 0.7 mg/dL (ref 0.2–1.2)
Total Protein: 6.9 g/dL (ref 6.1–8.1)

## 2020-03-07 LAB — LIPID PANEL W/REFLEX DIRECT LDL
Cholesterol: 188 mg/dL (ref <200)
HDL: 59 mg/dL (ref >=50)
LDL Cholesterol (Calc): 106 mg/dL (calc) — ABNORMAL HIGH
Non-HDL Cholesterol (Calc): 129 mg/dL (calc) (ref <130)
Total CHOL/HDL Ratio: 3.2 (calc) (ref <5.0)
Triglycerides: 125 mg/dL (ref <150)

## 2020-03-09 ENCOUNTER — Other Ambulatory Visit: Payer: Self-pay | Admitting: Family Medicine

## 2020-03-14 ENCOUNTER — Other Ambulatory Visit: Payer: Self-pay

## 2020-03-14 ENCOUNTER — Emergency Department
Admission: EM | Admit: 2020-03-14 | Discharge: 2020-03-14 | Disposition: A | Discharge status: Home / Self Care | Payer: BC Managed Care – PPO | Source: Home / Self Care

## 2020-03-14 ENCOUNTER — Encounter: Payer: Self-pay | Admitting: Emergency Medicine

## 2020-03-14 DIAGNOSIS — B349 Viral infection, unspecified: Secondary | ICD-10-CM

## 2020-03-14 DIAGNOSIS — Z9189 Other specified personal risk factors, not elsewhere classified: Secondary | ICD-10-CM | POA: Diagnosis not present

## 2020-03-14 MED ORDER — BENZONATATE 100 MG PO CAPS: 100.0000 mg | ORAL_CAPSULE | Freq: Three times a day (TID) | ORAL | 0 refills | Status: DC | PRN

## 2020-03-14 MED ORDER — IBUPROFEN 800 MG PO TABS: 800.0000 mg | ORAL_TABLET | Freq: Three times a day (TID) | ORAL | 0 refills | Status: DC

## 2020-03-14 NOTE — ED Triage Notes (Signed)
Flu shot on 03/05/20, body aches started on 9/11 - have not resolved - also has a HA Pt is going to [place] next month No fever  HA only - relieved w/ topamax J & J vaccine

## 2020-03-14 NOTE — ED Provider Notes (Signed)
[facility]    CSN: [id number] Arrival date & time: 03/14/20  0820      History   Chief Complaint Chief Complaint  Patient presents with  . Generalized Body Aches  . Headache    HPI Samantha Price is a 59 y.o. female.   This is the initial [facility] urgent care visit for this 59 year old woman presenting with myalgia and headache.  She has received the J&J vaccine against Covid.  Patient has received both a flu shot and both of the Pneumovax vaccines.  She was in the doctor's office earlier this week.  Now she has a sore throat, malaise, myalgia, and intermittent headaches along with a dry cough.   Flu shot on 03/05/20, body aches started on 9/11 - have not resolved - also has a HA Pt is going to [place] next month No fever  HA only - relieved w/ topamax J & J vaccine     Past Medical History:  Diagnosis Date  . Anemia   . Cyst    drained from Rt breast  . Diabetes mellitus    type 2  . Fibromyalgia   . Herniated disc   . Migraines   . Wears glasses     Patient Active Problem List   Diagnosis Date Noted  . Gallbladder polyp 09/03/2018  . Liver cyst 08/29/2018  . [facility] DJD(carpometacarpal degenerative joint disease), localized primary, left 04/20/2017  . Ganglion cyst of flexor tendon sheath of finger of left hand 04/20/2017  . BPPV (benign paroxysmal positional vertigo) 09/15/2016  . Diastolic dysfunction without heart failure 06/22/2016  . Family history of hypertrophic cardiomyopathy 06/01/2016  . Lipoma 09/11/2015  . Cervical pain 05/01/2015  . Essential hypertension, benign 11/26/2010  . NEOPLASMS UNSPEC NATURE BONE SOFT TISSUE&SKIN 11/15/2010  . Diabetes type 2, controlled ([place]) 12/01/2009  . Migraine 12/01/2009  . Headache 12/01/2009    Past Surgical History:  Procedure Laterality Date  . APPENDECTOMY    . breast cyst excison     Left,   . COLONOSCOPY W/ POLYPECTOMY     2007, Dr. Oletta Lamas.    . TUBAL LIGATION      OB History     No obstetric history on file.      Home Medications    Prior to Admission medications   Medication Sig Start Date End Date Taking? Authorizing Provider  AMBULATORY NON FORMULARY MEDICATION Medication Name: Glucometer with strips and lancets to test 2-3 time per week. 02/20/13   Hali Marry, MD  amLODipine (NORVASC) 10 MG tablet Take 1 tablet by mouth once daily 03/10/20   Hali Marry, MD  aspirin 81 MG tablet Take 81 mg by mouth daily.      [provider]  benzonatate (TESSALON) 100 MG capsule Take 1-2 capsules (100-200 mg total) by mouth 3 (three) times daily as needed for cough. 03/14/20   Robyn Haber, MD  diclofenac sodium (VOLTAREN) 1 % GEL Apply 2 g topically 4 (four) times daily. To affected joint. 09/10/18   Hali Marry, MD  gabapentin (NEURONTIN) 300 MG capsule One tab PO qHS for a week, then BID for a week, then TID. May double weekly to a max of 3,600mg /day 08/16/18   Gregor Hams, MD  ibuprofen (ADVIL) 800 MG tablet Take 1 tablet (800 mg total) by mouth 3 (three) times daily. 03/14/20   Robyn Haber, MD  linaGLIPtin-metFORMIN HCl (JENTADUETO) 2.10-998 MG TABS Take 1 tablet by mouth in the morning and at  bedtime. 03/05/20   Hali Marry, MD  lisinopril-hydrochlorothiazide (ZESTORETIC) 20-12.5 MG tablet Take 1 tablet by mouth in the morning and at bedtime. 03/05/20   Hali Marry, MD  metoprolol succinate (TOPROL-XL) 25 MG 24 hr tablet Take 1 tablet (25 mg total) by mouth daily. 03/05/20   Hali Marry, MD  Multiple Vitamin (MULTIVITAMIN PO) Take by mouth daily.      [provider]  nitroGLYCERIN (NITROSTAT) 0.4 MG SL tablet Place 1 tablet (0.4 mg total) under the tongue every 5 (five) minutes as needed for chest pain. 08/16/18   Gregor Hams, MD  topiramate (TOPAMAX) 50 MG tablet Take 1 tablet (50 mg total) by mouth 2 (two) times daily. 05/03/19   Hali Marry, MD    Family History Family History   Problem Relation Age of Onset  . Diabetes Mother   . Cancer Father        stomach  . Hypertrophic cardiomyopathy Brother   . Colon cancer Other        aunt, cousin  . Heart disease Other        family hisatory  . Leukemia Other        1st cousin  . Breast cancer Other        aunt  . Stroke Other        Grandparent    Social History Social History   Tobacco Use  . Smoking status: Never Smoker  . Smokeless tobacco: Never Used  Substance Use Topics  . Alcohol use: Yes  . Drug use: Yes     Allergies   Latex   Review of Systems Review of Systems  Constitutional: Positive for appetite change. Negative for fever.  HENT: Positive for sore throat.   Respiratory: Positive for cough.   Musculoskeletal: Positive for myalgias.  Neurological: Positive for headaches.     Physical Exam Triage Vital Signs ED Triage Vitals  Enc Vitals Group     BP 03/14/20 0833 123/79     Pulse Rate 03/14/20 0833 79     Resp 03/14/20 0833 16     Temp 03/14/20 0833 98.7 F (37.1 C)     Temp Source 03/14/20 0833 Oral     SpO2 03/14/20 0833 97 %     Weight --      Height --      Head Circumference --      Peak Flow --      Pain Score 03/14/20 0835 4     Pain Loc --      Pain Edu? --      Excl. in [place]? --    No data found.  Updated Vital Signs BP 123/79 (BP Location: Right Arm)   Pulse 79   Temp 98.7 F (37.1 C) (Oral)   Resp 16   LMP 06/28/2011   SpO2 97%    Physical Exam Vitals and nursing note reviewed.  Constitutional:      Appearance: She is well-developed. She is obese.  Cardiovascular:     Rate and Rhythm: Normal rate and regular rhythm.     Heart sounds: Normal heart sounds.  Pulmonary:     Effort: Pulmonary effort is normal.     Breath sounds: Wheezing present.  Musculoskeletal:        General: Normal range of motion.     Cervical back: Normal range of motion.  Skin:    General: Skin is warm and dry.  Neurological:     Mental  Status: She is alert.   Psychiatric:        Mood and Affect: Mood normal.        Speech: Speech normal.        Behavior: Behavior normal.      UC Treatments / Results  Labs (all labs ordered are listed, but only abnormal results are displayed) Labs Reviewed  SARS-COV-2 RNA,(COVID-19) QUALITATIVE NAAT    EKG   Radiology No results found.  Procedures Procedures (including critical care time)  Medications Ordered in UC Medications - No data to display  Initial Impression / Assessment and Plan / UC Course  I have reviewed the triage vital signs and the nursing notes.  Pertinent labs & imaging results that were available during my care of the patient were reviewed by me and considered in my medical decision making (see chart for details).    Final Clinical Impressions(s) / UC Diagnoses   Final diagnoses:  At increased risk of exposure to COVID-19 virus  Viral illness   Discharge Instructions   None    ED Prescriptions    Medication Sig Dispense Auth. Provider   benzonatate (TESSALON) 100 MG capsule Take 1-2 capsules (100-200 mg total) by mouth 3 (three) times daily as needed for cough. 40 capsule Robyn Haber, MD   ibuprofen (ADVIL) 800 MG tablet Take 1 tablet (800 mg total) by mouth 3 (three) times daily. 21 tablet Robyn Haber, MD     PDMP not reviewed this encounter.   Robyn Haber, MD 03/14/20 [phone]

## 2020-03-15 LAB — SARS-COV-2 RNA,(COVID-19) QUALITATIVE NAAT: SARS CoV2 RNA: NOT DETECTED

## 2020-03-18 ENCOUNTER — Other Ambulatory Visit: Payer: Self-pay

## 2020-03-18 ENCOUNTER — Ambulatory Visit (INDEPENDENT_AMBULATORY_CARE_PROVIDER_SITE_OTHER): Payer: BC Managed Care – PPO

## 2020-03-18 DIAGNOSIS — Z1231 Encounter for screening mammogram for malignant neoplasm of breast: Secondary | ICD-10-CM | POA: Diagnosis not present

## 2020-06-24 ENCOUNTER — Other Ambulatory Visit: Payer: Self-pay | Admitting: Family Medicine

## 2020-06-24 DIAGNOSIS — E119 Type 2 diabetes mellitus without complications: Secondary | ICD-10-CM

## 2020-09-06 ENCOUNTER — Other Ambulatory Visit: Payer: Self-pay | Admitting: Family Medicine

## 2020-09-06 DIAGNOSIS — E119 Type 2 diabetes mellitus without complications: Secondary | ICD-10-CM

## 2020-10-06 ENCOUNTER — Other Ambulatory Visit: Payer: Self-pay | Admitting: Family Medicine

## 2020-10-06 DIAGNOSIS — E119 Type 2 diabetes mellitus without complications: Secondary | ICD-10-CM

## 2020-10-14 ENCOUNTER — Other Ambulatory Visit: Payer: Self-pay | Admitting: Neurology

## 2020-10-14 DIAGNOSIS — E119 Type 2 diabetes mellitus without complications: Secondary | ICD-10-CM

## 2020-10-19 ENCOUNTER — Other Ambulatory Visit: Payer: Self-pay | Admitting: Family Medicine

## 2020-10-19 DIAGNOSIS — I10 Essential (primary) hypertension: Secondary | ICD-10-CM

## 2020-10-19 NOTE — Telephone Encounter (Signed)
Patient has been scheduled for Wednesday 10/21/20. AM

## 2020-10-19 NOTE — Telephone Encounter (Signed)
Please call pt for f/u on DM/BP  And medication refills. TY

## 2020-10-21 ENCOUNTER — Ambulatory Visit: Payer: BC Managed Care – PPO | Admitting: Family Medicine

## 2020-10-21 ENCOUNTER — Encounter: Payer: Self-pay | Admitting: Family Medicine

## 2020-10-21 ENCOUNTER — Other Ambulatory Visit: Payer: Self-pay

## 2020-10-21 VITALS — BP 139/63 | HR 60 | Ht 62.0 in | Wt 171.0 lb

## 2020-10-21 DIAGNOSIS — D179 Benign lipomatous neoplasm, unspecified: Secondary | ICD-10-CM

## 2020-10-21 DIAGNOSIS — I10 Essential (primary) hypertension: Secondary | ICD-10-CM

## 2020-10-21 DIAGNOSIS — M25512 Pain in left shoulder: Secondary | ICD-10-CM

## 2020-10-21 DIAGNOSIS — E119 Type 2 diabetes mellitus without complications: Secondary | ICD-10-CM

## 2020-10-21 LAB — POCT GLYCOSYLATED HEMOGLOBIN (HGB A1C): Hemoglobin A1C: 7 % — AB (ref 4.0–5.6)

## 2020-10-21 MED ORDER — NITROGLYCERIN 0.4 MG SL SUBL: 0.4000 mg | SUBLINGUAL_TABLET | SUBLINGUAL | 3 refills | Status: AC | PRN

## 2020-10-21 MED ORDER — JENTADUETO XR 2.5-1000 MG PO TB24: 1.0000 | ORAL_TABLET | Freq: Every day | ORAL | 1 refills | Status: DC

## 2020-10-21 NOTE — Assessment & Plan Note (Signed)
Repeat blood pressure was improved but not quite at goal we will keep an eye on this it was better when she was here back in the fall.  Continue to work on healthy diet, low-salt diet and regular exercise.

## 2020-10-21 NOTE — Progress Notes (Signed)
Established Patient Office Visit  Subjective:  Patient ID: Samantha Price, female    DOB: 12/28/1960  Age: 60 y.o. MRN: [id number]  CC:  Chief Complaint  Patient presents with  . Hypertension  . Diabetes    HPI Samantha Price presents for   Diabetes - no hypoglycemic events. No wounds or sores that are not healing well. No increased thirst or urination. Checking glucose at home. Taking medications as prescribed without any side effects.  Hypertension- Pt denies chest pain, SOB, dizziness, or heart palpitations.  Taking meds as directed w/o problems.  Denies medication side effects.    She has been doing okay overall she is had a lot of ups and downs she has a new grandson who is in [place] which she is super excited about her middle daughter just had a new baby.  But she is also dealing currently with a court case involving her granddaughter out in [place] and that has actually been really stressful she is actually flown out there couple different times and spent a fair amount of time out there.  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  She had it removed by about 9 years ago and unfortunately it is coming back and just continues to get larger.  Also complains of left shoulder pain.  She has had problems with that shoulder before.  But it eventually just got better on its own after doing some home PT.  But more recently it started bothering her again she has noticed some pain with range of motion particular trying to reach behind her back or reaching up but she is able to do it that she has difficulty reaching behind her back she does feel limited in that particular range of motion.  She is not been doing any specific treatment she does not remember any specific injury or trauma it is painful to sleep on that shoulder.  Past Medical History:  Diagnosis Date  . Anemia   . Cyst    drained from Rt breast  . Diabetes mellitus     type 2  . Fibromyalgia   . Herniated disc   . Migraines   . Wears glasses     Past Surgical History:  Procedure Laterality Date  . APPENDECTOMY    . breast cyst excison     Left,   . COLONOSCOPY W/ POLYPECTOMY     2007, Dr. Oletta Lamas.    . TUBAL LIGATION      Family History  Problem Relation Age of Onset  . Diabetes Mother   . Cancer Father        stomach  . Hypertrophic cardiomyopathy Brother   . Colon cancer Other        aunt, cousin  . Heart disease Other        family hisatory  . Leukemia Other        1st cousin  . Breast cancer Other        aunt  . Stroke Other        Grandparent    Social History   Socioeconomic History  . Marital status: Widowed    Spouse name: Annie Main   . Number of children: 3  . Years of education: BA  . Highest education level: Not on file  Occupational History  . Occupation: Insurance underwriter: [employer]    Comment: [employer]  Tobacco Use  . Smoking status:  Never Smoker  . Smokeless tobacco: Never Used  Substance and Sexual Activity  . Alcohol use: Yes  . Drug use: Yes  . Sexual activity: Not on file    Comment: widow, regularly exercises  Other Topics Concern  . Not on file  Social History Narrative    Has 3 daughters. All 3 out of the home now.   Her husband committed suicide in Oct 24, 2008 from PTSD, and mother died 69 months prior to that.  Mom died in a tragic accident.    Social Determinants of Health   Financial Resource Strain: Not on file  Food Insecurity: Not on file  Transportation Needs: Not on file  Physical Activity: Not on file  Stress: Not on file  Social Connections: Not on file  Intimate Partner Violence: Not on file    Outpatient Medications Prior to Visit  Medication Sig Dispense Refill  . AMBULATORY NON FORMULARY MEDICATION Medication Name: Glucometer with strips and lancets to test 2-3 time per week. 1 Units PRN  . amLODipine (NORVASC) 10 MG tablet Take 1 tablet by mouth once  daily 90 tablet 1  . aspirin 81 MG tablet Take 81 mg by mouth daily.    Marland Kitchen lisinopril-hydrochlorothiazide (ZESTORETIC) 20-12.5 MG tablet TAKE 1 TABLET BY MOUTH IN THE MORNING AND AT BEDTIME 180 tablet 0  . metoprolol succinate (TOPROL-XL) 25 MG 24 hr tablet Take 1 tablet (25 mg total) by mouth daily. 90 tablet 1  . Multiple Vitamin (MULTIVITAMIN PO) Take by mouth daily.    . JENTADUETO 2.10-998 MG TABS TAKE 1 TABLET BY MOUTH IN THE MORNING AND AT BEDTIME . APPOINTMENT REQUIRED FOR FUTURE REFILLS 14 tablet 0  . nitroGLYCERIN (NITROSTAT) 0.4 MG SL tablet Place 1 tablet (0.4 mg total) under the tongue every 5 (five) minutes as needed for chest pain. 50 tablet 3  . diclofenac sodium (VOLTAREN) 1 % GEL Apply 2 g topically 4 (four) times daily. To affected joint. 100 g 11  . gabapentin (NEURONTIN) 300 MG capsule One tab PO qHS for a week, then BID for a week, then TID. May double weekly to a max of 3,600mg /day 180 capsule 3  . ibuprofen (ADVIL) 800 MG tablet Take 1 tablet (800 mg total) by mouth 3 (three) times daily. 21 tablet 0  . topiramate (TOPAMAX) 50 MG tablet Take 1 tablet (50 mg total) by mouth 2 (two) times daily. 180 tablet 1   No facility-administered medications prior to visit.    Allergies  Allergen Reactions  . Latex Rash    ROS Review of Systems    Objective:    Physical Exam  BP 139/63   Pulse 60   Ht 5\' 2"  (1.575 m)   Wt 171 lb (77.6 kg)   LMP 06/28/2011   SpO2 97%   BMI 31.28 kg/m  Wt Readings from Last 3 Encounters:  10/21/20 171 lb (77.6 kg)  03/05/20 168 lb (76.2 kg)  09/30/19 172 lb (78 kg)     Health Maintenance Due  Topic Date Due  . OPHTHALMOLOGY EXAM  02/25/2015  . COLONOSCOPY (Pts 45-24yrs Insurance coverage will need to be confirmed)  10/04/2016  . COVID-19 Vaccine (2 - Booster for Janssen series) 11/02/2019    There are no preventive care reminders to display for this patient.  Lab Results  Component Value Date   TSH 0.95 08/09/2017   Lab  Results  Component Value Date   WBC 7.2 12/10/2018   HGB 12.7 12/10/2018   HCT 38.5 12/10/2018  MCV 90.8 12/10/2018   PLT 351 12/10/2018   Lab Results  Component Value Date   NA 140 03/06/2020   K 4.0 03/06/2020   CO2 25 03/06/2020   GLUCOSE 133 (H) 03/06/2020   BUN 15 03/06/2020   CREATININE 0.86 03/06/2020   BILITOT 0.7 03/06/2020   ALKPHOS 51 09/18/2015   AST 17 03/06/2020   ALT 12 03/06/2020   PROT 6.9 03/06/2020   ALBUMIN 4.0 09/18/2015   CALCIUM 9.2 03/06/2020   Lab Results  Component Value Date   CHOL 188 03/06/2020   Lab Results  Component Value Date   HDL 59 03/06/2020   Lab Results  Component Value Date   LDLCALC 106 (H) 03/06/2020   Lab Results  Component Value Date   TRIG 125 03/06/2020   Lab Results  Component Value Date   CHOLHDL 3.2 03/06/2020   Lab Results  Component Value Date   HGBA1C 7.0 (A) 10/21/2020      Assessment & Plan:   Problem List Items Addressed This Visit      Cardiovascular and Mediastinum   Essential hypertension, benign    Repeat blood pressure was improved but not quite at goal we will keep an eye on this it was better when she was here back in the fall.  Continue to work on healthy diet, low-salt diet and regular exercise.      Relevant Medications   nitroGLYCERIN (NITROSTAT) 0.4 MG SL tablet   Other Relevant Orders   POCT glycosylated hemoglobin (Hb A1C) (Completed)   Basic Metabolic Panel (BMET)     Endocrine   Diabetes type 2, controlled ([place]) - Primary    Her A1c has gone up to 7.0 from previous beautiful A1c of 6.6.  She reports that she occasionally misses her evening dose of the Jentadueto so working to switch her to the extended release so she only has to take it once a day.  Also just really encouraged her to work on healthy food choices cutting back on carb intake and portion controlling as well as working on regular exercise.  We will see her back in 3 months to see if it is improving.      Relevant  Medications   linaGLIPtin-metFORMIN HCl ER (JENTADUETO XR) 2.10-998 MG TB24   Other Relevant Orders   POCT glycosylated hemoglobin (Hb A1C) (Completed)   Basic Metabolic Panel (BMET)     Other   Lipoma   Relevant Orders   Ambulatory referral to General Surgery    Other Visit Diagnoses    Acute pain of left shoulder         Left shoulder pain suspect bursitis based on exam.  Given handout with exercises to do on her own at home as well as band stretches.  If not improving over the next 3 weeks will refer for formal physical therapy.  Without any specific injury or trauma I do not think x-ray is needed at this time.  Meds ordered this encounter  Medications  . linaGLIPtin-metFORMIN HCl ER (JENTADUETO XR) 2.10-998 MG TB24    Sig: Take 1 tablet by mouth daily.    Dispense:  180 tablet    Refill:  1  . nitroGLYCERIN (NITROSTAT) 0.4 MG SL tablet    Sig: Place 1 tablet (0.4 mg total) under the tongue every 5 (five) minutes as needed for chest pain.    Dispense:  50 tablet    Refill:  3    Follow-up: Return in about 3 months (around  01/20/2021) for dm/bp.    Nani Gasser, MD

## 2020-10-21 NOTE — Assessment & Plan Note (Signed)
Her A1c has gone up to 7.0 from previous beautiful A1c of 6.6.  She reports that she occasionally misses her evening dose of the Jentadueto so working to switch her to the extended release so she only has to take it once a day.  Also just really encouraged her to work on healthy food choices cutting back on carb intake and portion controlling as well as working on regular exercise.  We will see her back in 3 months to see if it is improving.

## 2020-10-22 LAB — BASIC METABOLIC PANEL
BUN: 14 mg/dL (ref 7–25)
CO2: 30 mmol/L (ref 20–32)
Calcium: 9.7 mg/dL (ref 8.6–10.4)
Chloride: 102 mmol/L (ref 98–110)
Creat: 0.78 mg/dL (ref 0.50–0.99)
Glucose, Bld: 117 mg/dL — ABNORMAL HIGH (ref 65–99)
Potassium: 3.9 mmol/L (ref 3.5–5.3)
Sodium: 141 mmol/L (ref 135–146)

## 2020-10-22 NOTE — Progress Notes (Signed)
All labs are normal. 

## 2020-11-24 ENCOUNTER — Other Ambulatory Visit: Payer: Self-pay | Admitting: Family Medicine

## 2020-11-24 DIAGNOSIS — E119 Type 2 diabetes mellitus without complications: Secondary | ICD-10-CM

## 2020-12-01 ENCOUNTER — Telehealth: Payer: Self-pay

## 2020-12-01 DIAGNOSIS — E119 Type 2 diabetes mellitus without complications: Secondary | ICD-10-CM

## 2020-12-01 NOTE — Telephone Encounter (Signed)
PA for [facility] submitted, awaiting response.

## 2020-12-04 MED ORDER — XIGDUO XR 5-1000 MG PO TB24: 2.0000 | ORAL_TABLET | Freq: Every day | ORAL | 1 refills | Status: DC

## 2020-12-04 NOTE — Telephone Encounter (Signed)
Call patient and let her know that we received notification from [facility] that they will not cover the [place].  Will replace with Xigduo XR.  New prescription sent to pharmacy.  Meds ordered this encounter  Medications   Dapagliflozin-metFORMIN HCl ER (XIGDUO XR) 10-998 MG TB24    Sig: Take 2 tablets by mouth daily.    Dispense:  90 tablet    Refill:  1

## 2021-01-17 ENCOUNTER — Other Ambulatory Visit: Payer: Self-pay | Admitting: Family Medicine

## 2021-01-17 DIAGNOSIS — I10 Essential (primary) hypertension: Secondary | ICD-10-CM

## 2021-01-18 ENCOUNTER — Encounter: Payer: Self-pay | Admitting: Family Medicine

## 2021-01-18 ENCOUNTER — Other Ambulatory Visit: Payer: Self-pay

## 2021-01-18 ENCOUNTER — Ambulatory Visit: Payer: BC Managed Care – PPO | Admitting: Family Medicine

## 2021-01-18 VITALS — BP 136/72 | HR 60 | Ht 62.0 in | Wt 166.1 lb

## 2021-01-18 DIAGNOSIS — I10 Essential (primary) hypertension: Secondary | ICD-10-CM | POA: Diagnosis not present

## 2021-01-18 DIAGNOSIS — M533 Sacrococcygeal disorders, not elsewhere classified: Secondary | ICD-10-CM | POA: Diagnosis not present

## 2021-01-18 DIAGNOSIS — G8929 Other chronic pain: Secondary | ICD-10-CM

## 2021-01-18 DIAGNOSIS — Z23 Encounter for immunization: Secondary | ICD-10-CM

## 2021-01-18 DIAGNOSIS — E119 Type 2 diabetes mellitus without complications: Secondary | ICD-10-CM

## 2021-01-18 LAB — LIPID PANEL
Cholesterol: 202 mg/dL — ABNORMAL HIGH (ref <200)
HDL: 66 mg/dL (ref >=50)
LDL Cholesterol (Calc): 108 mg/dL (calc) — ABNORMAL HIGH
Non-HDL Cholesterol (Calc): 136 mg/dL (calc) — ABNORMAL HIGH (ref <130)
Total CHOL/HDL Ratio: 3.1 (calc) (ref <5.0)
Triglycerides: 161 mg/dL — ABNORMAL HIGH (ref <150)

## 2021-01-18 LAB — COMPLETE METABOLIC PANEL WITH GFR
AG Ratio: 1.4 (calc) (ref 1.0–2.5)
ALT: 15 U/L (ref 6–29)
AST: 18 U/L (ref 10–35)
Albumin: 4.2 g/dL (ref 3.6–5.1)
Alkaline phosphatase (APISO): 59 U/L (ref 37–153)
BUN: 12 mg/dL (ref 7–25)
CO2: 29 mmol/L (ref 20–32)
Calcium: 10.2 mg/dL (ref 8.6–10.4)
Chloride: 101 mmol/L (ref 98–110)
Creat: 0.96 mg/dL (ref 0.50–1.05)
Globulin: 3 g/dL (calc) (ref 1.9–3.7)
Glucose, Bld: 119 mg/dL — ABNORMAL HIGH (ref 65–99)
Potassium: 4.1 mmol/L (ref 3.5–5.3)
Sodium: 140 mmol/L (ref 135–146)
Total Bilirubin: 0.4 mg/dL (ref 0.2–1.2)
Total Protein: 7.2 g/dL (ref 6.1–8.1)
eGFR: 67 mL/min/{1.73_m2} (ref >=60)

## 2021-01-18 LAB — POCT GLYCOSYLATED HEMOGLOBIN (HGB A1C): Hemoglobin A1C: 6.2 % — AB (ref 4.0–5.6)

## 2021-01-18 LAB — CBC
HCT: 38 % (ref 35.0–45.0)
Hemoglobin: 12.4 g/dL (ref 11.7–15.5)
MCH: 29.7 pg (ref 27.0–33.0)
MCHC: 32.6 g/dL (ref 32.0–36.0)
MCV: 91.1 fL (ref 80.0–100.0)
MPV: 10 fL (ref 7.5–12.5)
Platelets: 307 10*3/uL (ref 140–400)
RBC: 4.17 10*6/uL (ref 3.80–5.10)
RDW: 12 % (ref 11.0–15.0)
WBC: 9.5 10*3/uL (ref 3.8–10.8)

## 2021-01-18 MED ORDER — ROSUVASTATIN CALCIUM 5 MG PO TABS: 5.0000 mg | ORAL_TABLET | Freq: Every day | ORAL | 3 refills | Status: AC

## 2021-01-18 MED ORDER — VALSARTAN-HYDROCHLOROTHIAZIDE 320-25 MG PO TABS: 1.0000 | ORAL_TABLET | Freq: Every day | ORAL | 3 refills | Status: AC

## 2021-01-18 NOTE — Assessment & Plan Note (Signed)
Uncontrolled.  Again I switched her lisinopril HCT to valsartan HCT which is a little bit more powerful.  She is also going to start [place] which should also help lower blood pressure slightly continue work on healthy diet and regular exercise as well.  Follow-up in 3 months.

## 2021-01-18 NOTE — Assessment & Plan Note (Signed)
We will be starting the Xigduo in the next week or 2 and we can see what the next A1c does.  We will start with 1 tab daily instead of 2 tabs daily.

## 2021-01-18 NOTE — Progress Notes (Signed)
Established Patient Office Visit  Subjective:  Patient ID: Samantha Price, female    DOB: 12-Sep-1960  Age: 60 y.o. MRN: [id number]  CC:  Chief Complaint  Patient presents with   3 month follow up    3 mth follow up DM, lower left back-side hip pain     HPI Samantha Price presents for   Diabetes - no hypoglycemic events. No wounds or sores that are not healing well. No increased thirst or urination. Checking glucose at home. Taking medications as prescribed without any side effects.  She did pick up the prescription for Xigduo but has not started it yet she is still finishing up her Jentadueto.  She was confused because of the medication change but it was requested because of the formulary.  Hypertension- Pt denies chest pain, SOB, dizziness, or heart palpitations.  Taking meds as directed w/o problems.  Denies medication side effects.    Pain over her low back near tailbone area.  She says its been bothering her for a couple of months now she notices it more when she is leaning over the sink washing dishes and sometimes even just when she lays down flat at night once in a while she will take Aleve but has not been doing that regularly.  She and her husband did move to [place] but they are still waiting for their house to be finished which is being built it will probably be October so they are still coming back and forth to the local area.  Past Medical History:  Diagnosis Date   Anemia    Cyst    drained from Rt breast   Diabetes mellitus    type 2   Fibromyalgia    Herniated disc    Migraines    Wears glasses     Past Surgical History:  Procedure Laterality Date   APPENDECTOMY     breast cyst excison     Left,    COLONOSCOPY W/ POLYPECTOMY     2007, Dr. Oletta Lamas.     TUBAL LIGATION      Family History  Problem Relation Age of Onset   Diabetes Mother    Cancer Father        stomach   Hypertrophic cardiomyopathy Brother    Colon cancer Other         aunt, cousin   Heart disease Other        family hisatory   Leukemia Other        1st cousin   Breast cancer Other        aunt   Stroke Other        Grandparent    Social History   Socioeconomic History   Marital status: Widowed    Spouse name: Annie Main    Number of children: 3   Years of education: BA   Highest education level: Not on file  Occupational History   Occupation: Insurance underwriter: [employer]    Comment: [employer]  Tobacco Use   Smoking status: Never   Smokeless tobacco: Never  Vaping Use   Vaping Use: Never used  Substance and Sexual Activity   Alcohol use: Yes   Drug use: Yes   Sexual activity: Not on file    Comment: widow, regularly exercises  Other Topics Concern   Not on file  Social History Narrative    Has 3 daughters. All 3 out of the home now.   Her  husband committed suicide in 2010 from PTSD, and mother died 21 months prior to that.  Mom died in a tragic accident.    Social Determinants of Health   Financial Resource Strain: Not on file  Food Insecurity: Not on file  Transportation Needs: Not on file  Physical Activity: Not on file  Stress: Not on file  Social Connections: Not on file  Intimate Partner Violence: Not on file    Outpatient Medications Prior to Visit  Medication Sig Dispense Refill   [place] Medication Name: Glucometer with strips and lancets to test 2-3 time per week. 1 Units PRN   aspirin 81 MG tablet Take 81 mg by mouth daily.     Dapagliflozin-metFORMIN HCl ER (XIGDUO XR) 10-998 MG TB24 Take 2 tablets by mouth daily. 90 tablet 1   Multiple Vitamin (MULTIVITAMIN PO) Take by mouth daily.     nitroGLYCERIN (NITROSTAT) 0.4 MG SL tablet Place 1 tablet (0.4 mg total) under the tongue every 5 (five) minutes as needed for chest pain. 50 tablet 3   amLODipine (NORVASC) 10 MG tablet Take 1 tablet by mouth once daily 90 tablet 1   lisinopril-hydrochlorothiazide  (ZESTORETIC) 20-12.5 MG tablet TAKE 1 TABLET BY MOUTH IN THE MORNING AND AT BEDTIME 180 tablet 0   metoprolol succinate (TOPROL-XL) 25 MG 24 hr tablet Take 1 tablet (25 mg total) by mouth daily. 90 tablet 1   No facility-administered medications prior to visit.    Allergies  Allergen Reactions   Latex Rash    ROS Review of Systems    Objective:    Physical Exam Constitutional:      Appearance: Normal appearance. She is well-developed.  HENT:     Head: Normocephalic and atraumatic.  Cardiovascular:     Rate and Rhythm: Normal rate and regular rhythm.     Heart sounds: Normal heart sounds.  Pulmonary:     Effort: Pulmonary effort is normal.     Breath sounds: Normal breath sounds.  Musculoskeletal:     Comments: Nontender over the lumbar spine.  She is tender directly over the left SI joint.  Nontender in the buttock area.  Hips and knees with normal range of motion.  Skin:    General: Skin is warm and dry.  Neurological:     Mental Status: She is alert and oriented to person, place, and time.  Psychiatric:        Behavior: Behavior normal.   BP 136/72   Pulse 60   Ht 5' 2"  (1.575 m)   Wt 166 lb 1.9 oz (75.4 kg)   LMP 06/28/2011   SpO2 98%   BMI 30.38 kg/m  Wt Readings from Last 3 Encounters:  01/18/21 166 lb 1.9 oz (75.4 kg)  10/21/20 171 lb (77.6 kg)  03/05/20 168 lb (76.2 kg)     Health Maintenance Due  Topic Date Due   Zoster Vaccines- Shingrix (1 of 2) Never done   OPHTHALMOLOGY EXAM  02/25/2015   COLONOSCOPY (Pts 45-71yr Insurance coverage will need to be confirmed)  10/04/2016   COVID-19 Vaccine (2 - Booster for Janssen series) 11/02/2019    There are no preventive care reminders to display for this patient.  Lab Results  Component Value Date   TSH 0.95 08/09/2017   Lab Results  Component Value Date   WBC 9.5 01/18/2021   HGB 12.4 01/18/2021   HCT 38.0 01/18/2021   MCV 91.1 01/18/2021   PLT 307 01/18/2021   Lab Results  Component Value  Date   NA 140 01/18/2021   K 4.1 01/18/2021   CO2 29 01/18/2021   GLUCOSE 119 (H) 01/18/2021   BUN 12 01/18/2021   CREATININE 0.96 01/18/2021   BILITOT 0.4 01/18/2021   ALKPHOS 51 09/18/2015   AST 18 01/18/2021   ALT 15 01/18/2021   PROT 7.2 01/18/2021   ALBUMIN 4.0 09/18/2015   CALCIUM 10.2 01/18/2021   EGFR 67 01/18/2021   Lab Results  Component Value Date   CHOL 202 (H) 01/18/2021   Lab Results  Component Value Date   HDL 66 01/18/2021   Lab Results  Component Value Date   LDLCALC 108 (H) 01/18/2021   Lab Results  Component Value Date   TRIG 161 (H) 01/18/2021   Lab Results  Component Value Date   CHOLHDL 3.1 01/18/2021   Lab Results  Component Value Date   HGBA1C 6.2 (A) 01/18/2021      Assessment & Plan:   Problem List Items Addressed This Visit       Cardiovascular and Mediastinum   Essential hypertension, benign    Uncontrolled.  Again I switched her lisinopril HCT to valsartan HCT which is a little bit more powerful.  She is also going to start [place] which should also help lower blood pressure slightly continue work on healthy diet and regular exercise as well.  Follow-up in 3 months.       Relevant Medications   valsartan-hydrochlorothiazide (DIOVAN-HCT) 320-25 MG tablet   rosuvastatin (CRESTOR) 5 MG tablet   Other Relevant Orders   CBC (Completed)   COMPLETE METABOLIC PANEL WITH GFR (Completed)   Lipid panel (Completed)     Endocrine   Diabetes type 2, controlled ([place]) - Primary    We will be starting the Xigduo in the next week or 2 and we can see what the next A1c does.  We will start with 1 tab daily instead of 2 tabs daily.       Relevant Medications   valsartan-hydrochlorothiazide (DIOVAN-HCT) 320-25 MG tablet   rosuvastatin (CRESTOR) 5 MG tablet   Other Relevant Orders   CBC (Completed)   COMPLETE METABOLIC PANEL WITH GFR (Completed)   Lipid panel (Completed)   POCT glycosylated hemoglobin (Hb A1C) (Completed)   Other Visit  Diagnoses     Chronic left SI joint pain       Need for tetanus, diphtheria, and acellular pertussis (Tdap) vaccine in patient of adolescent age or older       Relevant Orders   Tdap vaccine greater than or equal to 7yo IM (Completed)       Meds ordered this encounter  Medications   valsartan-hydrochlorothiazide (DIOVAN-HCT) 320-25 MG tablet    Sig: Take 1 tablet by mouth daily.    Dispense:  90 tablet    Refill:  3   rosuvastatin (CRESTOR) 5 MG tablet    Sig: Take 1 tablet (5 mg total) by mouth at bedtime.    Dispense:  90 tablet    Refill:  3   SI joint pain, left-given exercises to do on her own at home over the next several weeks.  If not improving could consider an injection.  Also consider that some of her pain could be radiating from the lumbar spine as well.  Offered to update her Tdap today.  It has been 10 years.  Follow-up: Return in about 3 months (around 04/20/2021) for Diabetes follow-up.    Beatrice Lecher, MD

## 2021-01-18 NOTE — Patient Instructions (Signed)
When she finished her lisinopril HCTZ start the valsartan HCT.  You just can take 1 tab daily to simplify your regimen.  It works very similar to the lisinopril HCTZ and that way you only have to take it once a day and it is a little bit more powerful to help lower your blood pressure more.  I did send over a cholesterol pill daily to start taking at bedtime.  Call if any problems or concerns.  Okay to start Xigduo, taking just 1 a day.

## 2021-02-10 ENCOUNTER — Other Ambulatory Visit: Payer: Self-pay | Admitting: General Surgery

## 2021-02-10 DIAGNOSIS — D1739 Benign lipomatous neoplasm of skin and subcutaneous tissue of other sites: Secondary | ICD-10-CM

## 2021-02-26 ENCOUNTER — Other Ambulatory Visit: Payer: Self-pay

## 2021-02-26 ENCOUNTER — Ambulatory Visit
Admission: RE | Admit: 2021-02-26 | Discharge: 2021-02-26 | Disposition: A | Discharge status: Home / Self Care | Payer: BC Managed Care – PPO | Source: Ambulatory Visit | Attending: General Surgery | Admitting: General Surgery

## 2021-02-26 DIAGNOSIS — D1739 Benign lipomatous neoplasm of skin and subcutaneous tissue of other sites: Secondary | ICD-10-CM

## 2021-04-18 ENCOUNTER — Other Ambulatory Visit: Payer: Self-pay | Admitting: Family Medicine

## 2021-04-18 DIAGNOSIS — I10 Essential (primary) hypertension: Secondary | ICD-10-CM

## 2021-05-10 ENCOUNTER — Other Ambulatory Visit: Payer: Self-pay | Admitting: Family Medicine

## 2021-05-10 DIAGNOSIS — I10 Essential (primary) hypertension: Secondary | ICD-10-CM

## 2021-05-10 DIAGNOSIS — E119 Type 2 diabetes mellitus without complications: Secondary | ICD-10-CM

## 2021-06-08 ENCOUNTER — Other Ambulatory Visit (HOSPITAL_COMMUNITY): Payer: BC Managed Care – PPO

## 2021-06-30 ENCOUNTER — Ambulatory Visit: Payer: Self-pay | Admitting: General Surgery

## 2021-07-19 NOTE — Progress Notes (Addendum)
COVID swab appointment: DOS 07/23/21  COVID Vaccine Completed: yes x2 Date COVID Vaccine completed: 09/07/19 Has received booster: COVID vaccine manufacturer: [employer]   Date of COVID positive in last 90 days: no  PCP - Beatrice Lecher, MD Cardiologist -   Chest x-ray - n/a EKG - 07/20/21 Epic/chart Stress Test - 2020 ECHO - 09/07/18 Epic Cardiac Cath - n/a Pacemaker/ICD device last checked: n/a Spinal Cord Stimulator: n/a  Sleep Study - yes, negative  CPAP -   Fasting Blood Sugar - 80-110 Checks Blood Sugar not consistently   Blood Thinner Instructions: Aspirin Instructions: ASA 81, no instructions will call PCP Last Dose:  Activity level: Can go up a flight of stairs and perform activities of daily living without stopping and without symptoms of chest pain or shortness of breath.    Anesthesia review:   Patient denies shortness of breath, fever, cough and chest pain at PAT appointment   Patient verbalized understanding of instructions that were given to them at the PAT appointment. Patient was also instructed that they will need to review over the PAT instructions again at home before surgery.

## 2021-07-19 NOTE — Patient Instructions (Addendum)
DUE TO COVID-19 ONLY ONE VISITOR IS ALLOWED TO COME WITH YOU AND STAY IN THE WAITING ROOM ONLY DURING PRE OP AND PROCEDURE.   **NO VISITORS ARE ALLOWED IN THE SHORT STAY AREA OR RECOVERY ROOM!!**  IF YOU WILL BE ADMITTED INTO THE HOSPITAL YOU ARE ALLOWED ONLY TWO SUPPORT PEOPLE DURING VISITATION HOURS ONLY (7 AM -8PM)   The support person(s) must pass our screening, gel in and out, and wear a mask at all times, including in the patients room. Patients must also wear a mask when staff or their support person are in the room. Visitors GUEST BADGE MUST BE WORN VISIBLY  One adult visitor may remain with you overnight and MUST be in the room by 8 P.M.  No visitors under the age of 43. Any visitor under the age of 98 must be accompanied by an adult.    COVID SWAB TESTING MUST BE COMPLETED ON:  07/23/21 day of surgery       Your procedure is scheduled on: 07/23/21   Report to [facility] Main Entrance    Report to admitting at 5:15 AM   Call this number if you have problems the morning of surgery [phone]   Do not eat food :After Midnight.   May have liquids until 4:30 AM day of surgery  CLEAR LIQUID DIET  Foods Allowed                                                                     Foods Excluded  Water, Black Coffee and tea (no milk or creamer)           liquids that you cannot  Plain Jell-O in any flavor  (No red)                                    see through such as: Fruit ices (not with fruit pulp)                                            milk, soups, orange juice              Iced Popsicles (No red)                                               All solid food                                   Apple juices Sports drinks like Gatorade (No red) Lightly seasoned clear broth or consume(fat free) Sugar   Oral Hygiene is also important to reduce your risk of infection.                                    Remember - BRUSH YOUR TEETH THE MORNING OF  SURGERY WITH YOUR  REGULAR TOOTHPASTE   Stop all vitamins and supplements today.    Call you primary care doctor and ask for instructions regarding Aspirin before surgery   Take these medicines the morning of surgery with A SIP OF WATER: None  DO NOT TAKE ANY ORAL DIABETIC MEDICATIONS DAY OF YOUR SURGERY  How to Manage Your Diabetes Before and After Surgery  Why is it important to control my blood sugar before and after surgery? Improving blood sugar levels before and after surgery helps healing and can limit problems. A way of improving blood sugar control is eating a healthy diet by:  Eating less sugar and carbohydrates  Increasing activity/exercise  Talking with your doctor about reaching your blood sugar goals High blood sugars (greater than 180 mg/dL) can raise your risk of infections and slow your recovery, so you will need to focus on controlling your diabetes during the weeks before surgery. Make sure that the doctor who takes care of your diabetes knows about your planned surgery including the date and location.  How do I manage my blood sugar before surgery? Check your blood sugar at least 4 times a day, starting 2 days before surgery, to make sure that the level is not too high or low. Check your blood sugar the morning of your surgery when you wake up and every 2 hours until you get to the Short Stay unit. If your blood sugar is less than 70 mg/dL, you will need to treat for low blood sugar: Do not take insulin. Treat a low blood sugar (less than 70 mg/dL) with  cup of clear juice (cranberry or apple), 4 glucose tablets, OR glucose gel. Recheck blood sugar in 15 minutes after treatment (to make sure it is greater than 70 mg/dL). If your blood sugar is not greater than 70 mg/dL on recheck, call [phone] for further instructions. Report your blood sugar to the short stay nurse when you get to Short Stay.  If you are admitted to the hospital after surgery: Your blood sugar will be checked  by the staff and you will probably be given insulin after surgery (instead of oral diabetes medicines) to make sure you have good blood sugar levels. The goal for blood sugar control after surgery is 80-180 mg/dL.   WHAT DO I DO ABOUT MY DIABETES MEDICATION?  Do not take oral diabetes medicines (pills) the morning of surgery.  THE DAY BEFORE SURGERY, so not take Xigduo       THE MORNING OF SURGERY, do not take Xigduo   Reviewed and Endorsed by [facility] Patient Education Committee, August 2015                               You may not have any metal on your body including hair pins, jewelry, and body piercing             Do not wear make-up, lotions, powders, perfumes, or deodorant  Do not wear nail polish including gel and S&S, artificial/acrylic nails, or any other type of covering on natural nails including finger and toenails. If you have artificial nails, gel coating, etc. that needs to be removed by a nail salon please have this removed prior to surgery or surgery may need to be canceled/ delayed if the surgeon/ anesthesia feels like they are unable to be safely monitored.   Do not shave  48 hours prior to surgery.  Do not bring valuables to the hospital. [place].   Bring small overnight bag day of surgery.              Please read over the following fact sheets you were given: IF YOU HAVE QUESTIONS ABOUT YOUR PRE-OP INSTRUCTIONS PLEASE CALL [facility] - Preparing for Surgery Before surgery, you can play an important role.  Because skin is not sterile, your skin needs to be as free of germs as possible.  You can reduce the number of germs on your skin by washing with CHG (chlorahexidine gluconate) soap before surgery.  CHG is an antiseptic cleaner which kills germs and bonds with the skin to continue killing germs even after washing. Please DO NOT use if you have an allergy to CHG or antibacterial  soaps.  If your skin becomes reddened/irritated stop using the CHG and inform your nurse when you arrive at Short Stay. Do not shave (including legs and underarms) for at least 48 hours prior to the first CHG shower.  You may shave your face/neck.  Please follow these instructions carefully:  1.  Shower with CHG Soap the night before surgery and the  morning of surgery.  2.  If you choose to wash your hair, wash your hair first as usual with your normal  shampoo.  3.  After you shampoo, rinse your hair and body thoroughly to remove the shampoo.                             4.  Use CHG as you would any other liquid soap.  You can apply chg directly to the skin and wash.  Gently with a scrungie or clean washcloth.  5.  Apply the CHG Soap to your body ONLY FROM THE NECK DOWN.   Do   not use on face/ open                           Wound or open sores. Avoid contact with eyes, ears mouth and   genitals (private parts).                       Wash face,  Genitals (private parts) with your normal soap.             6.  Wash thoroughly, paying special attention to the area where your    surgery  will be performed.  7.  Thoroughly rinse your body with warm water from the neck down.  8.  DO NOT shower/wash with your normal soap after using and rinsing off the CHG Soap.                9.  Pat yourself dry with a clean towel.            10.  Wear clean pajamas.            11.  Place clean sheets on your bed the night of your first shower and do not  sleep with pets. Day of Surgery : Do not apply any lotions/deodorants the morning of surgery.  Please wear clean clothes to the hospital/surgery center.  FAILURE TO FOLLOW THESE INSTRUCTIONS MAY RESULT IN THE CANCELLATION OF YOUR SURGERY  PATIENT SIGNATURE_________________________________  NURSE SIGNATURE__________________________________  ________________________________________________________________________

## 2021-07-20 ENCOUNTER — Encounter (HOSPITAL_COMMUNITY): Payer: Self-pay

## 2021-07-20 ENCOUNTER — Other Ambulatory Visit: Payer: Self-pay

## 2021-07-20 ENCOUNTER — Encounter (HOSPITAL_COMMUNITY)
Admission: RE | Admit: 2021-07-20 | Discharge: 2021-07-20 | Disposition: A | Discharge status: Home / Self Care | Payer: BC Managed Care – PPO | Source: Ambulatory Visit | Attending: General Surgery | Admitting: General Surgery

## 2021-07-20 VITALS — BP 158/92 | HR 71 | Temp 98.3°F | Resp 16 | Ht 62.0 in | Wt 155.4 lb

## 2021-07-20 DIAGNOSIS — I10 Essential (primary) hypertension: Secondary | ICD-10-CM | POA: Diagnosis not present

## 2021-07-20 DIAGNOSIS — Z01818 Encounter for other preprocedural examination: Secondary | ICD-10-CM | POA: Diagnosis present

## 2021-07-20 DIAGNOSIS — E119 Type 2 diabetes mellitus without complications: Secondary | ICD-10-CM | POA: Diagnosis not present

## 2021-07-20 HISTORY — DX: Essential (primary) hypertension: I10

## 2021-07-20 LAB — HEMOGLOBIN A1C
Hgb A1c MFr Bld: 7.3 % — ABNORMAL HIGH (ref 4.8–5.6)
Mean Plasma Glucose: 163 mg/dL

## 2021-07-20 LAB — CBC
HCT: 42.5 % (ref 36.0–46.0)
Hemoglobin: 13.6 g/dL (ref 12.0–15.0)
MCH: 29.6 pg (ref 26.0–34.0)
MCHC: 32 g/dL (ref 30.0–36.0)
MCV: 92.6 fL (ref 80.0–100.0)
Platelets: 290 10*3/uL (ref 150–400)
RBC: 4.59 MIL/uL (ref 3.87–5.11)
RDW: 11.9 % (ref 11.5–15.5)
WBC: 6 10*3/uL (ref 4.0–10.5)
nRBC: 0 % (ref 0.0–0.2)

## 2021-07-20 LAB — BASIC METABOLIC PANEL
Anion gap: 8 (ref 5–15)
BUN: 21 mg/dL (ref 8–23)
CO2: 27 mmol/L (ref 22–32)
Calcium: 9.3 mg/dL (ref 8.9–10.3)
Chloride: 103 mmol/L (ref 98–111)
Creatinine, Ser: 0.96 mg/dL (ref 0.44–1.00)
GFR, Estimated: >60 mL/min (ref >60)
Glucose, Bld: 134 mg/dL — ABNORMAL HIGH (ref 70–99)
Potassium: 3.6 mmol/L (ref 3.5–5.1)
Sodium: 138 mmol/L (ref 135–145)

## 2021-07-20 LAB — GLUCOSE, CAPILLARY: Glucose-Capillary: 159 mg/dL — ABNORMAL HIGH (ref 70–99)

## 2021-07-22 NOTE — Anesthesia Preprocedure Evaluation (Addendum)
Anesthesia Evaluation  Patient identified by MRN, date of birth, ID band Patient awake    Reviewed: Allergy & Precautions, NPO status , Patient's Chart, lab work & pertinent test results  Airway Mallampati: II  TM Distance: >3 FB Neck ROM: Full    Dental no notable dental hx.    Pulmonary neg pulmonary ROS,    Pulmonary exam normal breath sounds clear to auscultation       Cardiovascular hypertension, Pt. 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  Reproductive/Obstetrics                            Anesthesia Physical Anesthesia Plan  ASA: 2  Anesthesia Plan: General   Post-op Pain Management:    Induction: Intravenous  PONV Risk Score and Plan: 3 and Ondansetron, Dexamethasone, Midazolam and Treatment may vary due to age or medical condition  Airway Management Planned: LMA  Additional Equipment:   Intra-op Plan:   Post-operative Plan: Extubation in OR  Informed Consent: I have reviewed the patients History and Physical, chart, labs and discussed the procedure including the risks, benefits and alternatives for the proposed anesthesia with the patient or authorized representative who has indicated his/her understanding and acceptance.     Dental advisory given  Plan Discussed with: CRNA  Anesthesia Plan Comments:        Anesthesia Quick Evaluation

## 2021-07-23 ENCOUNTER — Inpatient Hospital Stay (HOSPITAL_COMMUNITY)
Admission: RE | Admit: 2021-07-23 | Discharge: 2021-07-26 | DRG: 940 | Disposition: A | Discharge status: Home / Self Care | Payer: BC Managed Care – PPO | Attending: General Surgery | Admitting: General Surgery

## 2021-07-23 ENCOUNTER — Ambulatory Visit (HOSPITAL_COMMUNITY): Payer: BC Managed Care – PPO | Admitting: Anesthesiology

## 2021-07-23 ENCOUNTER — Other Ambulatory Visit: Payer: Self-pay

## 2021-07-23 ENCOUNTER — Encounter (HOSPITAL_COMMUNITY): Payer: Self-pay | Admitting: General Surgery

## 2021-07-23 ENCOUNTER — Encounter (HOSPITAL_COMMUNITY)
Admission: RE | Disposition: A | Discharge status: Home / Self Care | Payer: Self-pay | Source: Home / Self Care | Attending: General Surgery

## 2021-07-23 DIAGNOSIS — Z7982 Long term (current) use of aspirin: Secondary | ICD-10-CM

## 2021-07-23 DIAGNOSIS — G8918 Other acute postprocedural pain: Principal | ICD-10-CM | POA: Diagnosis present

## 2021-07-23 DIAGNOSIS — Z01818 Encounter for other preprocedural examination: Secondary | ICD-10-CM

## 2021-07-23 DIAGNOSIS — D72829 Elevated white blood cell count, unspecified: Secondary | ICD-10-CM | POA: Diagnosis present

## 2021-07-23 DIAGNOSIS — Z20822 Contact with and (suspected) exposure to covid-19: Secondary | ICD-10-CM | POA: Diagnosis present

## 2021-07-23 DIAGNOSIS — E876 Hypokalemia: Secondary | ICD-10-CM | POA: Diagnosis present

## 2021-07-23 DIAGNOSIS — M797 Fibromyalgia: Secondary | ICD-10-CM | POA: Diagnosis present

## 2021-07-23 DIAGNOSIS — L7622 Postprocedural hemorrhage and hematoma of skin and subcutaneous tissue following other procedure: Secondary | ICD-10-CM | POA: Diagnosis present

## 2021-07-23 DIAGNOSIS — I10 Essential (primary) hypertension: Secondary | ICD-10-CM | POA: Diagnosis present

## 2021-07-23 DIAGNOSIS — Z806 Family history of leukemia: Secondary | ICD-10-CM

## 2021-07-23 DIAGNOSIS — Z803 Family history of malignant neoplasm of breast: Secondary | ICD-10-CM

## 2021-07-23 DIAGNOSIS — Z79899 Other long term (current) drug therapy: Secondary | ICD-10-CM

## 2021-07-23 DIAGNOSIS — Y838 Other surgical procedures as the cause of abnormal reaction of the patient, or of later complication, without mention of misadventure at the time of the procedure: Secondary | ICD-10-CM | POA: Diagnosis present

## 2021-07-23 DIAGNOSIS — R1110 Vomiting, unspecified: Secondary | ICD-10-CM | POA: Diagnosis present

## 2021-07-23 DIAGNOSIS — Z23 Encounter for immunization: Secondary | ICD-10-CM

## 2021-07-23 DIAGNOSIS — E119 Type 2 diabetes mellitus without complications: Secondary | ICD-10-CM | POA: Diagnosis present

## 2021-07-23 DIAGNOSIS — Z823 Family history of stroke: Secondary | ICD-10-CM

## 2021-07-23 DIAGNOSIS — Z8249 Family history of ischemic heart disease and other diseases of the circulatory system: Secondary | ICD-10-CM

## 2021-07-23 DIAGNOSIS — Z833 Family history of diabetes mellitus: Secondary | ICD-10-CM

## 2021-07-23 DIAGNOSIS — Z800 Family history of malignant neoplasm of digestive organs: Secondary | ICD-10-CM

## 2021-07-23 DIAGNOSIS — D179 Benign lipomatous neoplasm, unspecified: Secondary | ICD-10-CM | POA: Diagnosis present

## 2021-07-23 DIAGNOSIS — R262 Difficulty in walking, not elsewhere classified: Secondary | ICD-10-CM | POA: Diagnosis present

## 2021-07-23 DIAGNOSIS — D1723 Benign lipomatous neoplasm of skin and subcutaneous tissue of right leg: Secondary | ICD-10-CM | POA: Diagnosis present

## 2021-07-23 DIAGNOSIS — Z91040 Latex allergy status: Secondary | ICD-10-CM

## 2021-07-23 HISTORY — PX: LIPOMA EXCISION: SHX5283

## 2021-07-23 LAB — CBC
HCT: 38.8 % (ref 36.0–46.0)
Hemoglobin: 12.1 g/dL (ref 12.0–15.0)
MCH: 30 pg (ref 26.0–34.0)
MCHC: 31.2 g/dL (ref 30.0–36.0)
MCV: 96 fL (ref 80.0–100.0)
Platelets: 247 10*3/uL (ref 150–400)
RBC: 4.04 MIL/uL (ref 3.87–5.11)
RDW: 11.8 % (ref 11.5–15.5)
WBC: 7.2 10*3/uL (ref 4.0–10.5)
nRBC: 0 % (ref 0.0–0.2)

## 2021-07-23 LAB — CREATININE, SERUM
Creatinine, Ser: 0.67 mg/dL (ref 0.44–1.00)
GFR, Estimated: >60 mL/min (ref >60)

## 2021-07-23 LAB — GLUCOSE, CAPILLARY
Glucose-Capillary: 161 mg/dL — ABNORMAL HIGH (ref 70–99)
Glucose-Capillary: 159 mg/dL — ABNORMAL HIGH (ref 70–99)

## 2021-07-23 LAB — SARS CORONAVIRUS 2 BY RT PCR (HOSPITAL ORDER, PERFORMED IN ~~LOC~~ HOSPITAL LAB): SARS Coronavirus 2: NEGATIVE

## 2021-07-23 SURGERY — EXCISION LIPOMA
Anesthesia: General | Laterality: Right

## 2021-07-23 MED ORDER — ACETAMINOPHEN 500 MG PO TABS
1000.0000 mg | ORAL_TABLET | ORAL | Status: AC
  Administered 2021-07-23: 1000 mg via ORAL
  Filled 2021-07-23: qty 2

## 2021-07-23 MED ORDER — VALSARTAN-HYDROCHLOROTHIAZIDE 320-25 MG PO TABS: 1.0000 | ORAL_TABLET | Freq: Every day | ORAL | Status: DC

## 2021-07-23 MED ORDER — DIPHENHYDRAMINE HCL 12.5 MG/5ML PO ELIX
12.5000 mg | ORAL_SOLUTION | Freq: Four times a day (QID) | ORAL | Status: DC | PRN
  Administered 2021-07-25 (×3): 12.5 mg via ORAL
  Filled 2021-07-23 (×3): qty 5

## 2021-07-23 MED ORDER — ASPIRIN EC 81 MG PO TBEC
81.0000 mg | DELAYED_RELEASE_TABLET | Freq: Every day | ORAL | Status: DC
  Administered 2021-07-24 – 2021-07-26 (×3): 81 mg via ORAL
  Filled 2021-07-23 (×3): qty 1

## 2021-07-23 MED ORDER — OXYCODONE HCL 5 MG PO TABS: 5.0000 mg | ORAL_TABLET | Freq: Four times a day (QID) | ORAL | 0 refills | Status: DC | PRN

## 2021-07-23 MED ORDER — MIDAZOLAM HCL 5 MG/5ML IJ SOLN
INTRAMUSCULAR | Status: DC | PRN
  Administered 2021-07-23: 2 mg via INTRAVENOUS

## 2021-07-23 MED ORDER — ENOXAPARIN SODIUM 40 MG/0.4ML IJ SOSY
40.0000 mg | PREFILLED_SYRINGE | INTRAMUSCULAR | Status: DC
  Administered 2021-07-24 – 2021-07-26 (×3): 40 mg via SUBCUTANEOUS
  Filled 2021-07-23 (×3): qty 0.4

## 2021-07-23 MED ORDER — BUPIVACAINE-EPINEPHRINE 0.25% -1:200000 IJ SOLN
INTRAMUSCULAR | Status: DC | PRN
  Administered 2021-07-23: 30 mL

## 2021-07-23 MED ORDER — FENTANYL CITRATE PF 50 MCG/ML IJ SOSY
PREFILLED_SYRINGE | INTRAMUSCULAR | Status: AC
  Filled 2021-07-23: qty 1

## 2021-07-23 MED ORDER — AMISULPRIDE (ANTIEMETIC) 5 MG/2ML IV SOLN: 10.0000 mg | Freq: Once | INTRAVENOUS | Status: DC | PRN

## 2021-07-23 MED ORDER — METFORMIN HCL ER 500 MG PO TB24
1000.0000 mg | ORAL_TABLET | Freq: Two times a day (BID) | ORAL | Status: DC
  Administered 2021-07-24 – 2021-07-26 (×5): 1000 mg via ORAL
  Filled 2021-07-23 (×5): qty 2

## 2021-07-23 MED ORDER — FENTANYL CITRATE (PF) 100 MCG/2ML IJ SOLN
INTRAMUSCULAR | Status: AC
  Filled 2021-07-23: qty 2

## 2021-07-23 MED ORDER — ONDANSETRON HCL 4 MG/2ML IJ SOLN
INTRAMUSCULAR | Status: DC | PRN
  Administered 2021-07-23: 4 mg via INTRAVENOUS

## 2021-07-23 MED ORDER — DIPHENHYDRAMINE HCL 50 MG/ML IJ SOLN: 12.5000 mg | Freq: Four times a day (QID) | INTRAMUSCULAR | Status: DC | PRN

## 2021-07-23 MED ORDER — BUPIVACAINE-EPINEPHRINE (PF) 0.25% -1:200000 IJ SOLN
INTRAMUSCULAR | Status: AC
  Filled 2021-07-23: qty 30

## 2021-07-23 MED ORDER — OXYCODONE HCL 5 MG PO TABS
5.0000 mg | ORAL_TABLET | Freq: Once | ORAL | Status: AC | PRN
  Administered 2021-07-23: 5 mg via ORAL

## 2021-07-23 MED ORDER — CEFAZOLIN SODIUM-DEXTROSE 2-4 GM/100ML-% IV SOLN
2.0000 g | INTRAVENOUS | Status: AC
  Administered 2021-07-23: 2 g via INTRAVENOUS
  Filled 2021-07-23: qty 100

## 2021-07-23 MED ORDER — DEXAMETHASONE SODIUM PHOSPHATE 10 MG/ML IJ SOLN
INTRAMUSCULAR | Status: AC
  Filled 2021-07-23: qty 1

## 2021-07-23 MED ORDER — PROMETHAZINE HCL 25 MG/ML IJ SOLN: 6.2500 mg | INTRAMUSCULAR | Status: DC | PRN

## 2021-07-23 MED ORDER — OXYCODONE HCL 5 MG/5ML PO SOLN: 5.0000 mg | Freq: Once | ORAL | Status: AC | PRN

## 2021-07-23 MED ORDER — ADULT MULTIVITAMIN W/MINERALS CH
1.0000 | ORAL_TABLET | Freq: Every day | ORAL | Status: DC
  Administered 2021-07-24 – 2021-07-26 (×3): 1 via ORAL
  Filled 2021-07-23 (×3): qty 1

## 2021-07-23 MED ORDER — LACTATED RINGERS IV SOLN: INTRAVENOUS | Status: DC

## 2021-07-23 MED ORDER — HYDROCHLOROTHIAZIDE 25 MG PO TABS
25.0000 mg | ORAL_TABLET | Freq: Every day | ORAL | Status: DC
  Administered 2021-07-24 – 2021-07-26 (×3): 25 mg via ORAL
  Filled 2021-07-23 (×3): qty 1

## 2021-07-23 MED ORDER — PHENYLEPHRINE 40 MCG/ML (10ML) SYRINGE FOR IV PUSH (FOR BLOOD PRESSURE SUPPORT)
PREFILLED_SYRINGE | INTRAVENOUS | Status: DC | PRN
  Administered 2021-07-23: 80 ug via INTRAVENOUS

## 2021-07-23 MED ORDER — ORAL CARE MOUTH RINSE: 15.0000 mL | Freq: Once | OROMUCOSAL | Status: AC

## 2021-07-23 MED ORDER — IBUPROFEN 400 MG PO TABS
600.0000 mg | ORAL_TABLET | Freq: Four times a day (QID) | ORAL | Status: DC | PRN
  Filled 2021-07-23: qty 1

## 2021-07-23 MED ORDER — CHLORHEXIDINE GLUCONATE CLOTH 2 % EX PADS
6.0000 | MEDICATED_PAD | Freq: Once | CUTANEOUS | Status: DC
Start: 2021-07-23 — End: 2021-07-23

## 2021-07-23 MED ORDER — MIDAZOLAM HCL 2 MG/2ML IJ SOLN
INTRAMUSCULAR | Status: AC
  Filled 2021-07-23: qty 2

## 2021-07-23 MED ORDER — ACETAMINOPHEN 500 MG PO TABS
1000.0000 mg | ORAL_TABLET | Freq: Four times a day (QID) | ORAL | Status: DC
  Administered 2021-07-23 – 2021-07-26 (×12): 1000 mg via ORAL
  Filled 2021-07-23 (×12): qty 2

## 2021-07-23 MED ORDER — ONDANSETRON 4 MG PO TBDP: 4.0000 mg | ORAL_TABLET | Freq: Four times a day (QID) | ORAL | Status: DC | PRN

## 2021-07-23 MED ORDER — METOPROLOL TARTRATE 5 MG/5ML IV SOLN
5.0000 mg | Freq: Four times a day (QID) | INTRAVENOUS | Status: DC | PRN
  Administered 2021-07-23: 5 mg via INTRAVENOUS
  Filled 2021-07-23: qty 5

## 2021-07-23 MED ORDER — CHLORHEXIDINE GLUCONATE CLOTH 2 % EX PADS: 6.0000 | MEDICATED_PAD | Freq: Once | CUTANEOUS | Status: DC

## 2021-07-23 MED ORDER — 0.9 % SODIUM CHLORIDE (POUR BTL) OPTIME
TOPICAL | Status: DC | PRN
  Administered 2021-07-23: 1000 mL

## 2021-07-23 MED ORDER — ROSUVASTATIN CALCIUM 5 MG PO TABS
5.0000 mg | ORAL_TABLET | Freq: Every day | ORAL | Status: DC
  Administered 2021-07-23 – 2021-07-25 (×3): 5 mg via ORAL
  Filled 2021-07-23 (×3): qty 1

## 2021-07-23 MED ORDER — DAPAGLIFLOZIN PRO-METFORMIN ER 5-1000 MG PO TB24: 2.0000 | ORAL_TABLET | Freq: Every day | ORAL | Status: DC

## 2021-07-23 MED ORDER — GLYCOPYRROLATE 0.2 MG/ML IJ SOLN
INTRAMUSCULAR | Status: DC | PRN
  Administered 2021-07-23: .1 mg via INTRAVENOUS

## 2021-07-23 MED ORDER — CELECOXIB 200 MG PO CAPS
400.0000 mg | ORAL_CAPSULE | ORAL | Status: AC
  Administered 2021-07-23: 400 mg via ORAL
  Filled 2021-07-23: qty 2

## 2021-07-23 MED ORDER — PROPOFOL 10 MG/ML IV BOLUS
INTRAVENOUS | Status: DC | PRN
  Administered 2021-07-23: 200 mg via INTRAVENOUS

## 2021-07-23 MED ORDER — DEXAMETHASONE SODIUM PHOSPHATE 10 MG/ML IJ SOLN
INTRAMUSCULAR | Status: DC | PRN
  Administered 2021-07-23: 10 mg via INTRAVENOUS

## 2021-07-23 MED ORDER — NITROGLYCERIN 0.4 MG SL SUBL: 0.4000 mg | SUBLINGUAL_TABLET | SUBLINGUAL | Status: DC | PRN

## 2021-07-23 MED ORDER — CHLORHEXIDINE GLUCONATE 0.12 % MT SOLN
15.0000 mL | Freq: Once | OROMUCOSAL | Status: AC
  Administered 2021-07-23: 15 mL via OROMUCOSAL

## 2021-07-23 MED ORDER — IRBESARTAN 150 MG PO TABS
300.0000 mg | ORAL_TABLET | Freq: Every day | ORAL | Status: DC
  Administered 2021-07-24 – 2021-07-26 (×3): 300 mg via ORAL
  Filled 2021-07-23 (×3): qty 2

## 2021-07-23 MED ORDER — PROPOFOL 10 MG/ML IV BOLUS
INTRAVENOUS | Status: AC
  Filled 2021-07-23: qty 20

## 2021-07-23 MED ORDER — OXYCODONE HCL 5 MG PO TABS
5.0000 mg | ORAL_TABLET | ORAL | Status: DC | PRN
  Administered 2021-07-24: 5 mg via ORAL
  Administered 2021-07-25 – 2021-07-26 (×5): 10 mg via ORAL
  Filled 2021-07-23 (×5): qty 2
  Filled 2021-07-23: qty 1

## 2021-07-23 MED ORDER — FENTANYL CITRATE (PF) 100 MCG/2ML IJ SOLN
INTRAMUSCULAR | Status: DC | PRN
  Administered 2021-07-23 (×2): 50 ug via INTRAVENOUS

## 2021-07-23 MED ORDER — ONDANSETRON HCL 4 MG/2ML IJ SOLN
4.0000 mg | Freq: Four times a day (QID) | INTRAMUSCULAR | Status: DC | PRN
Start: 2021-07-23 — End: 2021-07-26

## 2021-07-23 MED ORDER — ONDANSETRON HCL 4 MG/2ML IJ SOLN
INTRAMUSCULAR | Status: AC
  Filled 2021-07-23: qty 2

## 2021-07-23 MED ORDER — IBUPROFEN 800 MG PO TABS: 800.0000 mg | ORAL_TABLET | Freq: Three times a day (TID) | ORAL | 0 refills | Status: DC | PRN

## 2021-07-23 MED ORDER — LIDOCAINE 2% (20 MG/ML) 5 ML SYRINGE
INTRAMUSCULAR | Status: DC | PRN
  Administered 2021-07-23: 60 mg via INTRAVENOUS

## 2021-07-23 MED ORDER — FENTANYL CITRATE PF 50 MCG/ML IJ SOSY
25.0000 ug | PREFILLED_SYRINGE | INTRAMUSCULAR | Status: DC | PRN
  Administered 2021-07-23 (×3): 50 ug via INTRAVENOUS

## 2021-07-23 MED ORDER — OXYCODONE HCL 5 MG PO TABS
ORAL_TABLET | ORAL | Status: AC
  Filled 2021-07-23: qty 1

## 2021-07-23 MED ORDER — DAPAGLIFLOZIN PROPANEDIOL 5 MG PO TABS
5.0000 mg | ORAL_TABLET | Freq: Two times a day (BID) | ORAL | Status: DC
  Administered 2021-07-24 – 2021-07-26 (×5): 5 mg via ORAL
  Filled 2021-07-23 (×6): qty 1

## 2021-07-23 MED ORDER — MORPHINE SULFATE (PF) 2 MG/ML IV SOLN
2.0000 mg | INTRAVENOUS | Status: DC | PRN
  Administered 2021-07-23 – 2021-07-26 (×10): 2 mg via INTRAVENOUS
  Filled 2021-07-23 (×10): qty 1

## 2021-07-23 SURGICAL SUPPLY — 35 items
APL PRP STRL LF DISP 70% ISPRP (MISCELLANEOUS)
APL SKNCLS STERI-STRIP NONHPOA (GAUZE/BANDAGES/DRESSINGS)
BAG COUNTER SPONGE SURGICOUNT (BAG) IMPLANT
BAG SPNG CNTER NS LX DISP (BAG)
BENZOIN TINCTURE PRP APPL 2/3 (GAUZE/BANDAGES/DRESSINGS) IMPLANT
BLADE CLIPPER SURG (BLADE) IMPLANT
CHLORAPREP W/TINT 26 (MISCELLANEOUS) IMPLANT
CLSR STERI-STRIP ANTIMIC 1/2X4 (GAUZE/BANDAGES/DRESSINGS) ×1 IMPLANT
COVER SURGICAL LIGHT HANDLE (MISCELLANEOUS) ×2 IMPLANT
DRAPE LAPAROTOMY T 98X78 PEDS (DRAPES) ×2 IMPLANT
DRAPE UTILITY XL STRL (DRAPES) ×2 IMPLANT
DRSG TEGADERM 4X4.75 (GAUZE/BANDAGES/DRESSINGS) IMPLANT
DRSG TELFA 4X8 ISLAND (GAUZE/BANDAGES/DRESSINGS) ×1 IMPLANT
ELECT PENCIL ROCKER SW 15FT (MISCELLANEOUS) ×1 IMPLANT
ELECT REM PT RETURN 15FT ADLT (MISCELLANEOUS) ×2 IMPLANT
GAUZE SPONGE 4X4 12PLY STRL (GAUZE/BANDAGES/DRESSINGS) IMPLANT
GLOVE SURG POLYISO LF SZ7 (GLOVE) ×2 IMPLANT
GLOVE SURG UNDER POLY LF SZ7.5 (GLOVE) ×2 IMPLANT
GOWN STRL REUS W/TWL LRG LVL3 (GOWN DISPOSABLE) ×4 IMPLANT
GOWN STRL REUS W/TWL XL LVL3 (GOWN DISPOSABLE) ×2 IMPLANT
KIT BASIN OR (CUSTOM PROCEDURE TRAY) ×2 IMPLANT
KIT TURNOVER KIT A (KITS) IMPLANT
NDL HYPO 25X1 1.5 SAFETY (NEEDLE) ×1 IMPLANT
NEEDLE HYPO 25X1 1.5 SAFETY (NEEDLE) ×2 IMPLANT
PACK BASIC VI WITH GOWN DISP (CUSTOM PROCEDURE TRAY) ×2 IMPLANT
PENCIL SMOKE EVACUATOR (MISCELLANEOUS) IMPLANT
SPONGE T-LAP 18X18 ~~LOC~~+RFID (SPONGE) ×2 IMPLANT
STRIP CLOSURE SKIN 1/2X4 (GAUZE/BANDAGES/DRESSINGS) IMPLANT
SUT MNCRL AB 4-0 PS2 18 (SUTURE) ×2 IMPLANT
SUT VIC AB 2-0 SH 27 (SUTURE) ×2
SUT VIC AB 2-0 SH 27X BRD (SUTURE) ×1 IMPLANT
SUT VIC AB 3-0 SH 27 (SUTURE) ×2
SUT VIC AB 3-0 SH 27XBRD (SUTURE) ×1 IMPLANT
SYR CONTROL 10ML LL (SYRINGE) ×2 IMPLANT
TOWEL OR 17X26 10 PK STRL BLUE (TOWEL DISPOSABLE) ×2 IMPLANT

## 2021-07-23 NOTE — Progress Notes (Signed)
Patient noted to have minimal drainage on legs and dressing upon arrival to unit. Dressing reinforced.  @1510  pt states she is bleeding again, Noted again minimal bleeding at site, Dressing reinforced with gauze , medipore and ABD.

## 2021-07-23 NOTE — Anesthesia Procedure Notes (Signed)
Procedure Name: LMA Insertion Date/Time: 07/23/2021 7:45 AM Performed by: Gerald Leitz, CRNA Pre-anesthesia Checklist: Patient identified, Patient being monitored, Timeout performed, Emergency Drugs available and Suction available Patient Re-evaluated:Patient Re-evaluated prior to induction Oxygen Delivery Method: Circle system utilized Preoxygenation: Pre-oxygenation with 100% oxygen Induction Type: IV induction Ventilation: Mask ventilation without difficulty LMA: LMA inserted LMA Size: 4.0 Tube type: Oral Number of attempts: 1 Placement Confirmation: positive ETCO2 and breath sounds checked- equal and bilateral Tube secured with: Tape Dental Injury: Teeth and Oropharynx as per pre-operative assessment

## 2021-07-23 NOTE — Anesthesia Postprocedure Evaluation (Signed)
Anesthesia Post Note  Patient: Samantha Price  Procedure(s) Performed: EXCISION LIPOMA RIGHT THIGH (Right)     Patient location during evaluation: PACU Anesthesia Type: General Level of consciousness: awake Pain management: pain level controlled Vital Signs Assessment: post-procedure vital signs reviewed and stable Respiratory status: spontaneous breathing, nonlabored ventilation, respiratory function stable and patient connected to nasal cannula oxygen Cardiovascular status: blood pressure returned to baseline and stable Postop Assessment: no apparent nausea or vomiting Anesthetic complications: no   No notable events documented.  Last Vitals:  Vitals:   07/23/21 1353 07/23/21 1456  BP: 132/74 136/73  Pulse: 64 64  Resp: 16 15  Temp: 36.4 C 36.6 C  SpO2: 94% 96%    Last Pain:  Vitals:   07/23/21 1456  TempSrc: Oral  PainSc:                  Labrittany Wechter P Tzipporah Nagorski

## 2021-07-23 NOTE — Op Note (Signed)
Preoperative diagnosis: recurrent right thigh lipoma  Postoperative diagnosis: same   Procedure: excision of 15 x 10 cm intramuscular thigh lipoma  Surgeon: Gurney Maxin, M.D.  Asst: none  Anesthesia: general  Indications for procedure: Samantha Price is a 61 y.o. year old female with symptoms of recurrent right thigh mass.  Description of procedure: The patient was brought into the operative suite. Anesthesia was administered with General LMA anesthesia. WHO checklist was applied. The patient was then placed in supine position with right hip abduction. The area was prepped and draped in the usual sterile fashion.  Next, marcaine was infused over the area. An elliptical incision was made through the skin. Cautery was used to dissect through the subcutaneous tissue. Blunt dissection and cautery was used to dissect the mass free of surrounding attachments. The mass was within the quadriceps and aductor muscles. The femoral nerve was just under the mass and safely dissected away from the mass. The mass was removed in its entirety. The wound was irrigated. The fascia was reapproximated with interrupted 2-0 vicrylThe incision was closed with 3-0 vicryl in interrupted fashion and the skin was closed with 4-0 monocryl in running subcuticular stitch. Steristrips and dressing were put in place for dressing. The patient awoke from anesthesia and brought to pacu in stable condition. All counts were correct. The patient tolerated the procedure well.   Findings: 15 x 10 cm lipoma  Specimen: right thigh mass  Implant: none   Blood loss: 15 ml  Local anesthesia:  30 ml marcaine   Complications: none  Gurney Maxin, M.D. General, Bariatric, & Minimally Invasive Surgery [facility]

## 2021-07-23 NOTE — H&P (Signed)
Samantha Price is an 61 y.o. female.   Chief Complaint: right leg mass HPI: 61 yo female with history of lipoma excision 10 years ago. She has had a recurrence over the last few years it has gotten larger. She presents for surgery today.  Past Medical History:  Diagnosis Date   Anemia    Cyst    drained from Rt breast   Diabetes mellitus    type 2   Fibromyalgia    Herniated disc    Hypertension    Migraines    Wears glasses     Past Surgical History:  Procedure Laterality Date   APPENDECTOMY     breast cyst excison     Left,    COLONOSCOPY W/ POLYPECTOMY     2007, Dr. Oletta Lamas.     TUBAL LIGATION      Family History  Problem Relation Age of Onset   Diabetes Mother    Cancer Father        stomach   Hypertrophic cardiomyopathy Brother    Colon cancer Other        aunt, cousin   Heart disease Other        family hisatory   Leukemia Other        1st cousin   Breast cancer Other        aunt   Stroke Other        Grandparent   Social History:  reports that she has never smoked. She has never used smokeless tobacco. She reports current alcohol use. She reports current drug use.  Allergies:  Allergies  Allergen Reactions   Latex Rash    Medications Prior to Admission  Medication Sig Dispense Refill   AMBULATORY NON FORMULARY MEDICATION Medication Name: Glucometer with strips and lancets to test 2-3 time per week. 1 Units PRN   aspirin 81 MG tablet Take 81 mg by mouth daily.     Multiple Vitamin (MULTIVITAMIN PO) Take 1 tablet by mouth daily.     nitroGLYCERIN (NITROSTAT) 0.4 MG SL tablet Place 1 tablet (0.4 mg total) under the tongue every 5 (five) minutes as needed for chest pain. 50 tablet 3   rosuvastatin (CRESTOR) 5 MG tablet Take 1 tablet (5 mg total) by mouth at bedtime. 90 tablet 3   valsartan-hydrochlorothiazide (DIOVAN-HCT) 320-25 MG tablet Take 1 tablet by mouth daily. 90 tablet 3   XIGDUO XR 10-998 MG TB24 Take 2 tablets by mouth once daily  (Patient taking differently: Take 1 tablet by mouth in the morning and at bedtime.) 180 tablet 0    Results for orders placed or performed during the hospital encounter of 07/23/21 (from the past 48 hour(s))  Glucose, capillary     Status: Abnormal   Collection Time: 07/23/21  5:24 AM  Result Value Ref Range   Glucose-Capillary 161 (H) 70 - 99 mg/dL    Comment: Glucose reference range applies only to samples taken after fasting for at least 8 hours.   No results found.  Review of Systems  Constitutional:  Negative for chills and fever.  HENT:  Negative for hearing loss.   Respiratory:  Negative for cough.   Cardiovascular:  Negative for chest pain and palpitations.  Gastrointestinal:  Negative for abdominal pain, nausea and vomiting.  Genitourinary:  Negative for dysuria and urgency.  Musculoskeletal:  Negative for myalgias and neck pain.  Skin:  Negative for rash.  Neurological:  Negative for dizziness and headaches.  Hematological:  Does not bruise/bleed easily.  Psychiatric/Behavioral:  Negative for suicidal ideas.    Blood pressure (!) 162/99, pulse 74, temperature 98.5 F (36.9 C), temperature source Oral, resp. rate 16, last menstrual period 06/28/2011, SpO2 98 %. Physical Exam Vitals reviewed.  Constitutional:      Appearance: She is well-developed.  HENT:     Head: Normocephalic and atraumatic.  Eyes:     Conjunctiva/sclera: Conjunctivae normal.     Pupils: Pupils are equal, round, and reactive to light.  Cardiovascular:     Rate and Rhythm: Normal rate and regular rhythm.  Pulmonary:     Effort: Pulmonary effort is normal.     Breath sounds: Normal breath sounds.  Abdominal:     General: Bowel sounds are normal. There is no distension.     Palpations: Abdomen is soft.     Tenderness: There is no abdominal tenderness.  Musculoskeletal:        General: Normal range of motion.     Cervical back: Normal range of motion and neck supple.     Comments: Right inner  thigh mass  Skin:    General: Skin is warm and dry.  Neurological:     Mental Status: She is alert and oriented to person, place, and time.  Psychiatric:        Behavior: Behavior normal.     Assessment/Plan 61 yo female with large lipoma -excision of right leg lipoma  Samantha Skinner, MD 07/23/2021, 7:24 AM

## 2021-07-23 NOTE — Transfer of Care (Signed)
Immediate Anesthesia Transfer of Care Note  Patient: Samantha Price  Procedure(s) Performed: Procedure(s): EXCISION LIPOMA RIGHT THIGH (Right)  Patient Location: PACU  Anesthesia Type:General  Level of Consciousness: Alert, Awake, Oriented  Airway & Oxygen Therapy: Patient Spontanous Breathing  Post-op Assessment: Report given to RN  Post vital signs: Reviewed and stable  Last Vitals:  Vitals:   07/23/21 0543 07/23/21 0841  BP: (!) 162/99 123/66  Pulse: 74 81  Resp: 16 16  Temp: 36.9 C   SpO2: 46% 04%    Complications: No apparent anesthesia complications

## 2021-07-24 ENCOUNTER — Encounter (HOSPITAL_COMMUNITY): Payer: Self-pay | Admitting: General Surgery

## 2021-07-24 NOTE — Progress Notes (Signed)
1 Day Post-Op   Subjective/Chief Complaint: Complained of bleeding overnight.  Had to have dressing reinforced.  Also had quite a bit of pain and is needing significant help to get to the bathroom.     Objective: Vital signs in last 24 hours: Temp:  [97.5 F (36.4 C)-98.9 F (37.2 C)] 98 F (36.7 C) (01/28 0920) Pulse Rate:  [54-70] 63 (01/28 0920) Resp:  [15-18] 16 (01/28 0920) BP: (125-157)/(56-83) 129/56 (01/28 0920) SpO2:  [94 %-98 %] 97 % (01/28 0920) Last BM Date: 07/23/21  Intake/Output from previous day: 01/27 0701 - 01/28 0700 In: 3040 [P.O.:1440; I.V.:1500; IV Piggyback:100] Out: 515 [Urine:500; Blood:15] Intake/Output this shift: Total I/O In: 120 [P.O.:120] Out: -   General appearance: alert, oriented, mild to moderate distress.  Breathing comfortably Extremities: dressing taken down.  Bled through [place], [employer] and gauze reinforcement.  No active bleeding.  Covered with dry sterile gauze.  Wrapped with ace.    Lab Results:  Recent Labs    07/23/21 1223  WBC 7.2  HGB 12.1  HCT 38.8  PLT 247   BMET Recent Labs    07/23/21 1223  CREATININE 0.67   PT/INR No results for input(s): LABPROT, INR in the last 72 hours. ABG No results for input(s): PHART, HCO3 in the last 72 hours.  Invalid input(s): PCO2, PO2  Studies/Results: No results found.  Anti-infectives: Anti-infectives (From admission, onward)    Start     Dose/Rate Route Frequency Ordered Stop   07/23/21 0600  ceFAZolin (ANCEF) IVPB 2g/100 mL premix        2 g 200 mL/hr over 30 Minutes Intravenous On call to O.R. 07/23/21 0037 07/23/21 0801       Assessment/Plan: s/p Procedure(s): EXCISION LIPOMA RIGHT THIGH (Right) Given difficult mobility and bleeding, will keep until tomorrow. Discussed strategies with medications to keep pain controlled better. Anticipate d/c in AM.    LOS: 0 days    Stark Klein 07/24/2021

## 2021-07-25 DIAGNOSIS — Z800 Family history of malignant neoplasm of digestive organs: Secondary | ICD-10-CM | POA: Diagnosis not present

## 2021-07-25 DIAGNOSIS — Z20822 Contact with and (suspected) exposure to covid-19: Secondary | ICD-10-CM | POA: Diagnosis present

## 2021-07-25 DIAGNOSIS — I10 Essential (primary) hypertension: Secondary | ICD-10-CM | POA: Diagnosis present

## 2021-07-25 DIAGNOSIS — D72829 Elevated white blood cell count, unspecified: Secondary | ICD-10-CM | POA: Diagnosis present

## 2021-07-25 DIAGNOSIS — Z833 Family history of diabetes mellitus: Secondary | ICD-10-CM | POA: Diagnosis not present

## 2021-07-25 DIAGNOSIS — E876 Hypokalemia: Secondary | ICD-10-CM | POA: Diagnosis present

## 2021-07-25 DIAGNOSIS — M797 Fibromyalgia: Secondary | ICD-10-CM | POA: Diagnosis present

## 2021-07-25 DIAGNOSIS — Z8249 Family history of ischemic heart disease and other diseases of the circulatory system: Secondary | ICD-10-CM | POA: Diagnosis not present

## 2021-07-25 DIAGNOSIS — L7622 Postprocedural hemorrhage and hematoma of skin and subcutaneous tissue following other procedure: Secondary | ICD-10-CM | POA: Diagnosis present

## 2021-07-25 DIAGNOSIS — Z823 Family history of stroke: Secondary | ICD-10-CM | POA: Diagnosis not present

## 2021-07-25 DIAGNOSIS — Y838 Other surgical procedures as the cause of abnormal reaction of the patient, or of later complication, without mention of misadventure at the time of the procedure: Secondary | ICD-10-CM | POA: Diagnosis present

## 2021-07-25 DIAGNOSIS — Z91040 Latex allergy status: Secondary | ICD-10-CM | POA: Diagnosis not present

## 2021-07-25 DIAGNOSIS — Z7982 Long term (current) use of aspirin: Secondary | ICD-10-CM | POA: Diagnosis not present

## 2021-07-25 DIAGNOSIS — E119 Type 2 diabetes mellitus without complications: Secondary | ICD-10-CM | POA: Diagnosis present

## 2021-07-25 DIAGNOSIS — R262 Difficulty in walking, not elsewhere classified: Secondary | ICD-10-CM | POA: Diagnosis present

## 2021-07-25 DIAGNOSIS — D179 Benign lipomatous neoplasm, unspecified: Secondary | ICD-10-CM | POA: Diagnosis present

## 2021-07-25 DIAGNOSIS — R1110 Vomiting, unspecified: Secondary | ICD-10-CM | POA: Diagnosis present

## 2021-07-25 DIAGNOSIS — Z79899 Other long term (current) drug therapy: Secondary | ICD-10-CM | POA: Diagnosis not present

## 2021-07-25 DIAGNOSIS — G8918 Other acute postprocedural pain: Secondary | ICD-10-CM | POA: Diagnosis present

## 2021-07-25 DIAGNOSIS — Z806 Family history of leukemia: Secondary | ICD-10-CM | POA: Diagnosis not present

## 2021-07-25 DIAGNOSIS — D1723 Benign lipomatous neoplasm of skin and subcutaneous tissue of right leg: Secondary | ICD-10-CM | POA: Diagnosis present

## 2021-07-25 DIAGNOSIS — Z803 Family history of malignant neoplasm of breast: Secondary | ICD-10-CM | POA: Diagnosis not present

## 2021-07-25 DIAGNOSIS — Z23 Encounter for immunization: Secondary | ICD-10-CM | POA: Diagnosis not present

## 2021-07-25 LAB — CBC
HCT: 35.2 % — ABNORMAL LOW (ref 36.0–46.0)
Hemoglobin: 11.3 g/dL — ABNORMAL LOW (ref 12.0–15.0)
MCH: 30 pg (ref 26.0–34.0)
MCHC: 32.1 g/dL (ref 30.0–36.0)
MCV: 93.4 fL (ref 80.0–100.0)
Platelets: 271 10*3/uL (ref 150–400)
RBC: 3.77 MIL/uL — ABNORMAL LOW (ref 3.87–5.11)
RDW: 11.9 % (ref 11.5–15.5)
WBC: 9.8 10*3/uL (ref 4.0–10.5)
nRBC: 0 % (ref 0.0–0.2)

## 2021-07-25 NOTE — Progress Notes (Signed)
2 Days Post-Op   Subjective/Chief Complaint: Still having significant thigh pain especially with ambulating. Needs assistance to get out of bed   Objective: Vital signs in last 24 hours: Temp:  [97.9 F (36.6 C)-98.3 F (36.8 C)] 97.9 F (36.6 C) (01/29 0522) Pulse Rate:  [55-63] 58 (01/29 0522) Resp:  [16-18] 16 (01/29 0522) BP: (129-167)/(56-75) 167/66 (01/29 0522) SpO2:  [93 %-97 %] 93 % (01/29 0522) Last BM Date: 07/23/21  Intake/Output from previous day: 01/28 0701 - 01/29 0700 In: 840 [P.O.:840] Out: 0  Intake/Output this shift: No intake/output data recorded.  Exam: Awake and alert Right thigh incision is clean.  There is no active bleeding and I do not feel an underlying hematoma. She is very tender on exam  Lab Results:  Recent Labs    07/23/21 1223 07/25/21 0424  WBC 7.2 9.8  HGB 12.1 11.3*  HCT 38.8 35.2*  PLT 247 271   BMET Recent Labs    07/23/21 1223  CREATININE 0.67   PT/INR No results for input(s): LABPROT, INR in the last 72 hours. ABG No results for input(s): PHART, HCO3 in the last 72 hours.  Invalid input(s): PCO2, PO2  Studies/Results: No results found.  Anti-infectives: Anti-infectives (From admission, onward)    Start     Dose/Rate Route Frequency Ordered Stop   07/23/21 0600  ceFAZolin (ANCEF) IVPB 2g/100 mL premix        2 g 200 mL/hr over 30 Minutes Intravenous On call to O.R. 07/23/21 [id number] 07/23/21 0801       Assessment/Plan: S/p excision of large right thigh lipoma  Give history of previous bleeding and her significant pain requiring IV pain meds as well as her difficulty ambulating, she needs at least I more day in the hospital. She is supposed to travel back to [place] post op and is anxious about this so I believe she needs further observation until at least tomorrow.    Coralie Keens MD 07/25/2021

## 2021-07-26 ENCOUNTER — Other Ambulatory Visit: Payer: Self-pay

## 2021-07-26 ENCOUNTER — Other Ambulatory Visit (HOSPITAL_COMMUNITY): Payer: Self-pay

## 2021-07-26 ENCOUNTER — Encounter (HOSPITAL_COMMUNITY): Payer: Self-pay | Admitting: Emergency Medicine

## 2021-07-26 ENCOUNTER — Emergency Department (HOSPITAL_COMMUNITY): Payer: BC Managed Care – PPO

## 2021-07-26 ENCOUNTER — Inpatient Hospital Stay (HOSPITAL_COMMUNITY)
Admission: EM | Admit: 2021-07-26 | Discharge: 2021-07-29 | Disposition: A | Discharge status: Home / Self Care | Payer: BC Managed Care – PPO | Source: Home / Self Care | Attending: General Surgery | Admitting: General Surgery

## 2021-07-26 DIAGNOSIS — L7632 Postprocedural hematoma of skin and subcutaneous tissue following other procedure: Secondary | ICD-10-CM | POA: Diagnosis present

## 2021-07-26 DIAGNOSIS — Z833 Family history of diabetes mellitus: Secondary | ICD-10-CM

## 2021-07-26 DIAGNOSIS — I10 Essential (primary) hypertension: Secondary | ICD-10-CM | POA: Diagnosis present

## 2021-07-26 DIAGNOSIS — Z79899 Other long term (current) drug therapy: Secondary | ICD-10-CM

## 2021-07-26 DIAGNOSIS — G43909 Migraine, unspecified, not intractable, without status migrainosus: Secondary | ICD-10-CM | POA: Diagnosis present

## 2021-07-26 DIAGNOSIS — Z8249 Family history of ischemic heart disease and other diseases of the circulatory system: Secondary | ICD-10-CM

## 2021-07-26 DIAGNOSIS — E119 Type 2 diabetes mellitus without complications: Secondary | ICD-10-CM | POA: Diagnosis present

## 2021-07-26 DIAGNOSIS — Z23 Encounter for immunization: Secondary | ICD-10-CM

## 2021-07-26 DIAGNOSIS — M797 Fibromyalgia: Secondary | ICD-10-CM | POA: Diagnosis present

## 2021-07-26 DIAGNOSIS — Z823 Family history of stroke: Secondary | ICD-10-CM

## 2021-07-26 DIAGNOSIS — Z7982 Long term (current) use of aspirin: Secondary | ICD-10-CM

## 2021-07-26 DIAGNOSIS — Y838 Other surgical procedures as the cause of abnormal reaction of the patient, or of later complication, without mention of misadventure at the time of the procedure: Secondary | ICD-10-CM | POA: Diagnosis present

## 2021-07-26 DIAGNOSIS — E876 Hypokalemia: Secondary | ICD-10-CM | POA: Diagnosis present

## 2021-07-26 DIAGNOSIS — Z806 Family history of leukemia: Secondary | ICD-10-CM

## 2021-07-26 DIAGNOSIS — Z91040 Latex allergy status: Secondary | ICD-10-CM

## 2021-07-26 DIAGNOSIS — Z800 Family history of malignant neoplasm of digestive organs: Secondary | ICD-10-CM

## 2021-07-26 DIAGNOSIS — Z20822 Contact with and (suspected) exposure to covid-19: Secondary | ICD-10-CM | POA: Diagnosis present

## 2021-07-26 DIAGNOSIS — D1723 Benign lipomatous neoplasm of skin and subcutaneous tissue of right leg: Secondary | ICD-10-CM | POA: Diagnosis present

## 2021-07-26 DIAGNOSIS — T148XXA Other injury of unspecified body region, initial encounter: Secondary | ICD-10-CM | POA: Diagnosis present

## 2021-07-26 DIAGNOSIS — Z803 Family history of malignant neoplasm of breast: Secondary | ICD-10-CM

## 2021-07-26 LAB — CBC WITH DIFFERENTIAL/PLATELET
Abs Immature Granulocytes: 0.06 10*3/uL (ref 0.00–0.07)
Basophils Absolute: 0.1 10*3/uL (ref 0.0–0.1)
Basophils Relative: 1 %
Eosinophils Absolute: 0.2 10*3/uL (ref 0.0–0.5)
Eosinophils Relative: 1 %
HCT: 29.2 % — ABNORMAL LOW (ref 36.0–46.0)
Hemoglobin: 9.4 g/dL — ABNORMAL LOW (ref 12.0–15.0)
Immature Granulocytes: 1 %
Lymphocytes Relative: 22 %
Lymphs Abs: 2.4 10*3/uL (ref 0.7–4.0)
MCH: 30.2 pg (ref 26.0–34.0)
MCHC: 32.2 g/dL (ref 30.0–36.0)
MCV: 93.9 fL (ref 80.0–100.0)
Monocytes Absolute: 0.6 10*3/uL (ref 0.1–1.0)
Monocytes Relative: 6 %
Neutro Abs: 7.7 10*3/uL (ref 1.7–7.7)
Neutrophils Relative %: 69 %
Platelets: 245 10*3/uL (ref 150–400)
RBC: 3.11 MIL/uL — ABNORMAL LOW (ref 3.87–5.11)
RDW: 11.9 % (ref 11.5–15.5)
WBC: 10.9 10*3/uL — ABNORMAL HIGH (ref 4.0–10.5)
nRBC: 0 % (ref 0.0–0.2)

## 2021-07-26 LAB — COMPREHENSIVE METABOLIC PANEL
ALT: 53 U/L — ABNORMAL HIGH (ref 0–44)
AST: 99 U/L — ABNORMAL HIGH (ref 15–41)
Albumin: 3.1 g/dL — ABNORMAL LOW (ref 3.5–5.0)
Alkaline Phosphatase: 73 U/L (ref 38–126)
Anion gap: 6 (ref 5–15)
BUN: 20 mg/dL (ref 8–23)
CO2: 28 mmol/L (ref 22–32)
Calcium: 8.2 mg/dL — ABNORMAL LOW (ref 8.9–10.3)
Chloride: 102 mmol/L (ref 98–111)
Creatinine, Ser: 0.79 mg/dL (ref 0.44–1.00)
GFR, Estimated: >60 mL/min (ref >60)
Glucose, Bld: 202 mg/dL — ABNORMAL HIGH (ref 70–99)
Potassium: 3.4 mmol/L — ABNORMAL LOW (ref 3.5–5.1)
Sodium: 136 mmol/L (ref 135–145)
Total Bilirubin: 0.6 mg/dL (ref 0.3–1.2)
Total Protein: 5.8 g/dL — ABNORMAL LOW (ref 6.5–8.1)

## 2021-07-26 MED ORDER — MORPHINE SULFATE (PF) 4 MG/ML IV SOLN
4.0000 mg | Freq: Once | INTRAVENOUS | Status: AC
Start: 2021-07-26 — End: 2021-07-26
  Administered 2021-07-26: 4 mg via INTRAVENOUS
  Filled 2021-07-26: qty 1

## 2021-07-26 MED ORDER — ONDANSETRON HCL 4 MG/2ML IJ SOLN
4.0000 mg | Freq: Once | INTRAMUSCULAR | Status: AC
  Administered 2021-07-26: 4 mg via INTRAVENOUS
  Filled 2021-07-26: qty 2

## 2021-07-26 MED ORDER — OXYCODONE HCL 5 MG PO TABS
5.0000 mg | ORAL_TABLET | Freq: Four times a day (QID) | ORAL | 0 refills | Status: AC | PRN
  Filled 2021-07-26: qty 20, 5d supply, fill #0

## 2021-07-26 MED ORDER — IOHEXOL 350 MG/ML SOLN
100.0000 mL | Freq: Once | INTRAVENOUS | Status: AC | PRN
  Administered 2021-07-26: 100 mL via INTRAVENOUS

## 2021-07-26 MED ORDER — MORPHINE SULFATE (PF) 2 MG/ML IV SOLN
2.0000 mg | Freq: Once | INTRAVENOUS | Status: AC
  Administered 2021-07-26: 2 mg via INTRAVENOUS
  Filled 2021-07-26: qty 1

## 2021-07-26 MED ORDER — IBUPROFEN 800 MG PO TABS
800.0000 mg | ORAL_TABLET | Freq: Three times a day (TID) | ORAL | 0 refills | Status: AC | PRN
  Filled 2021-07-26 (×2): qty 30, 10d supply, fill #0

## 2021-07-26 NOTE — ED Provider Notes (Signed)
[facility] DEPT Provider Note  CSN: [id number] Arrival date & time: 07/26/21 2104  Chief Complaint(s) bleeding from surgical site   HPI Samantha Price is a 61 y.o. female with PMH T2DM, migraines, postop day 3 from right thigh lipoma removal discharged today who presents the emergency department for evaluation of right thigh pain.  Patient states that after discharge from the hospital today she used the bathroom and noticed significant swelling around the surgical site with worsening pain.  She arrives with small amount of bleeding from the incision site but denies numbness, tingling, weakness of the lower extremity.  Denies additional traumatic or systemic complaints.  HPI  Past Medical History Past Medical History:  Diagnosis Date   Anemia    Cyst    drained from Rt breast   Diabetes mellitus    type 2   Fibromyalgia    Herniated disc    Hypertension    Migraines    Wears glasses    Patient Active Problem List   Diagnosis Date Noted   Hematoma 07/26/2021   Lipoma of right thigh 07/23/2021   Gallbladder polyp 09/03/2018   Liver cyst 08/29/2018   [facility](carpometacarpal degenerative joint disease), localized primary, left 04/20/2017   Ganglion cyst of flexor tendon sheath of finger of left hand 04/20/2017   BPPV (benign paroxysmal positional vertigo) 40/98/1191   Diastolic dysfunction without heart failure 06/22/2016   Family history of hypertrophic cardiomyopathy 06/01/2016   Lipoma 09/11/2015   Cervical pain 05/01/2015   Essential hypertension, benign 11/26/2010   NEOPLASMS UNSPEC NATURE BONE SOFT TISSUE&SKIN 11/15/2010   Diabetes type 2, controlled ([place]) 12/01/2009   Migraine 12/01/2009   Headache 12/01/2009   Home Medication(s) Prior to Admission medications   Medication Sig Start Date End Date Taking? Authorizing Provider  AMBULATORY NON FORMULARY MEDICATION Medication Name: Glucometer with strips and lancets to test 2-3 time  per week. 02/20/13   Hali Marry, MD  aspirin 81 MG tablet Take 81 mg by mouth daily.    [provider]  ibuprofen (ADVIL) 800 MG tablet Take 1 tablet (800 mg total) by mouth every 8 (eight) hours as needed. 07/26/21   Kinsinger, Arta Bruce, MD  Multiple Vitamin (MULTIVITAMIN PO) Take 1 tablet by mouth daily.    [provider]  nitroGLYCERIN (NITROSTAT) 0.4 MG SL tablet Place 1 tablet (0.4 mg total) under the tongue every 5 (five) minutes as needed for chest pain. 10/21/20   Hali Marry, MD  oxyCODONE (OXY IR/ROXICODONE) 5 MG immediate release tablet Take 1 tablet (5 mg total) by mouth every 6 (six) hours as needed for severe pain. 07/26/21   Kinsinger, Arta Bruce, MD  rosuvastatin (CRESTOR) 5 MG tablet Take 1 tablet (5 mg total) by mouth at bedtime. 01/18/21   Hali Marry, MD  valsartan-hydrochlorothiazide (DIOVAN-HCT) 320-25 MG tablet Take 1 tablet by mouth daily. 01/18/21   Hali Marry, MD  XIGDUO XR 10-998 MG TB24 Take 2 tablets by mouth once daily Patient taking differently: Take 1 tablet by mouth in the morning and at bedtime. 05/10/21   Hali Marry, MD  Past Surgical History Past Surgical History:  Procedure Laterality Date   APPENDECTOMY     breast cyst excison     Left,    COLONOSCOPY W/ POLYPECTOMY     2007, Dr. Oletta Lamas.     LIPOMA EXCISION Right 07/23/2021   Procedure: EXCISION LIPOMA RIGHT THIGH;  Surgeon: Kinsinger, Arta Bruce, MD;  Location: [facility];  Service: General;  Laterality: Right;   TUBAL LIGATION     Family History Family History  Problem Relation Age of Onset   Diabetes Mother    Cancer Father        stomach   Hypertrophic cardiomyopathy Brother    Colon cancer Other        aunt, cousin   Heart disease Other        family hisatory   Leukemia Other        1st cousin    Breast cancer Other        aunt   Stroke Other        Grandparent    Social History Social History   Tobacco Use   Smoking status: Never   Smokeless tobacco: Never  Vaping Use   Vaping Use: Never used  Substance Use Topics   Alcohol use: Yes    Comment: rare   Drug use: Yes   Allergies Latex  Review of Systems Review of Systems  Musculoskeletal:        R thigh pain and swelling   Physical Exam Vital Signs  I have reviewed the triage vital signs BP (!) 139/50 (BP Location: Left Arm)    Pulse 67    Temp 98.2 F (36.8 C) (Oral)    Resp 18    LMP 06/28/2011    SpO2 99%   Physical Exam Vitals and nursing note reviewed.  Constitutional:      General: She is not in acute distress.    Appearance: She is well-developed.  HENT:     Head: Normocephalic and atraumatic.  Eyes:     Conjunctiva/sclera: Conjunctivae normal.  Cardiovascular:     Rate and Rhythm: Normal rate and regular rhythm.     Heart sounds: No murmur heard. Pulmonary:     Effort: Pulmonary effort is normal. No respiratory distress.     Breath sounds: Normal breath sounds.  Abdominal:     Palpations: Abdomen is soft.     Tenderness: There is no abdominal tenderness.  Musculoskeletal:        General: Swelling (R thigh incision with scatn bleeding, (+) swelling) and tenderness present.     Cervical back: Neck supple.  Skin:    General: Skin is warm and dry.     Capillary Refill: Capillary refill takes less than 2 seconds.  Neurological:     Mental Status: She is alert.  Psychiatric:        Mood and Affect: Mood normal.    ED Results and Treatments Labs (all labs ordered are listed, but only abnormal results are displayed) Labs Reviewed  COMPREHENSIVE METABOLIC PANEL - Abnormal; Notable for the following components:      Result Value   Potassium 3.4 (*)    Glucose, Bld 202 (*)    Calcium 8.2 (*)    Total Protein 5.8 (*)    Albumin 3.1 (*)    AST 99 (*)    ALT 53 (*)    All other components  within normal limits  CBC WITH DIFFERENTIAL/PLATELET - Abnormal; Notable for the following components:   WBC  10.9 (*)    RBC 3.11 (*)    Hemoglobin 9.4 (*)    HCT 29.2 (*)    All other components within normal limits  CBC                                                                                                                          Radiology CT ANGIO LOW EXTREM LEFT W &/OR WO CONTRAST  Result Date: 07/26/2021 CLINICAL DATA:  History of recent right thigh lipoma excision with pain and swelling in the operative site, evaluate for active hemorrhage/hematoma EXAM: CT ANGIOGRAPHY OF THE BILATERAL LOWER EXTREMITIES TECHNIQUE: Multidetector CT imaging of the bilateral lower extremitieswas performed using the standard protocol during bolus administration of intravenous contrast. Multiplanar CT image reconstructions and MIPs were obtained to evaluate the vascular anatomy. RADIATION DOSE REDUCTION: This exam was performed according to the departmental dose-optimization program which includes automated exposure control, adjustment of the mA and/or kV according to patient size and/or use of iterative reconstruction technique. CONTRAST:  152mL OMNIPAQUE IOHEXOL 350 MG/ML SOLN COMPARISON:  MRI from 02/26/2021 FINDINGS: Vascular: Distal abdominal aorta is within normal limits. The iliac arteries bilaterally are within normal limits. Left lower extremity: No significant atherosclerotic changes are noted. The posterior tibial artery on the left is diminutive with collateral flow from the peroneal identified. The anterior tibial continues into the foot. Right lower extremity one superficial femoral artery and popliteal artery are well visualized and within normal limits. Popliteal trifurcation is patent although the posterior tibial artery occludes shortly after its origin. Collateral flow from the peroneal to the distal posterior tibial artery is noted. The anterior tibial artery continues into the foot. There  are no findings to suggest acute hemorrhage in the operative bed. No areas of active extravasation are seen. No pooling of contrast is noted. Vascular: In the operative bed in the medial and anterior right thigh there is a focal hematoma identified which measures 5.2 x 3.0 cm in greatest transverse and AP dimensions respectively. It continues for approximately 12 cm in craniocaudad projection along the course of the operative bed. Some hematoma interdigitates between the muscles in the site of previous lipoma. Again no active extravasation or pooling of contrast is noted. Mild subcutaneous edema is noted. The musculature appears otherwise within normal limits. Bony structures are unremarkable. Visualized pelvic structures are within normal limits. Review of the MIP images confirms the above findings. IMPRESSION: Changes consistent with the recent right thigh lipoma resection. There is a large hematoma identified as described which lies in the anteromedial aspect of the right thigh and interdigitates somewhat between the musculature in the site of previous lipoma. The interdigitation is difficult to assess due to relative isodensity with the adjacent musculature. No active extravasation or pooling of contrast is noted. Mild lower extremity swelling is noted on the right. 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  Anterior tibial artery continues into the foot bilaterally. No significant arterial disease is noted. Electronically Signed   By: Inez Catalina M.D.   On: 07/26/2021 23:18   CT ANGIO LOW EXTREM RIGHT W &/OR WO CONTRAST  Result Date: 07/26/2021 CLINICAL DATA:  History of recent right thigh lipoma excision with pain and swelling in the operative site, evaluate for active hemorrhage/hematoma EXAM: CT ANGIOGRAPHY OF THE BILATERAL LOWER EXTREMITIES TECHNIQUE: Multidetector CT  imaging of the bilateral lower extremitieswas performed using the standard protocol during bolus administration of intravenous contrast. Multiplanar CT image reconstructions and MIPs were obtained to evaluate the vascular anatomy. RADIATION DOSE REDUCTION: This exam was performed according to the departmental dose-optimization program which includes automated exposure control, adjustment of the mA and/or kV according to patient size and/or use of iterative reconstruction technique. CONTRAST:  169mL OMNIPAQUE IOHEXOL 350 MG/ML SOLN COMPARISON:  MRI from 02/26/2021 FINDINGS: Vascular: Distal abdominal aorta is within normal limits. The iliac arteries bilaterally are within normal limits. Left lower extremity: No significant atherosclerotic changes are noted. The posterior tibial artery on the left is diminutive with collateral flow from the peroneal identified. The anterior tibial continues into the foot. Right lower extremity one superficial femoral artery and popliteal artery are well visualized and within normal limits. Popliteal trifurcation is patent although the posterior tibial artery occludes shortly after its origin. Collateral flow from the peroneal to the distal posterior tibial artery is noted. The anterior tibial artery continues into the foot. There are no findings to suggest acute hemorrhage in the operative bed. No areas of active extravasation are seen. No pooling of contrast is noted. Vascular: In the operative bed in the medial and anterior right thigh there is a focal hematoma identified which measures 5.2 x 3.0 cm in greatest transverse and AP dimensions respectively. It continues for approximately 12 cm in craniocaudad projection along the course of the operative bed. Some hematoma interdigitates between the muscles in the site of previous lipoma. Again no active extravasation or pooling of contrast is noted. Mild subcutaneous edema is noted. The musculature appears otherwise within normal  limits. Bony structures are unremarkable. Visualized pelvic structures are within normal limits. Review of the MIP images confirms the above findings. IMPRESSION: Changes consistent with the recent right thigh lipoma resection. There is a large hematoma identified as described which lies in the anteromedial aspect of the right thigh and interdigitates somewhat between the musculature in the site of previous lipoma. The interdigitation is difficult to assess due to relative isodensity with the adjacent musculature. No active extravasation or pooling of contrast is noted. Mild lower extremity swelling is noted on the right. 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 Anterior tibial artery continues into the foot bilaterally. No significant arterial disease is noted. Electronically Signed   By: Inez Catalina M.D.   On: 07/26/2021 23:18    Pertinent labs & imaging results that were available during my care of the patient were reviewed by me and considered in my medical decision making (see MDM for details).  Medications Ordered in ED Medications  morphine 4 MG/ML injection 4 mg (4 mg Intravenous Given 07/26/21 2207)  ondansetron (ZOFRAN) injection 4 mg (4 mg Intravenous Given 07/26/21 2207)  iohexol (OMNIPAQUE) 350 MG/ML injection 100 mL (100 mLs Intravenous Contrast Given 07/26/21 2253)  morphine 2 MG/ML injection 2 mg (2 mg Intravenous Given 07/26/21 2356)  ondansetron (ZOFRAN)  injection 4 mg (4 mg Intravenous Given 07/26/21 2354)                                                                                                                                     Procedures Procedures  (including critical care time)  Medical Decision Making / ED Course   This patient presents to the ED for concern of surgical site pain, this involves an extensive number of treatment options,  and is a complaint that carries with it a high risk of complications and morbidity.  The differential diagnosis includes postsurgical hematoma, postsurgical infection, compartment syndrome, retained foreign body  MDM: Patient seen emergency department for evaluation of a suspected surgical site hematoma.  Physical exam reveals tenderness at the right thigh surgical site with scant bleeding coming from the incisional site.  Initial laboratory evaluation with a hemoglobin of 9.4 which is down approximately 2 g from yesterday.  Leukocytosis to 10.9, mild hypokalemia to 3.4, AST 99, ALT 53.  CT angio of the leg with large hematoma but no active blush.  General surgery consulted who came and evaluated the patient at bedside who stated that they placed admission orders to general surgery.  I followed up with Dr. Marlou Starks who states that these orders have been placed and as I am unable to find them I placed temporary admission orders to the patient's primary surgeon Dr. Kieth Brightly.  There is a repeat hemoglobin scheduled for the morning.  Patient then admitted to surgery.   Additional history obtained: -Additional history obtained from husband -External records from outside source obtained and reviewed including: Chart review including previous notes, labs, imaging, consultation notes   Lab Tests: -I ordered, reviewed, and interpreted labs.   The pertinent results include:   Labs Reviewed  COMPREHENSIVE METABOLIC PANEL - Abnormal; Notable for the following components:      Result Value   Potassium 3.4 (*)    Glucose, Bld 202 (*)    Calcium 8.2 (*)    Total Protein 5.8 (*)    Albumin 3.1 (*)    AST 99 (*)    ALT 53 (*)    All other components within normal limits  CBC WITH DIFFERENTIAL/PLATELET - Abnormal; Notable for the following components:   WBC 10.9 (*)    RBC 3.11 (*)    Hemoglobin 9.4 (*)    HCT 29.2 (*)    All other components within normal limits  CBC       Imaging Studies ordered: I  ordered imaging studies including CT angio leg I independently visualized and interpreted imaging. I agree with the radiologist interpretation   Medicines ordered and prescription drug management: Meds ordered this encounter  Medications   morphine 4 MG/ML injection 4 mg   ondansetron (ZOFRAN) injection 4 mg   iohexol (OMNIPAQUE) 350 MG/ML injection 100 mL   morphine 2 MG/ML injection 2 mg   ondansetron (ZOFRAN) injection 4  mg    -I have reviewed the patients home medicines and have made adjustments as needed  Critical interventions none  Consultations Obtained: I requested consultation with the General surgery,  and discussed lab and imaging findings as well as pertinent plan - they recommend: admission and repeat AM CBC   Cardiac Monitoring: The patient was maintained on a cardiac monitor.  I personally viewed and interpreted the cardiac monitored which showed an underlying rhythm of: NSr  Social Determinants of Health:  Factors impacting patients care include: none   Reevaluation: After the interventions noted above, I reevaluated the patient and found that they have :improved  Co morbidities that complicate the patient evaluation  Past Medical History:  Diagnosis Date   Anemia    Cyst    drained from Rt breast   Diabetes mellitus    type 2   Fibromyalgia    Herniated disc    Hypertension    Migraines    Wears glasses       Dispostion: I considered admission for this patient, and due to her postoperative hematoma she will be admitted.     Final Clinical Impression(s) / ED Diagnoses Final diagnoses:  Hematoma     @PCDICTATION @    Teressa Lower, MD 07/27/21 [phone]

## 2021-07-26 NOTE — Progress Notes (Signed)
Transition of Care [facility]) Screening Note  Patient Details  Name: Samantha Price Date of Birth: 09/25/1960  Transition of Care [facility]) CM/SW Contact:    Sherie Don, LCSW Phone Number: 07/26/2021, 10:58 AM  Transition of Care Department [facility]) has reviewed patient and no TOC needs have been identified at this time. We will continue to monitor patient advancement through interdisciplinary progression rounds. If new patient transition needs arise, please place a TOC consult.

## 2021-07-26 NOTE — ED Triage Notes (Signed)
Pt reports bleeding from L thigh and lipoma removal, was d/c from the hospital earlier today. When she got home and was trying to elevate her leg, she noticed the swelling. Bleeding from incision site as well. EMS reported hypotension, gave 500 cc NS.

## 2021-07-26 NOTE — Discharge Summary (Signed)
Physician Discharge Summary  Samantha Price JQB:[id number] DOB: [birth year] DOA: 07/23/2021  PCP: Hali Marry, MD  Admit date: 07/23/2021 Discharge date: 07/26/2021  Recommendations for Outpatient Follow-up:   (include homehealth, outpatient follow-up instructions, specific recommendations for PCP to follow-up on, etc.)   Follow-up Information     Jyles Sontag, Arta Bruce, MD Follow up in 3 week(s).   Specialty: General Surgery Contact information: [address] [place] [address] [phone]                Discharge Diagnoses:  Principal Problem:   Lipoma of right thigh   Surgical Procedure: lipoma excision of thigh  Discharge Condition: Good Disposition: Home  Diet recommendation: diabetic diet   Hospital Course:  61 yo female underwent lipoma excision of left thigh for recurrent lipoma. Post op she was admitted to the hospital. She had some bleeding POD 0 that resolved with ace bandage. She continued to have pain control issues and was discharged home POD 3.  Discharge Instructions  Discharge Instructions     Call MD for:  difficulty breathing, headache or visual disturbances   Complete by: As directed    Call MD for:  persistant nausea and vomiting   Complete by: As directed    Call MD for:  redness, tenderness, or signs of infection (pain, swelling, redness, odor or green/yellow discharge around incision site)   Complete by: As directed    Call MD for:  severe uncontrolled pain   Complete by: As directed    Call MD for:  temperature >100.4   Complete by: As directed    Diet - low sodium heart healthy   Complete by: As directed    Discharge wound care:   Complete by: As directed    Remove bandaids tomorrow. Ok to shower tomorrow  Steristrips will likely peel off in 1-3 weeks   Increase activity slowly   Complete by: As directed    Lifting restrictions   Complete by: As directed    Do not lift more than 20 pounds for 3-4  weeks      Allergies as of 07/26/2021       Reactions   Latex Rash        Medication List     TAKE these medications    AMBULATORY NON FORMULARY MEDICATION Medication Name: Glucometer with strips and lancets to test 2-3 time per week.   aspirin 81 MG tablet Take 81 mg by mouth daily.   ibuprofen 800 MG tablet Commonly known as: ADVIL Take 1 tablet (800 mg total) by mouth every 8 (eight) hours as needed.   MULTIVITAMIN PO Take 1 tablet by mouth daily.   nitroGLYCERIN 0.4 MG SL tablet Commonly known as: NITROSTAT Place 1 tablet (0.4 mg total) under the tongue every 5 (five) minutes as needed for chest pain.   oxyCODONE 5 MG immediate release tablet Commonly known as: Oxy IR/ROXICODONE Take 1 tablet (5 mg total) by mouth every 6 (six) hours as needed for severe pain.   rosuvastatin 5 MG tablet Commonly known as: Crestor Take 1 tablet (5 mg total) by mouth at bedtime.   valsartan-hydrochlorothiazide 320-25 MG tablet Commonly known as: DIOVAN-HCT Take 1 tablet by mouth daily.   Xigduo XR 10-998 MG Tb24 Generic drug: Dapagliflozin-metFORMIN HCl ER Take 2 tablets by mouth once daily What changed:  how much to take when to take this               Discharge  Care Instructions  (From admission, onward)           Start     Ordered   07/23/21 0000  Discharge wound care:       Comments: Remove bandaids tomorrow. Ok to shower tomorrow  Steristrips will likely peel off in 1-3 weeks   07/23/21 0841            Follow-up Information     Tahara Ruffini, Arta Bruce, MD Follow up in 3 week(s).   Specialty: General Surgery Contact information: [address] [place] [address] [phone]                  The results of significant diagnostics from this hospitalization (including imaging, microbiology, ancillary and laboratory) are listed below for reference.    Significant Diagnostic Studies: No results found.  Labs: Basic  Metabolic Panel: Recent Labs  Lab 07/20/21 0845 07/23/21 1223  NA 138  --   K 3.6  --   CL 103  --   CO2 27  --   GLUCOSE 134*  --   BUN 21  --   CREATININE 0.96 0.67  CALCIUM 9.3  --    Liver Function Tests: No results for input(s): AST, ALT, ALKPHOS, BILITOT, PROT, ALBUMIN in the last 168 hours.  CBC: Recent Labs  Lab 07/20/21 0845 07/23/21 1223 07/25/21 0424  WBC 6.0 7.2 9.8  HGB 13.6 12.1 11.3*  HCT 42.5 38.8 35.2*  MCV 92.6 96.0 93.4  PLT 290 247 271    CBG: Recent Labs  Lab 07/20/21 0817 07/23/21 0524 07/23/21 0842  GLUCAP 159* 161* 159*    Principal Problem:   Lipoma of right thigh   Time coordinating discharge: 15 min

## 2021-07-26 NOTE — Plan of Care (Signed)
Instructions were reviewed with patient. All questions were answered. Patient was transported to main entrance by wheelchair. ° °

## 2021-07-26 NOTE — H&P (Signed)
Samantha Price is an 61 y.o. female.   Chief Complaint: swelling HPI: The patient is a 61 year old female who is about 3 days postop from an excision of a lipoma from the right thigh.  Her postoperative course was complicated by a hematoma that required her to stay for 2 to 3 days.  She was discharged this morning.  She states since discharge she has developed some more swelling and tenderness in the right thigh.  She denies any fevers or chills.  She denies any dizziness.  She did have one episode of vomiting earlier today.  Past Medical History:  Diagnosis Date   Anemia    Cyst    drained from Rt breast   Diabetes mellitus    type 2   Fibromyalgia    Herniated disc    Hypertension    Migraines    Wears glasses     Past Surgical History:  Procedure Laterality Date   APPENDECTOMY     breast cyst excison     Left,    COLONOSCOPY W/ POLYPECTOMY     2007, Dr. Oletta Lamas.     LIPOMA EXCISION Right 07/23/2021   Procedure: EXCISION LIPOMA RIGHT THIGH;  Surgeon: Kinsinger, Arta Bruce, MD;  Location: [facility];  Service: General;  Laterality: Right;   TUBAL LIGATION      Family History  Problem Relation Age of Onset   Diabetes Mother    Cancer Father        stomach   Hypertrophic cardiomyopathy Brother    Colon cancer Other        aunt, cousin   Heart disease Other        family hisatory   Leukemia Other        1st cousin   Breast cancer Other        aunt   Stroke Other        Grandparent   Social History:  reports that she has never smoked. She has never used smokeless tobacco. She reports current alcohol use. She reports current drug use.  Allergies:  Allergies  Allergen Reactions   Latex Rash    (Not in a hospital admission)   Results for orders placed or performed during the hospital encounter of 07/26/21 (from the past 48 hour(s))  Comprehensive metabolic panel     Status: Abnormal   Collection Time: 07/26/21 10:05 PM  Result Value Ref Range   Sodium 136 135 -  145 mmol/L   Potassium 3.4 (L) 3.5 - 5.1 mmol/L   Chloride 102 98 - 111 mmol/L   CO2 28 22 - 32 mmol/L   Glucose, Bld 202 (H) 70 - 99 mg/dL    Comment: Glucose reference range applies only to samples taken after fasting for at least 8 hours.   BUN 20 8 - 23 mg/dL   Creatinine, Ser 0.79 0.44 - 1.00 mg/dL   Calcium 8.2 (L) 8.9 - 10.3 mg/dL   Total Protein 5.8 (L) 6.5 - 8.1 g/dL   Albumin 3.1 (L) 3.5 - 5.0 g/dL   AST 99 (H) 15 - 41 U/L   ALT 53 (H) 0 - 44 U/L   Alkaline Phosphatase 73 38 - 126 U/L   Total Bilirubin 0.6 0.3 - 1.2 mg/dL   GFR, Estimated >60 >60 mL/min    Comment: (NOTE) Calculated using the CKD-EPI Creatinine Equation (2021)    Anion gap 6 5 - 15    Comment: Performed at [facility], [address]  [place], [place] [address]  CBC with Differential     Status: Abnormal   Collection Time: 07/26/21 10:05 PM  Result Value Ref Range   WBC 10.9 (H) 4.0 - 10.5 K/uL   RBC 3.11 (L) 3.87 - 5.11 MIL/uL   Hemoglobin 9.4 (L) 12.0 - 15.0 g/dL   HCT 29.2 (L) 36.0 - 46.0 %   MCV 93.9 80.0 - 100.0 fL   MCH 30.2 26.0 - 34.0 pg   MCHC 32.2 30.0 - 36.0 g/dL   RDW 11.9 11.5 - 15.5 %   Platelets 245 150 - 400 K/uL   nRBC 0.0 0.0 - 0.2 %   Neutrophils Relative % 69 %   Neutro Abs 7.7 1.7 - 7.7 K/uL   Lymphocytes Relative 22 %   Lymphs Abs 2.4 0.7 - 4.0 K/uL   Monocytes Relative 6 %   Monocytes Absolute 0.6 0.1 - 1.0 K/uL   Eosinophils Relative 1 %   Eosinophils Absolute 0.2 0.0 - 0.5 K/uL   Basophils Relative 1 %   Basophils Absolute 0.1 0.0 - 0.1 K/uL   Immature Granulocytes 1 %   Abs Immature Granulocytes 0.06 0.00 - 0.07 K/uL    Comment: Performed at [facility], [place] [address] [place], [place] [address]   No results found.  Review of Systems  Constitutional: Negative.   HENT: Negative.    Eyes: Negative.   Respiratory: Negative.    Cardiovascular:  Positive for leg swelling.  Gastrointestinal: Negative.   Endocrine: Negative.    Genitourinary: Negative.   Musculoskeletal: Negative.   Skin: Negative.   Allergic/Immunologic: Negative.   Neurological: Negative.   Hematological: Negative.   Psychiatric/Behavioral: Negative.     Blood pressure (!) 139/50, pulse 67, temperature 98.2 F (36.8 C), temperature source Oral, resp. rate 18, last menstrual period 06/28/2011, SpO2 99 %. Physical Exam Constitutional:      General: She is not in acute distress.    Appearance: Normal appearance.  HENT:     Head: Normocephalic and atraumatic.     Right Ear: External ear normal.     Left Ear: External ear normal.     Nose: Nose normal.     Mouth/Throat:     Mouth: Mucous membranes are moist.     Pharynx: Oropharynx is clear. No oropharyngeal exudate.  Eyes:     General: No scleral icterus.    Extraocular Movements: Extraocular movements intact.     Conjunctiva/sclera: Conjunctivae normal.     Pupils: Pupils are equal, round, and reactive to light.  Cardiovascular:     Rate and Rhythm: Normal rate and regular rhythm.     Pulses: Normal pulses.     Heart sounds: Normal heart sounds. No murmur heard. Pulmonary:     Effort: Pulmonary effort is normal. No respiratory distress.     Breath sounds: Normal breath sounds.  Abdominal:     General: Abdomen is flat. Bowel sounds are normal.     Palpations: Abdomen is soft.  Musculoskeletal:        General: Swelling and tenderness present. Normal range of motion.     Cervical back: Normal range of motion and neck supple. No tenderness.  Skin:    General: Skin is warm and dry.     Coloration: Skin is not jaundiced.  Neurological:     General: No focal deficit present.     Mental Status: She is alert and oriented to person, place, and time.  Psychiatric:  Mood and Affect: Mood normal.        Behavior: Behavior normal.        Thought Content: Thought content normal.     Assessment/Plan The patient appears to have developed a hematoma in the right thigh at the site  of a previous lipoma removal about 3 days ago.  She has a small amount of blood oozing from the inferior portion of the incision.  The thigh itself is soft and not tense.  The rest of the incision looks good.  At this point we will readmit her to the hospital and monitor her serial hemoglobins.  She is scheduled for a CT angio of the thigh to look for active extravasation.  We will discuss it with Dr. Kieth Brightly in the morning so that he can continue to keep an eye on her.  Autumn Messing III, MD 07/26/2021, 10:50 PM

## 2021-07-27 ENCOUNTER — Encounter (HOSPITAL_COMMUNITY): Payer: Self-pay | Admitting: General Surgery

## 2021-07-27 LAB — CBC
HCT: 27.5 % — ABNORMAL LOW (ref 36.0–46.0)
Hemoglobin: 8.9 g/dL — ABNORMAL LOW (ref 12.0–15.0)
MCH: 30.4 pg (ref 26.0–34.0)
MCHC: 32.4 g/dL (ref 30.0–36.0)
MCV: 93.9 fL (ref 80.0–100.0)
Platelets: 247 10*3/uL (ref 150–400)
RBC: 2.93 MIL/uL — ABNORMAL LOW (ref 3.87–5.11)
RDW: 12 % (ref 11.5–15.5)
WBC: 8.8 10*3/uL (ref 4.0–10.5)
nRBC: 0 % (ref 0.0–0.2)

## 2021-07-27 LAB — SURGICAL PATHOLOGY

## 2021-07-27 LAB — RESP PANEL BY RT-PCR (FLU A&B, COVID) ARPGX2
Influenza A by PCR: NEGATIVE
Influenza B by PCR: NEGATIVE
SARS Coronavirus 2 by RT PCR: NEGATIVE

## 2021-07-27 LAB — HIV ANTIBODY (ROUTINE TESTING W REFLEX): HIV Screen 4th Generation wRfx: NONREACTIVE

## 2021-07-27 MED ORDER — IBUPROFEN 800 MG PO TABS
800.0000 mg | ORAL_TABLET | Freq: Three times a day (TID) | ORAL | Status: DC | PRN
  Administered 2021-07-28 – 2021-07-29 (×2): 800 mg via ORAL
  Filled 2021-07-27 (×2): qty 1

## 2021-07-27 MED ORDER — ONDANSETRON 4 MG PO TBDP: 4.0000 mg | ORAL_TABLET | Freq: Four times a day (QID) | ORAL | Status: DC | PRN

## 2021-07-27 MED ORDER — INFLUENZA VAC SPLIT QUAD 0.5 ML IM SUSY
0.5000 mL | PREFILLED_SYRINGE | INTRAMUSCULAR | Status: AC
  Administered 2021-07-28: 0.5 mL via INTRAMUSCULAR
  Filled 2021-07-27: qty 0.5

## 2021-07-27 MED ORDER — ACETAMINOPHEN 500 MG PO TABS: 1000.0000 mg | ORAL_TABLET | Freq: Four times a day (QID) | ORAL | Status: DC | PRN

## 2021-07-27 MED ORDER — METOPROLOL TARTRATE 5 MG/5ML IV SOLN: 5.0000 mg | Freq: Four times a day (QID) | INTRAVENOUS | Status: DC | PRN

## 2021-07-27 MED ORDER — DIPHENHYDRAMINE HCL 12.5 MG/5ML PO ELIX: 12.5000 mg | ORAL_SOLUTION | Freq: Four times a day (QID) | ORAL | Status: DC | PRN

## 2021-07-27 MED ORDER — ACETAMINOPHEN 325 MG PO TABS
650.0000 mg | ORAL_TABLET | Freq: Four times a day (QID) | ORAL | Status: DC | PRN
  Administered 2021-07-28 (×2): 650 mg via ORAL
  Filled 2021-07-27 (×2): qty 2

## 2021-07-27 MED ORDER — DIPHENHYDRAMINE HCL 50 MG/ML IJ SOLN
12.5000 mg | Freq: Four times a day (QID) | INTRAMUSCULAR | Status: DC | PRN
Start: 2021-07-27 — End: 2021-07-29

## 2021-07-27 MED ORDER — ONDANSETRON HCL 4 MG/2ML IJ SOLN: 4.0000 mg | Freq: Four times a day (QID) | INTRAMUSCULAR | Status: DC | PRN

## 2021-07-27 MED ORDER — OXYCODONE HCL 5 MG PO TABS
5.0000 mg | ORAL_TABLET | ORAL | Status: DC | PRN
  Administered 2021-07-27 – 2021-07-29 (×9): 10 mg via ORAL
  Filled 2021-07-27 (×9): qty 2

## 2021-07-27 MED ORDER — ACETAMINOPHEN 650 MG RE SUPP: 650.0000 mg | Freq: Four times a day (QID) | RECTAL | Status: DC | PRN

## 2021-07-27 MED ORDER — MORPHINE SULFATE (PF) 2 MG/ML IV SOLN
2.0000 mg | INTRAVENOUS | Status: DC | PRN
  Administered 2021-07-27 – 2021-07-29 (×6): 2 mg via INTRAVENOUS
  Filled 2021-07-27 (×6): qty 1

## 2021-07-27 NOTE — Progress Notes (Signed)
Wrapped leg incision sight with pressure dressing per MD.  Serious drainage only.  Also gave pain medications.

## 2021-07-27 NOTE — Progress Notes (Signed)
°  Transition of Care [facility]) Screening Note   Patient Details  Name: AMONIE WISSER Date of Birth: 23-Aug-1960   Transition of Care [facility]) CM/SW Contact:    Sybol Morre, Marjie Skiff, RN Phone Number: 07/27/2021, 2:08 PM    Transition of Care Department [facility]) has reviewed patient and no TOC needs have been identified at this time. We will continue to monitor patient advancement through interdisciplinary progression rounds. If new patient transition needs arise, please place a TOC consult.

## 2021-07-27 NOTE — Progress Notes (Signed)
Chief Complaint/Subjective: Bled last night and came in to ED. Continued pain in right thigh, concerned about swelling  Objective: Vital signs in last 24 hours: Temp:  [98.2 F (36.8 C)] 98.2 F (36.8 C) (01/30 2119) Pulse Rate:  [58-79] 79 (01/31 1000) Resp:  [16-19] 18 (01/31 1000) BP: (98-189)/(50-90) 108/90 (01/31 1000) SpO2:  [92 %-99 %] 95 % (01/31 1000)   Intake/Output from previous day: No intake/output data recorded. Intake/Output this shift: No intake/output data recorded.  PE: Gen: NAd Resp: nonlabored Card: RRR Ext: right thigh swollen with some ecchymosis, no active drainage, tender  Lab Results:  Recent Labs    07/26/21 2205 07/27/21 0433  WBC 10.9* 8.8  HGB 9.4* 8.9*  HCT 29.2* 27.5*  PLT 245 247   BMET Recent Labs    07/26/21 2205  NA 136  K 3.4*  CL 102  CO2 28  GLUCOSE 202*  BUN 20  CREATININE 0.79  CALCIUM 8.2*   PT/INR No results for input(s): LABPROT, INR in the last 72 hours. CMP     Component Value Date/Time   NA 136 07/26/2021 2205   K 3.4 (L) 07/26/2021 2205   CL 102 07/26/2021 2205   CO2 28 07/26/2021 2205   GLUCOSE 202 (H) 07/26/2021 2205   BUN 20 07/26/2021 2205   CREATININE 0.79 07/26/2021 2205   CREATININE 0.96 01/18/2021 0000   CALCIUM 8.2 (L) 07/26/2021 2205   PROT 5.8 (L) 07/26/2021 2205   ALBUMIN 3.1 (L) 07/26/2021 2205   AST 99 (H) 07/26/2021 2205   ALT 53 (H) 07/26/2021 2205   ALKPHOS 73 07/26/2021 2205   BILITOT 0.6 07/26/2021 2205   GFRNONAA >60 07/26/2021 2205   GFRNONAA 73 03/06/2020 1045   GFRAA 85 03/06/2020 1045   Lipase  No results found for: LIPASE  Studies/Results: CT ANGIO LOW EXTREM LEFT W &/OR WO CONTRAST  Result Date: 07/26/2021 CLINICAL DATA:  History of recent right thigh lipoma excision with pain and swelling in the operative site, evaluate for active hemorrhage/hematoma EXAM: CT ANGIOGRAPHY OF THE BILATERAL LOWER EXTREMITIES TECHNIQUE: Multidetector CT imaging of the bilateral  lower extremitieswas performed using the standard protocol during bolus administration of intravenous contrast. Multiplanar CT image reconstructions and MIPs were obtained to evaluate the vascular anatomy. RADIATION DOSE REDUCTION: This exam was performed according to the departmental dose-optimization program which includes automated exposure control, adjustment of the mA and/or kV according to patient size and/or use of iterative reconstruction technique. CONTRAST:  149mL OMNIPAQUE IOHEXOL 350 MG/ML SOLN COMPARISON:  MRI from 02/26/2021 FINDINGS: Vascular: Distal abdominal aorta is within normal limits. The iliac arteries bilaterally are within normal limits. Left lower extremity: No significant atherosclerotic changes are noted. The posterior tibial artery on the left is diminutive with collateral flow from the peroneal identified. The anterior tibial continues into the foot. Right lower extremity one superficial femoral artery and popliteal artery are well visualized and within normal limits. Popliteal trifurcation is patent although the posterior tibial artery occludes shortly after its origin. Collateral flow from the peroneal to the distal posterior tibial artery is noted. The anterior tibial artery continues into the foot. There are no findings to suggest acute hemorrhage in the operative bed. No areas of active extravasation are seen. No pooling of contrast is noted. Vascular: In the operative bed in the medial and anterior right thigh there is a focal hematoma identified which measures 5.2 x 3.0 cm in greatest transverse and AP dimensions respectively. It continues for  approximately 12 cm in craniocaudad projection along the course of the operative bed. Some hematoma interdigitates between the muscles in the site of previous lipoma. Again no active extravasation or pooling of contrast is noted. Mild subcutaneous edema is noted. The musculature appears otherwise within normal limits. Bony structures are  unremarkable. Visualized pelvic structures are within normal limits. Review of the MIP images confirms the above findings. IMPRESSION: Changes consistent with the recent right thigh lipoma resection. There is a large hematoma identified as described which lies in the anteromedial aspect of the right thigh and interdigitates somewhat between the musculature in the site of previous lipoma. The interdigitation is difficult to assess due to relative isodensity with the adjacent musculature. No active extravasation or pooling of contrast is noted. Mild lower extremity swelling is noted on the right. 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 Anterior tibial artery continues into the foot bilaterally. No significant arterial disease is noted. Electronically Signed   By: Inez Catalina M.D.   On: 07/26/2021 23:18   CT ANGIO LOW EXTREM RIGHT W &/OR WO CONTRAST  Result Date: 07/26/2021 CLINICAL DATA:  History of recent right thigh lipoma excision with pain and swelling in the operative site, evaluate for active hemorrhage/hematoma EXAM: CT ANGIOGRAPHY OF THE BILATERAL LOWER EXTREMITIES TECHNIQUE: Multidetector CT imaging of the bilateral lower extremitieswas performed using the standard protocol during bolus administration of intravenous contrast. Multiplanar CT image reconstructions and MIPs were obtained to evaluate the vascular anatomy. RADIATION DOSE REDUCTION: This exam was performed according to the departmental dose-optimization program which includes automated exposure control, adjustment of the mA and/or kV according to patient size and/or use of iterative reconstruction technique. CONTRAST:  141mL OMNIPAQUE IOHEXOL 350 MG/ML SOLN COMPARISON:  MRI from 02/26/2021 FINDINGS: Vascular: Distal abdominal aorta is within normal limits. The iliac arteries bilaterally are within normal  limits. Left lower extremity: No significant atherosclerotic changes are noted. The posterior tibial artery on the left is diminutive with collateral flow from the peroneal identified. The anterior tibial continues into the foot. Right lower extremity one superficial femoral artery and popliteal artery are well visualized and within normal limits. Popliteal trifurcation is patent although the posterior tibial artery occludes shortly after its origin. Collateral flow from the peroneal to the distal posterior tibial artery is noted. The anterior tibial artery continues into the foot. There are no findings to suggest acute hemorrhage in the operative bed. No areas of active extravasation are seen. No pooling of contrast is noted. Vascular: In the operative bed in the medial and anterior right thigh there is a focal hematoma identified which measures 5.2 x 3.0 cm in greatest transverse and AP dimensions respectively. It continues for approximately 12 cm in craniocaudad projection along the course of the operative bed. Some hematoma interdigitates between the muscles in the site of previous lipoma. Again no active extravasation or pooling of contrast is noted. Mild subcutaneous edema is noted. The musculature appears otherwise within normal limits. Bony structures are unremarkable. Visualized pelvic structures are within normal limits. Review of the MIP images confirms the above findings. IMPRESSION: Changes consistent with the recent right thigh lipoma resection. There is a large hematoma identified as described which lies in the anteromedial aspect of the right thigh and interdigitates somewhat between the musculature in the site of previous lipoma. The interdigitation is difficult to assess due to relative isodensity with the adjacent musculature. No  active extravasation or pooling of contrast is noted. Mild lower extremity swelling is noted on the right. 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 Anterior tibial artery continues into the foot bilaterally. No significant arterial disease is noted. Electronically Signed   By: Inez Catalina M.D.   On: 07/26/2021 23:18    Anti-infectives: Anti-infectives (From admission, onward)    None       Assessment/Plan  s/p   right thigh lipoma readmitted with complication of hematoma FEN - reg diet VTE - scds only ID - no issues Disposition - admit, compress thigh with ace   LOS: 1 day   I reviewed last 24 h vitals and pain scores, last 48 h intake and output, last 24 h labs and trends, and last 24 h imaging results.  This care required moderate level of medical decision making.   [facility] 07/27/2021, 10:44 AM Please see Amion for pager number during day hours 7:00am-4:30pm or 7:00am -11:30am on weekends

## 2021-07-27 NOTE — ED Notes (Signed)
Patients dressing site clean and dry.  Patient provided with supplies to brush teeth per request.

## 2021-07-27 NOTE — Progress Notes (Signed)
Rt thigh sight clean dry intact.  Pulses are +1 pedal in rt foot. Spoke with patient about Surgery coming to evaluate and make discission of surgery vs monitoring sight.

## 2021-07-28 LAB — CBC
HCT: 26.1 % — ABNORMAL LOW (ref 36.0–46.0)
Hemoglobin: 8.2 g/dL — ABNORMAL LOW (ref 12.0–15.0)
MCH: 30.5 pg (ref 26.0–34.0)
MCHC: 31.4 g/dL (ref 30.0–36.0)
MCV: 97 fL (ref 80.0–100.0)
Platelets: 236 10*3/uL (ref 150–400)
RBC: 2.69 MIL/uL — ABNORMAL LOW (ref 3.87–5.11)
RDW: 12.2 % (ref 11.5–15.5)
WBC: 7.9 10*3/uL (ref 4.0–10.5)
nRBC: 0 % (ref 0.0–0.2)

## 2021-07-28 LAB — HEMOGLOBIN AND HEMATOCRIT, BLOOD
HCT: 27.2 % — ABNORMAL LOW (ref 36.0–46.0)
Hemoglobin: 8.7 g/dL — ABNORMAL LOW (ref 12.0–15.0)

## 2021-07-28 NOTE — Progress Notes (Signed)
Chief Complaint/Subjective: Continued pain, swelling stable, no bleeding overnight  Objective: Vital signs in last 24 hours: Temp:  [97.8 F (36.6 C)-100.4 F (38 C)] 98.4 F (36.9 C) (02/01 1356) Pulse Rate:  [74-78] 78 (02/01 1356) Resp:  [16-18] 16 (02/01 1356) BP: (117-148)/(55-74) 148/74 (02/01 1356) SpO2:  [93 %-95 %] 93 % (02/01 1356) Last BM Date: 07/27/21 Intake/Output from previous day: 01/31 0701 - 02/01 0700 In: 240 [P.O.:240] Out: -  Intake/Output this shift: No intake/output data recorded.  PE: Gen: NAD Resp: no dyspnea Card: RRR Abd: soft Ext: edema less, no bleeding, ace in place  Lab Results:  Recent Labs    07/27/21 0433 07/28/21 0526 07/28/21 1230  WBC 8.8 7.9  --   HGB 8.9* 8.2* 8.7*  HCT 27.5* 26.1* 27.2*  PLT 247 236  --    BMET Recent Labs    07/26/21 2205  NA 136  K 3.4*  CL 102  CO2 28  GLUCOSE 202*  BUN 20  CREATININE 0.79  CALCIUM 8.2*   PT/INR No results for input(s): LABPROT, INR in the last 72 hours. CMP     Component Value Date/Time   NA 136 07/26/2021 2205   K 3.4 (L) 07/26/2021 2205   CL 102 07/26/2021 2205   CO2 28 07/26/2021 2205   GLUCOSE 202 (H) 07/26/2021 2205   BUN 20 07/26/2021 2205   CREATININE 0.79 07/26/2021 2205   CREATININE 0.96 01/18/2021 0000   CALCIUM 8.2 (L) 07/26/2021 2205   PROT 5.8 (L) 07/26/2021 2205   ALBUMIN 3.1 (L) 07/26/2021 2205   AST 99 (H) 07/26/2021 2205   ALT 53 (H) 07/26/2021 2205   ALKPHOS 73 07/26/2021 2205   BILITOT 0.6 07/26/2021 2205   GFRNONAA >60 07/26/2021 2205   GFRNONAA 73 03/06/2020 1045   GFRAA 85 03/06/2020 1045   Lipase  No results found for: LIPASE  Studies/Results: CT ANGIO LOW EXTREM LEFT W &/OR WO CONTRAST  Result Date: 07/26/2021 CLINICAL DATA:  History of recent right thigh lipoma excision with pain and swelling in the operative site, evaluate for active hemorrhage/hematoma EXAM: CT ANGIOGRAPHY OF THE BILATERAL LOWER EXTREMITIES TECHNIQUE:  Multidetector CT imaging of the bilateral lower extremitieswas performed using the standard protocol during bolus administration of intravenous contrast. Multiplanar CT image reconstructions and MIPs were obtained to evaluate the vascular anatomy. RADIATION DOSE REDUCTION: This exam was performed according to the departmental dose-optimization program which includes automated exposure control, adjustment of the mA and/or kV according to patient size and/or use of iterative reconstruction technique. CONTRAST:  124mL OMNIPAQUE IOHEXOL 350 MG/ML SOLN COMPARISON:  MRI from 02/26/2021 FINDINGS: Vascular: Distal abdominal aorta is within normal limits. The iliac arteries bilaterally are within normal limits. Left lower extremity: No significant atherosclerotic changes are noted. The posterior tibial artery on the left is diminutive with collateral flow from the peroneal identified. The anterior tibial continues into the foot. Right lower extremity one superficial femoral artery and popliteal artery are well visualized and within normal limits. Popliteal trifurcation is patent although the posterior tibial artery occludes shortly after its origin. Collateral flow from the peroneal to the distal posterior tibial artery is noted. The anterior tibial artery continues into the foot. There are no findings to suggest acute hemorrhage in the operative bed. No areas of active extravasation are seen. No pooling of contrast is noted. Vascular: In the operative bed in the medial and anterior right thigh there is a focal hematoma identified which measures 5.2  x 3.0 cm in greatest transverse and AP dimensions respectively. It continues for approximately 12 cm in craniocaudad projection along the course of the operative bed. Some hematoma interdigitates between the muscles in the site of previous lipoma. Again no active extravasation or pooling of contrast is noted. Mild subcutaneous edema is noted. The musculature appears otherwise  within normal limits. Bony structures are unremarkable. Visualized pelvic structures are within normal limits. Review of the MIP images confirms the above findings. IMPRESSION: Changes consistent with the recent right thigh lipoma resection. There is a large hematoma identified as described which lies in the anteromedial aspect of the right thigh and interdigitates somewhat between the musculature in the site of previous lipoma. The interdigitation is difficult to assess due to relative isodensity with the adjacent musculature. No active extravasation or pooling of contrast is noted. Mild lower extremity swelling is noted on the right. 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 Anterior tibial artery continues into the foot bilaterally. No significant arterial disease is noted. Electronically Signed   By: Inez Catalina M.D.   On: 07/26/2021 23:18   CT ANGIO LOW EXTREM RIGHT W &/OR WO CONTRAST  Result Date: 07/26/2021 CLINICAL DATA:  History of recent right thigh lipoma excision with pain and swelling in the operative site, evaluate for active hemorrhage/hematoma EXAM: CT ANGIOGRAPHY OF THE BILATERAL LOWER EXTREMITIES TECHNIQUE: Multidetector CT imaging of the bilateral lower extremitieswas performed using the standard protocol during bolus administration of intravenous contrast. Multiplanar CT image reconstructions and MIPs were obtained to evaluate the vascular anatomy. RADIATION DOSE REDUCTION: This exam was performed according to the departmental dose-optimization program which includes automated exposure control, adjustment of the mA and/or kV according to patient size and/or use of iterative reconstruction technique. CONTRAST:  147mL OMNIPAQUE IOHEXOL 350 MG/ML SOLN COMPARISON:  MRI from 02/26/2021 FINDINGS: Vascular: Distal abdominal aorta is within normal limits. The iliac  arteries bilaterally are within normal limits. Left lower extremity: No significant atherosclerotic changes are noted. The posterior tibial artery on the left is diminutive with collateral flow from the peroneal identified. The anterior tibial continues into the foot. Right lower extremity one superficial femoral artery and popliteal artery are well visualized and within normal limits. Popliteal trifurcation is patent although the posterior tibial artery occludes shortly after its origin. Collateral flow from the peroneal to the distal posterior tibial artery is noted. The anterior tibial artery continues into the foot. There are no findings to suggest acute hemorrhage in the operative bed. No areas of active extravasation are seen. No pooling of contrast is noted. Vascular: In the operative bed in the medial and anterior right thigh there is a focal hematoma identified which measures 5.2 x 3.0 cm in greatest transverse and AP dimensions respectively. It continues for approximately 12 cm in craniocaudad projection along the course of the operative bed. Some hematoma interdigitates between the muscles in the site of previous lipoma. Again no active extravasation or pooling of contrast is noted. Mild subcutaneous edema is noted. The musculature appears otherwise within normal limits. Bony structures are unremarkable. Visualized pelvic structures are within normal limits. Review of the MIP images confirms the above findings. IMPRESSION: Changes consistent with the recent right thigh lipoma resection. There is a large hematoma identified as described which lies in the anteromedial aspect of the right thigh and interdigitates somewhat between the musculature in the site of previous lipoma. The interdigitation  is difficult to assess due to relative isodensity with the adjacent musculature. No active extravasation or pooling of contrast is noted. Mild lower extremity swelling is noted on the right. 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 Anterior tibial artery continues into the foot bilaterally. No significant arterial disease is noted. Electronically Signed   By: Inez Catalina M.D.   On: 07/26/2021 23:18    Anti-infectives: Anti-infectives (From admission, onward)    None       Assessment/Plan  s/p     Lipoma excisions s/p hematoma and bleeding -recheck labs today show Hgb stable -home in morning   LOS: 1 day   I reviewed last 24 h vitals and pain scores, last 48 h intake and output, last 24 h labs and trends, and last 24 h imaging results.  This care required moderate level of medical decision making.   [facility] 07/28/2021, 3:55 PM Please see Amion for pager number during day hours 7:00am-4:30pm or 7:00am -11:30am on weekends

## 2021-07-28 NOTE — Progress Notes (Signed)
°   07/28/21 1130  Mobility  Activity Ambulated with assistance in hallway  Range of Motion/Exercises Active;All extremities  Level of Assistance Contact guard assist, steadying assist  Assistive Device Front wheel walker  Activity Response Tolerated well  $Mobility charge 1 Mobility   Pt agreeable to mobilize this mornin. Ambulated about 94ft in hall with RW, tolerated well. Pt noted some LH on the return trip. Had RN bring pt's recliner to the hall so pt could sit. Rolled pt back to room, and assisted her back to bed. Left pt in bed, call bell at side. RN/NT notified of session.   [place] [facility] Office: [phone]

## 2021-07-29 ENCOUNTER — Other Ambulatory Visit (HOSPITAL_COMMUNITY): Payer: Self-pay

## 2021-07-29 LAB — CBC
HCT: 26.4 % — ABNORMAL LOW (ref 36.0–46.0)
Hemoglobin: 8.2 g/dL — ABNORMAL LOW (ref 12.0–15.0)
MCH: 29.9 pg (ref 26.0–34.0)
MCHC: 31.1 g/dL (ref 30.0–36.0)
MCV: 96.4 fL (ref 80.0–100.0)
Platelets: 251 10*3/uL (ref 150–400)
RBC: 2.74 MIL/uL — ABNORMAL LOW (ref 3.87–5.11)
RDW: 12.5 % (ref 11.5–15.5)
WBC: 6.1 10*3/uL (ref 4.0–10.5)
nRBC: 0 % (ref 0.0–0.2)

## 2021-07-29 MED ORDER — POLYETHYLENE GLYCOL 3350 17 G PO PACK
17.0000 g | PACK | Freq: Every day | ORAL | 0 refills | Status: AC
  Filled 2021-07-29: qty 14, 14d supply, fill #0

## 2021-07-29 MED ORDER — POLYETHYLENE GLYCOL 3350 17 G PO PACK
17.0000 g | PACK | Freq: Every day | ORAL | Status: DC
  Administered 2021-07-29: 17 g via ORAL
  Filled 2021-07-29: qty 1

## 2021-07-29 MED ORDER — POTASSIUM CHLORIDE 20 MEQ PO PACK
20.0000 meq | PACK | Freq: Once | ORAL | Status: AC
  Administered 2021-07-29: 20 meq via ORAL
  Filled 2021-07-29: qty 1

## 2021-07-29 MED ORDER — FLEET ENEMA 7-19 GM/118ML RE ENEM: 1.0000 | ENEMA | Freq: Every day | RECTAL | Status: DC | PRN

## 2021-07-29 MED ORDER — BISACODYL 10 MG RE SUPP
10.0000 mg | Freq: Once | RECTAL | Status: AC
  Administered 2021-07-29: 10 mg via RECTAL
  Filled 2021-07-29: qty 1

## 2021-07-29 NOTE — Progress Notes (Signed)
Surgical site dressing changed this morning,no bleeding but noted swollen, tenderness and redness. Pain managing with morphine and oxycodone. Patient got little hard stool, c/o constipation.

## 2021-07-29 NOTE — Progress Notes (Signed)
°   07/29/21 1000  Mobility  Activity Ambulated with assistance in hallway  Level of Assistance Contact guard assist, steadying assist  Assistive Device Front wheel walker  Distance Ambulated (ft) 70 ft  Activity Response Tolerated well  $Mobility charge 1 Mobility   Pt agreeable to mobilize this morning. Ambulated about 79ft in hall with RW, toleated well. Pt noted 10/10 pain while ambulating but insisted to continue with session. Otherwise no complaints. Left pt in bed, call bell at side. RN/NT notified of session.   [place] [facility] Office: [phone]

## 2021-07-29 NOTE — Progress Notes (Signed)
°   07/29/21 1200  Mobility  Activity Ambulated with assistance in hallway  Level of Assistance Standby assist, set-up cues, supervision of patient - no hands on  Assistive Device Front wheel walker  Distance Ambulated (ft) 90 ft  Activity Response Tolerated well  $Mobility charge 1 Mobility   Pt agreeable to mobilize this afternoon for a second session. Ambulated about 13ft in hall with RW, tolerated well. She still complains of 10/10 upper right leg pain. Otherwise no complaints. Left pt in bed, call bell at side. RN/NT notified of session.   [place] [facility] Office: [phone]

## 2021-09-03 NOTE — Discharge Summary (Signed)
°  Physician Discharge Summary  AFTEN LIPSEY IOE:[id number] DOB: [birth year] DOA: 07/26/2021  PCP: Hali Marry, MD  Admit date: 07/26/2021 Discharge date: 09/03/2021  Recommendations for Outpatient Follow-up:   (include homehealth, outpatient follow-up instructions, specific recommendations for PCP to follow-up on, etc.)   Discharge Diagnoses:  Principal Problem:   Hematoma   Surgical Procedure: none  Discharge Condition: Good Disposition: Home  Diet recommendation: regular diet   Hospital Course:  61 yo female came in 3 days after lipoma excision with increased swelling. She was found to have a large hematoma in the surgical bed. It was treated with compression and improved. She was discharged home HD 3.  Discharge Instructions   Allergies as of 07/29/2021       Reactions   Latex Rash        Medication List     TAKE these medications    AMBULATORY NON FORMULARY MEDICATION Medication Name: Glucometer with strips and lancets to test 2-3 time per week.   aspirin 81 MG tablet Take 81 mg by mouth daily.   ibuprofen 800 MG tablet Commonly known as: ADVIL Take 1 tablet (800 mg total) by mouth every 8 (eight) hours as needed. What changed: reasons to take this   MULTIVITAMIN PO Take 1 tablet by mouth daily.   nitroGLYCERIN 0.4 MG SL tablet Commonly known as: NITROSTAT Place 1 tablet (0.4 mg total) under the tongue every 5 (five) minutes as needed for chest pain.   oxyCODONE 5 MG immediate release tablet Commonly known as: Oxy IR/ROXICODONE Take 1 tablet (5 mg total) by mouth every 6 (six) hours as needed for severe pain.   polyethylene glycol 17 g packet Commonly known as: MIRALAX / GLYCOLAX Mix 17 g (1 capful) with 6-8 ounces of liquid and take by mouth daily.   rosuvastatin 5 MG tablet Commonly known as: Crestor Take 1 tablet (5 mg total) by mouth at bedtime.   valsartan-hydrochlorothiazide 320-25 MG tablet Commonly known as:  DIOVAN-HCT Take 1 tablet by mouth daily.   Xigduo XR 10-998 MG Tb24 Generic drug: Dapagliflozin-metFORMIN HCl ER Take 2 tablets by mouth once daily What changed:  how much to take when to take this          The results of significant diagnostics from this hospitalization (including imaging, microbiology, ancillary and laboratory) are listed below for reference.    Significant Diagnostic Studies: No results found.  Labs: Basic Metabolic Panel: No results for input(s): NA, K, CL, CO2, GLUCOSE, BUN, CREATININE, CALCIUM, MG, PHOS in the last 168 hours. Liver Function Tests: No results for input(s): AST, ALT, ALKPHOS, BILITOT, PROT, ALBUMIN in the last 168 hours.  CBC: No results for input(s): WBC, NEUTROABS, HGB, HCT, MCV, PLT in the last 168 hours.  CBG: No results for input(s): GLUCAP in the last 168 hours.  Principal Problem:   Hematoma   Time coordinating discharge: 15 min

## 2021-11-12 ENCOUNTER — Telehealth: Payer: Self-pay | Admitting: Family Medicine

## 2021-11-12 NOTE — Telephone Encounter (Signed)
Call patient to schedule.  She is overdue for her diabetes care as well as her Pap smear.

## 2021-11-12 NOTE — Telephone Encounter (Signed)
Spoke with patient at 3:32. Per patient she will call back next week to schedule she need to check her calendar first. gh
